# Patient Record
Sex: Male | Born: 1935
Health system: Southern US, Community
[De-identification: ages and names within clinical notes are randomized; demographics above are authoritative.]

## PROBLEM LIST (undated history)

## (undated) DIAGNOSIS — H547 Unspecified visual loss: Secondary | ICD-10-CM

## (undated) DIAGNOSIS — E119 Type 2 diabetes mellitus without complications: Secondary | ICD-10-CM

## (undated) DIAGNOSIS — I10 Essential (primary) hypertension: Secondary | ICD-10-CM

## (undated) DIAGNOSIS — F0390 Unspecified dementia without behavioral disturbance: Secondary | ICD-10-CM

## (undated) DIAGNOSIS — H409 Unspecified glaucoma: Secondary | ICD-10-CM

---

## 1999-04-22 ENCOUNTER — Encounter: Payer: Self-pay | Admitting: Cardiology

## 1999-04-22 ENCOUNTER — Ambulatory Visit (HOSPITAL_COMMUNITY): Admission: RE | Admit: 1999-04-22 | Discharge: 1999-04-22 | Payer: Self-pay | Admitting: Cardiology

## 2001-03-31 ENCOUNTER — Inpatient Hospital Stay (HOSPITAL_COMMUNITY): Admission: EM | Admit: 2001-03-31 | Discharge: 2001-04-01 | Payer: Self-pay | Admitting: Emergency Medicine

## 2001-03-31 ENCOUNTER — Encounter: Payer: Self-pay | Admitting: Emergency Medicine

## 2003-04-20 ENCOUNTER — Encounter (INDEPENDENT_AMBULATORY_CARE_PROVIDER_SITE_OTHER): Payer: Self-pay | Admitting: Specialist

## 2003-04-20 ENCOUNTER — Ambulatory Visit (HOSPITAL_COMMUNITY): Admission: RE | Admit: 2003-04-20 | Discharge: 2003-04-20 | Payer: Self-pay | Admitting: Gastroenterology

## 2004-12-06 ENCOUNTER — Encounter: Admission: RE | Admit: 2004-12-06 | Discharge: 2004-12-06 | Payer: Self-pay | Admitting: Internal Medicine

## 2004-12-07 ENCOUNTER — Encounter: Admission: RE | Admit: 2004-12-07 | Discharge: 2004-12-07 | Payer: Self-pay | Admitting: Internal Medicine

## 2006-05-08 ENCOUNTER — Encounter: Payer: Self-pay | Admitting: Vascular Surgery

## 2006-05-08 ENCOUNTER — Ambulatory Visit (HOSPITAL_COMMUNITY): Admission: RE | Admit: 2006-05-08 | Discharge: 2006-05-08 | Payer: Self-pay | Admitting: Internal Medicine

## 2006-05-08 ENCOUNTER — Ambulatory Visit: Payer: Self-pay | Admitting: Vascular Surgery

## 2006-07-27 ENCOUNTER — Emergency Department (HOSPITAL_COMMUNITY): Admission: EM | Admit: 2006-07-27 | Discharge: 2006-07-27 | Payer: Self-pay | Admitting: Emergency Medicine

## 2006-09-03 ENCOUNTER — Ambulatory Visit (HOSPITAL_COMMUNITY): Admission: RE | Admit: 2006-09-03 | Discharge: 2006-09-03 | Payer: Self-pay | Admitting: Gastroenterology

## 2006-09-03 ENCOUNTER — Encounter (INDEPENDENT_AMBULATORY_CARE_PROVIDER_SITE_OTHER): Payer: Self-pay | Admitting: Gastroenterology

## 2010-07-19 NOTE — Op Note (Signed)
NAME:  Grant Jensen, Grant Jensen                ACCOUNT NO.:  [id number]   MEDICAL RECORD NO.:  [id number]          PATIENT TYPE:  AMB   LOCATION:  ENDO                         FACILITY:  [facility]   PHYSICIAN:  Anselmo Rod, M.D.  DATE OF BIRTH:  October 08, 1928   DATE OF PROCEDURE:  09/03/2006  DATE OF DISCHARGE:                               OPERATIVE REPORT   PROCEDURE PERFORMED:  Colonoscopy with cold snare x2.   ENDOSCOPIST:  Anselmo Rod, M.D.   INSTRUMENT USED:  Pentax video colonoscope.   INDICATIONS FOR PROCEDURE:  A 77 year old [place] American male with a  history of colon cancer in his brother, undergoing screening colonoscopy  to rule out colonic polyps, masses, etc.   PREPROCEDURE PREPARATION:  Informed consent was procured from the  patient. The patient fasted for 4 hours prior to procedure and prepped  with MoviPrep the night of and the morning of the procedure.  Risks and  benefits of the procedure, including a 10% miss rate of cancer and  polyp, were discussed with the patient as well.   PREPROCEDURE PHYSICAL EXAMINATION:  VITAL SIGNS:  The patient had stable  vital signs.  NECK:  Supple.  CHEST:  Clear to auscultation.  CARDIOVASCULAR:  S1, S2 regular.  ABDOMEN:  Soft, with normal bowel sounds.   DESCRIPTION OF THE PROCEDURE:  The patient was placed in the left  lateral decubitus position and sedated with 100 mcg of Fentanyl and 10  mg of Versed given intravenously in slow incremental doses. Once the  patient was adequately sedated and maintained on low-flow oxygen and  continuous cardiac monitoring, the Pentax video colonoscope was advanced  from the rectum to the cecum with difficulty.  The patient had a  significant amount of residual stool in the colon.  Multiple washes were  done.  The appendiceal orifice and cecal valve were visualized after  changing the patient's position from the left lateral to the supine and  the right lateral position.  The terminal ileum was  not visualized. Two  small flat polyps were removed by cold snare from the cecum.  There was  no evidence of diverticulosis. Retroflexion in the rectum revealed  prominent internal hemorrhoids.  The patient tolerated the procedure  well, without complications. Small lesions could be missed secondary to  a relatively poor prep.   IMPRESSION:  1. Two small sessile polyps removed by cold snare from the cecum.  2. Prominent internal hemorrhoids.  3. Large amount of residual stool in the colon.  Small lesions could      be missed.   RECOMMENDATIONS:  1. Await pathology results.  2. Repeat colonoscopy depending on pathology results.  3. Avoid all nonsteroidals including aspirin for the next 2 weeks.  4. Outpatient followup as need arises in the future.      Anselmo Rod, M.D.  Electronically Signed     JNM/MEDQ  D:  09/03/2006  T:  09/03/2006  Job:  [id number]   cc:   Thora Lance, M.D.  Fax: [phone]

## 2010-07-22 NOTE — Discharge Summary (Signed)
[facility]  Patient:    BECKHEM, ISADORE Visit Number: [id number] MRN: [id number]          Service Type: MED Location: [phone] Attending Physician:  Eliezer Champagne Dictated by:   Wayne C. Dorna Bloom, M.D. Admit Date:  03/31/2001 Discharge Date: 04/01/2001   CC:         Wayne C. Dorna Bloom, M.D.   Discharge Summary  DISCHARGE DIAGNOSES: 1. Left sided ureteral stone. 2. Hypertension. 3. Hematuria. 4. Glaucoma. 5. Hypokalemia. 6. Anemia.  HISTORY OF PRESENT ILLNESS:  The patient is a pleasant 77 year old gentleman who was admitted on 03/31/01 after severe left lower quadrant pain radiating to the groin followed by vomiting times one with slight blood, no urinary difficulties.  The patient has a history of kidney stone 30 years ago.  Other problems include hypertension.  The patient was initially seen in the emergency room, was afebrile with vital signs fairly stable.  Abdominal CT scan revealed a left ureteral stone 3 mm, in the distal ureter, fullness in the left renal collecting system and left perinephric stranding.  HOSPITAL COURSE:  The patient was admitted for pain control, intravenous morphine after which the pain resolved.  At this point this morning the patient is not having further pain and feels great.  He has not passed the stone as far as he knows.  The patient will be discharged with Vicodin as needed for pain.  FOLLOW UP:  Patient will have follow up in the office in about two weeks and the patient already has a previously scheduled appointment.  If the patient develops any intractable pain, unrelenting vomiting or fever he is to come in sooner.  For right now we will hold off on a Urology referral but the patient may ultimately need a referral.  We will be getting full reports on the stone as well.  Other issues include hypertension.  The patient previously had been on Altace 2.5 mg q.d.  Blood pressure on admission was 155/82 and this  morning is 145/81.  Considering the patients current problems this is reasonable.  Next issue was anemia.  The patient presented with a hemoglobin of 12.5 with hematocrit 38.6.  On 04/01/01 the hemoglobin is 11.4 and hematocrit is 34.4. This is most likely a result of his hematuria but can simply be followed.  Next problem is hypokalemia.  The patient had potassium of 2.7 on admission and after intravenous fluids and potassium supplement this morning his potassium is normal at 3.7 and renal function, sodium and other electrolytes were normal. Dictated by:   Wayne C. Dorna Bloom, M.D. Attending Physician:  Eliezer Champagne DD:  04/01/01 TD:  04/01/01 Job: [id number]

## 2010-07-22 NOTE — Op Note (Signed)
NAME:  Grant Jensen, Grant Jensen                            ACCOUNT NO.:  [id number]   MEDICAL RECORD NO.:  [id number]                   PATIENT TYPE:  AMB   LOCATION:  ENDO                                 FACILITY:  [facility]   PHYSICIAN:  Anselmo Rod, M.D.               DATE OF BIRTH:  Sep 18, 1928   DATE OF PROCEDURE:  04/20/2003  DATE OF DISCHARGE:                                 OPERATIVE REPORT   PROCEDURE:  Colonoscopy with cold biopsy x1.   ENDOSCOPIST:  Anselmo Rod, M.D.   INSTRUMENT USED:  Olympus video colonoscope.   INDICATIONS FOR PROCEDURE:  This 77 year old African-American male with a  family history of colon cancer in a brother, underwent a screening  colonoscopy to rule out colonic polyps, masses, etc.   PRE-PROCEDURE PREPARATION:  An informed consent was procured from the  patient.  The patient was fasted for eight hours prior to the procedure, and  then prepped with a bottle of magnesium citrate and one gallon of GoLYTELY  on the night prior to the procedure.   PRE-PROCEDURE PHYSICAL EXAMINATION:  VITAL SIGNS:  Stable.  NECK:  Supple.  CHEST:  Clear to auscultation.  HEART:  S1, S2 regular.  ABDOMEN:  Soft with normal bowel sounds.   DESCRIPTION OF PROCEDURE:  The patient was placed in the left lateral  decubitus position and sedated with 80 mg of Demerol and 8 mg of Versed in  slow incremental doses.  Once the patient was adequately sedated and  maintained on low-flow oxygen and continuous cardiac monitoring, the Olympus  video colonoscope was advanced from the rectum to the cecum.  A small  sessile polyp was biopsied from 75 cm x1.  Small internal hemorrhoids were  seen on retroflexion.  The rest of the colonic mucosa and cecum appeared  normal.  There was some residual stool in the colon.  Small lesions could  have been missed.   IMPRESSION:  1. Small non-bleeding internal hemorrhoids.  2. Small sessile polyp, biopsied at 75 cm.  Otherwise an unrevealing  colonoscopy.   RECOMMENDATIONS:  1. Await the pathology results.  2. Avoid all non-steroidals including aspirin for the next two weeks.  3. Outpatient followup in the next two weeks for further recommendations.  4. Repeat CRC screening, depending upon the pathology results.                                               Anselmo Rod, M.D.    JNM/MEDQ  D:  04/20/2003  T:  04/20/2003  Job:  [id number]   cc:   Lilla Shook, M.D.  [address], [address]  [place]  [place] [address]  Fax: [phone]

## 2012-01-18 ENCOUNTER — Other Ambulatory Visit: Payer: Self-pay | Admitting: Internal Medicine

## 2012-01-18 DIAGNOSIS — R531 Weakness: Secondary | ICD-10-CM

## 2012-01-22 ENCOUNTER — Ambulatory Visit
Admission: RE | Admit: 2012-01-22 | Discharge: 2012-01-22 | Disposition: A | Discharge status: Home / Self Care | Payer: Medicare Other | Source: Ambulatory Visit | Attending: Internal Medicine | Admitting: Internal Medicine

## 2012-01-22 DIAGNOSIS — R531 Weakness: Secondary | ICD-10-CM

## 2012-12-19 ENCOUNTER — Ambulatory Visit
Admission: RE | Admit: 2012-12-19 | Discharge: 2012-12-19 | Disposition: A | Discharge status: Home / Self Care | Payer: Medicare PPO | Source: Ambulatory Visit | Attending: Internal Medicine | Admitting: Internal Medicine

## 2012-12-19 ENCOUNTER — Other Ambulatory Visit: Payer: Self-pay | Admitting: Internal Medicine

## 2012-12-19 DIAGNOSIS — E119 Type 2 diabetes mellitus without complications: Secondary | ICD-10-CM

## 2014-05-08 ENCOUNTER — Encounter (HOSPITAL_COMMUNITY): Payer: Self-pay | Admitting: Emergency Medicine

## 2014-05-08 ENCOUNTER — Emergency Department (HOSPITAL_COMMUNITY): Payer: Medicare PPO

## 2014-05-08 ENCOUNTER — Emergency Department (HOSPITAL_COMMUNITY)
Admission: EM | Admit: 2014-05-08 | Discharge: 2014-05-08 | Discharge status: Against Medical Advice | Payer: Medicare PPO | Attending: Emergency Medicine | Admitting: Emergency Medicine

## 2014-05-08 DIAGNOSIS — Z79899 Other long term (current) drug therapy: Secondary | ICD-10-CM | POA: Insufficient documentation

## 2014-05-08 DIAGNOSIS — R0602 Shortness of breath: Secondary | ICD-10-CM | POA: Insufficient documentation

## 2014-05-08 DIAGNOSIS — R079 Chest pain, unspecified: Secondary | ICD-10-CM | POA: Insufficient documentation

## 2014-05-08 DIAGNOSIS — E119 Type 2 diabetes mellitus without complications: Secondary | ICD-10-CM | POA: Insufficient documentation

## 2014-05-08 DIAGNOSIS — Z8669 Personal history of other diseases of the nervous system and sense organs: Secondary | ICD-10-CM | POA: Diagnosis not present

## 2014-05-08 DIAGNOSIS — Z7982 Long term (current) use of aspirin: Secondary | ICD-10-CM | POA: Diagnosis not present

## 2014-05-08 DIAGNOSIS — R6883 Chills (without fever): Secondary | ICD-10-CM | POA: Insufficient documentation

## 2014-05-08 HISTORY — DX: Unspecified glaucoma: H40.9

## 2014-05-08 HISTORY — DX: Type 2 diabetes mellitus without complications: E11.9

## 2014-05-08 LAB — BASIC METABOLIC PANEL
ANION GAP: 10 (ref 5–15)
BUN: 9 mg/dL (ref 6–23)
CO2: 27 mmol/L (ref 19–32)
Calcium: 9.3 mg/dL (ref 8.4–10.5)
Chloride: 101 mmol/L (ref 96–112)
Creatinine, Ser: 1.17 mg/dL (ref 0.50–1.35)
GFR calc Af Amer: 64 mL/min — ABNORMAL LOW (ref >90)
GFR, EST NON AFRICAN AMERICAN: 55 mL/min — AB (ref >90)
Glucose, Bld: 139 mg/dL — ABNORMAL HIGH (ref 70–99)
POTASSIUM: 3.7 mmol/L (ref 3.5–5.1)
SODIUM: 138 mmol/L (ref 135–145)

## 2014-05-08 LAB — I-STAT TROPONIN, ED: Troponin i, poc: 0.01 ng/mL (ref 0.00–0.08)

## 2014-05-08 LAB — CBC
HCT: 36.6 % — ABNORMAL LOW (ref 39.0–52.0)
HEMOGLOBIN: 11.9 g/dL — AB (ref 13.0–17.0)
MCH: 27 pg (ref 26.0–34.0)
MCHC: 32.5 g/dL (ref 30.0–36.0)
MCV: 83 fL (ref 78.0–100.0)
Platelets: 188 10*3/uL (ref 150–400)
RBC: 4.41 MIL/uL (ref 4.22–5.81)
RDW: 14.7 % (ref 11.5–15.5)
WBC: 6.7 10*3/uL (ref 4.0–10.5)

## 2014-05-08 MED ORDER — ASPIRIN 81 MG PO CHEW
324.0000 mg | CHEWABLE_TABLET | Freq: Once | ORAL | Status: AC
  Administered 2014-05-08: 324 mg via ORAL
  Filled 2014-05-08: qty 4

## 2014-05-08 NOTE — ED Notes (Signed)
Pt aware of need for urine specimen. 

## 2014-05-08 NOTE — ED Provider Notes (Signed)
CSN: [id number]     Arrival date & time 05/08/14  1640 History   First MD Initiated Contact with Patient 05/08/14 1910     Chief Complaint  Patient presents with  . Chest Pain     (Consider location/radiation/quality/duration/timing/severity/associated sxs/prior Treatment) Patient is a 81 y.o. male presenting with chest pain. The history is provided by the patient and the spouse. No language interpreter was used.  Chest Pain Associated symptoms: shortness of breath   Associated symptoms: no abdominal pain, no cough, no dizziness, no fever, no headache, no nausea, not vomiting and no weakness   Mr. Aden is a 81 y/o black male who has a history of diabetes and presents today for sharp, acute onset chest pain, shortness of breath, and diaphoresis that began 5 days ago. His wife states the pain was 10/10 when it began but has subsided slightly each day. He was at his PCP, Dr. [name] today with continued chest pain so she sent him to the ED. Nothing makes the pain better or worse. He has not taken his aspirin today. He denies any nausea, vomiting, abdominal pain, leg swelling.  Past Medical History  Diagnosis Date  . Glaucoma   . Diabetes mellitus without complication    History reviewed. No pertinent past surgical history. No family history on file. History  Substance Use Topics  . Smoking status: Never Smoker   . Smokeless tobacco: Not on file  . Alcohol Use: Yes    Review of Systems  Constitutional: Positive for chills. Negative for fever.  Respiratory: Positive for shortness of breath. Negative for cough.   Cardiovascular: Positive for chest pain.  Gastrointestinal: Negative for nausea, vomiting, abdominal pain and blood in stool.  Neurological: Negative for dizziness, syncope, weakness, light-headedness and headaches.  All other systems reviewed and are negative.     Allergies  Lisinopril  Home Medications   Prior to Admission medications   Medication Sig Start Date  End Date Taking? Authorizing Provider  aspirin EC 81 MG tablet Take 81 mg by mouth daily.   Yes Historical Provider, MD  metFORMIN (GLUCOPHAGE) 500 MG tablet Take 500 mg by mouth daily with breakfast.   Yes Historical Provider, MD  omeprazole (PRILOSEC) 20 MG capsule Take 20 mg by mouth daily.   Yes Historical Provider, MD   BP 148/80 mmHg  Pulse 83  Temp(Src) 98.4 F (36.9 C)  Resp 15  Ht  (1.626 m)  Wt 160 lb (72.576 kg)  BMI 27.45 kg/m2  SpO2 100% Physical Exam  Constitutional: He is oriented to person, place, and time. He appears well-developed and well-nourished.  HENT:  Head: Normocephalic and atraumatic.  Right Ear: Tympanic membrane and external ear normal.  Left Ear: Tympanic membrane and external ear normal.  Mouth/Throat: Oropharynx is clear and moist and mucous membranes are normal.  Eyes: Conjunctivae are normal.  Neck: Normal range of motion. Neck supple.  Cardiovascular: Normal rate, regular rhythm and normal heart sounds.   Pulmonary/Chest: Effort normal and breath sounds normal. No respiratory distress. He has no decreased breath sounds. He has no wheezes. He has no rales.  No reproducible chest pain.  Abdominal: Soft. There is no tenderness.  Musculoskeletal: Normal range of motion.  Neurological: He is alert and oriented to person, place, and time.  Skin: Skin is warm and dry.  Nursing note and vitals reviewed.   ED Course  Procedures (including critical care time) Labs Review Labs Reviewed  CBC - Abnormal; Notable for the following:  Hemoglobin 11.9 (*)    HCT 36.6 (*)    All other components within normal limits  BASIC METABOLIC PANEL - Abnormal; Notable for the following:    Glucose, Bld 139 (*)    GFR calc non Af Amer 55 (*)    GFR calc Af Amer 64 (*)    All other components within normal limits  URINALYSIS, ROUTINE W REFLEX MICROSCOPIC  I-STAT TROPOININ, ED    Imaging Review Dg Chest 2 View  05/08/2014   CLINICAL DATA:  Chest pain for  4 days.  Initial encounter.  EXAM: CHEST  2 VIEW  COMPARISON:  12/09/2012.  FINDINGS: Cardiopericardial silhouette within normal limits. Unchanged tortuous thoracic aorta. Bilateral glenohumeral osteoarthritis incidentally noted. Trachea midline. No airspace disease or effusion.  IMPRESSION: No active cardiopulmonary disease.   Electronically Signed   By: Andreas Newport M.D.   On: 05/08/2014 18:11     EKG Interpretation   Date/Time:  Friday May 08 2014 16:43:23 EST Ventricular Rate:  91 PR Interval:  162 QRS Duration: 160 QT Interval:  402 QTC Calculation: 494 R Axis:   -58 Text Interpretation:  Normal sinus rhythm Left ventricular hypertrophy  with QRS widening and repolarization abnormality Abnormal ECG Confirmed by  Lincoln Brigham [phone]) on 05/08/2014 8:13:51 PM      MDM   Final diagnoses:  None  CBC and BMP is within normal limits. Troponin negative.   CXR shows no acute abnormality. EKG shows no acute coronary syndrome. Vitals are normal.  The patient has never had a stress test or ECHO.  He has no cardiac history but he is diabetic with a family history of heart disease.  Dr. Madilyn Hook and I checked the HEART score for major cardiac events and recommended the patient be admitted.    20:24 Spoke to patient and wife regarding admission and patient does not want to stay.  Thoroughly discussed the risks of having a cardiac event including death.  Dr. Madilyn Hook also thoroughly discussed the risks of leaving and the patient would like to leave AMA.  He was told that he could return at any time and to definitely return for chest pain, shortness of breath, or abdominal pain.  He is to call cardiology tomorrow for an appointment and take aspirin 81mg  daily.       Catha Gosselin, PA-C 05/09/14 1414  Tilden Fossa, MD 05/09/14 1452

## 2014-05-08 NOTE — ED Notes (Signed)
Pt returned to lobby from having xray completed.

## 2014-05-08 NOTE — Discharge Instructions (Signed)
Chest Pain (Nonspecific) °It is often hard to give a specific diagnosis for the cause of chest pain. There is always a chance that your pain could be related to something serious, such as a heart attack or a blood clot in the lungs. You need to follow up with your health care provider for further evaluation. °CAUSES  °· Heartburn. °· Pneumonia or bronchitis. °· Anxiety or stress. °· Inflammation around your heart (pericarditis) or lung (pleuritis or pleurisy). °· A blood clot in the lung. °· A collapsed lung (pneumothorax). It can develop suddenly on its own (spontaneous pneumothorax) or from trauma to the chest. °· Shingles infection (herpes zoster virus). °The chest wall is composed of bones, muscles, and cartilage. Any of these can be the source of the pain. °· The bones can be bruised by injury. °· The muscles or cartilage can be strained by coughing or overwork. °· The cartilage can be affected by inflammation and become sore (costochondritis). °DIAGNOSIS  °Lab tests or other studies may be needed to find the cause of your pain. Your health care provider may have you take a test called an ambulatory electrocardiogram (ECG). An ECG records your heartbeat patterns over a 24-hour period. You may also have other tests, such as: °· Transthoracic echocardiogram (TTE). During echocardiography, sound waves are used to evaluate how blood flows through your heart. °· Transesophageal echocardiogram (TEE). °· Cardiac monitoring. This allows your health care provider to monitor your heart rate and rhythm in real time. °· Holter monitor. This is a portable device that records your heartbeat and can help diagnose heart arrhythmias. It allows your health care provider to track your heart activity for several days, if needed. °· Stress tests by exercise or by giving medicine that makes the heart beat faster. °TREATMENT  °· Treatment depends on what may be causing your chest pain. Treatment may include: °¨ Acid blockers for  heartburn. °¨ Anti-inflammatory medicine. °¨ Pain medicine for inflammatory conditions. °¨ Antibiotics if an infection is present. °· You may be advised to change lifestyle habits. This includes stopping smoking and avoiding alcohol, caffeine, and chocolate. °· You may be advised to keep your head raised (elevated) when sleeping. This reduces the chance of acid going backward from your stomach into your esophagus. °Most of the time, nonspecific chest pain will improve within 2-3 days with rest and mild pain medicine.  °HOME CARE INSTRUCTIONS  °· If antibiotics were prescribed, take them as directed. Finish them even if you start to feel better. °· For the next few days, avoid physical activities that bring on chest pain. Continue physical activities as directed. °· Do not use any tobacco products, including cigarettes, chewing tobacco, or electronic cigarettes. °· Avoid drinking alcohol. °· Only take medicine as directed by your health care provider. °· Follow your health care provider's suggestions for further testing if your chest pain does not go away. °· Keep any follow-up appointments you made. If you do not go to an appointment, you could develop lasting (chronic) problems with pain. If there is any problem keeping an appointment, call to reschedule. °SEEK MEDICAL CARE IF:  °· Your chest pain does not go away, even after treatment. °· You have a rash with blisters on your chest. °· You have a fever. °SEEK IMMEDIATE MEDICAL CARE IF:  °· You have increased chest pain or pain that spreads to your arm, neck, jaw, back, or abdomen. °· You have shortness of breath. °· You have an increasing cough, or you cough   up blood.  You have severe back or abdominal pain.  You feel nauseous or vomit.  You have severe weakness.  You faint.  You have chills. This is an emergency. Do not wait to see if the pain will go away. Get medical help at once. Call your local emergency services (911 in [place] Do not drive  yourself to the hospital. MAKE SURE YOU:   Understand these instructions.  Will watch your condition.  Will get help right away if you are not doing well or get worse. Document Released: 11/30/2004 Document Revised: 02/25/2013 Document Reviewed: 09/26/2007 [facility] Patient Information 2015 [place], [place]. This information is not intended to replace advice given to you by your health care provider. Make sure you discuss any questions you have with your health care provider.  Be sure to call cardiology, Dr. Jacinto Halim tomorrow for f/u. Take a baby aspirin 81mg  daily. Return to the ED for chest pain, shortness of breath, abdominal pain, or any other reason.

## 2014-05-08 NOTE — ED Notes (Signed)
Pt presents with intermittent central chest pain onset Monday, admits to shortness of breath at times.  Pt was seen by PCP prior to arrival and instructed to come to ED for evaluation.  Pt denies pain at present.  No acute distress noted.

## 2014-06-20 DIAGNOSIS — R079 Chest pain, unspecified: Secondary | ICD-10-CM

## 2014-06-20 NOTE — H&P (Signed)
OFFICE NOTE COPIED TO EPIC  Grant Jensen 06/09/2014 2:30 PM Location: [facility] Patient #: [id number] DOB: 06/09/1928 Married / Language: English / Race: Refused to Report/Unreported Male    History of Present Illness(Grant Donetta Potts, MD; 06/09/2014 9:03 PM) Patient words: f/u nuc and echo; Pt states that he has been getting a headache after he takes Metoprolol. Pt says headache does not last too long. Pt denies all other symptoms. Pt states he did not get the Isosorbide from the pharmacy so pt. has not been taking it.  The patient is a 81 year old male who presents for a follow-up for Chest pain. Grant Jensen is 81 years old African-American male. He gives history of chest pain off and on for past few months. Pain is felt in the precordial region and sometimes radiates across the anterior chest. He denies radiation to the arms, neck or back of the chest. It is associated with shortness of breath. The pain subsides in few minutes. On 05/04/2014, he had similar but quite severe pain. It was associated with diaphoresis and shortness of breath. He did not go to any doctor that day. The patient saw his PCP four days later. Patient's history was concerning for severe ischemia and thus he was referred to emergency room. I have reviewed the ER records. His troponin I was normal at 0.01. The patient was advised hospital admission for further management but he had refused and signed out AMA. He had occasional mild chest pain since last visit, did not take nitroglycerin any time.  Patient denies any shortness of breath but his activities are limited. He does not go out of the home except to his mailbox which is about 20 yards and he is comfortable walking that distance. No orthopnea or PND. No complaints of palpitation, dizziness, near syncope or syncope. No history of swelling on the legs and no claudication. He has history of GERD.  No history of hypertension.  He has diabetes mellitus type 2. Patient says that his cholesterol was told to be normal. He does not smoke but, has been chewing tobacco for many years.  No history of MI in the past and no known history of CAD. No history of thyroid problems. No history of TIA or CVA.     Problem List/Past Medical([name]; 06/09/2014 1:47 PM) Central obesity (E65) Angina pectoris (I20.9) Glaucoma (H40.9) Controlled type 2 diabetes mellitus (E11.9) GERD (gastroesophageal reflux disease) (K21.9)    Allergies([name]; 06/09/2014 1:47 PM) Lisinopril *ANTIHYPERTENSIVES*. Cough.    Family History([name]; 06/09/2014 1:47 PM) Sister 1. younger Father. Deceased. at age 67, from Natural Causes. No known Heart conditions prior Brother 3. all deceased Mother. Deceased. at age 35, from natural causes. No known Heart conditions prior    Social History([name]; 06/09/2014 1:47 PM) Current tobacco use. Uses chewing tobacco. since age 54 Alcohol Use. Moderate alcohol use. Living Situation. Lives with spouse. Marital status. Married. Number of Children. 5    Past Surgical History([name]; 06/09/2014 1:47 PM) Cataract Extraction-Bilateral. 2001    Medication History([name]; 06/09/2014 1:55 PM) Medications Reconciled. Nitrostat (0.4MG  Tab Sublingual, 1 (one) Tab Sublingual Sublingual every 5 minutes as needed for chest pain., Taken starting 05/12/2014) Active. Metoprolol Succinate ER (  Tablet ER 24HR, 1 (one) Tablet ER 24HR Oral daily, Taken starting 05/12/2014) Active. Isosorbide Mononitrate ER (  Tablet ER 24HR, 1 (one) Tablet ER 24HR Oral daily, Taken 05/12/2014 to 05/12/2014) Discontinued. (Pt never received from the pharmacy.) Aspirin EC Low Dose (   Tablet DR, 1 (one) Tablet DR Oral daily, Taken starting 05/12/2014) Active. MetFORMIN HCl (  Tablet, 1 Oral daily) Active. Omeprazole (  Capsule DR, 1 Oral daily)  Active.    Diagnostic Studies History([name]; 06/09/2014 1:47 PM) Echocardiogram. 06/03/2014 1. Left ventricle cavity is normal in size. Moderate global hypokinesis. Doppler evidence of grade I (impaired) diastolic dysfunction. Calculated EF 30%. 2. Sclerosis of the aortic valve. Trileaflet aortic valve with no regurgitation noted. 3. Trace mitral regurgitation. Mild calcification of the mitral valve annulus. Structurally normal tricuspid valve with trace regurgitation. No evidence of pulmonary hypertension. Nuclear stress test. 05/22/2014 1. The resting electrocardiogram demonstrated normal sinus rhythm and RBBB. LAD, LAFB. Anterior infarct, old. LVH.Stress EKG is non-diagnostic for ischemia as it a pharmacologic stress using Lexiscan. Stress symptoms included dyspnea, dizziness. 2. Myocardial perfusion imaging is abnormal. There is a moderate area of ischemia in the basal inferior and mid inferior myocardial wall(s). Gated SPECT imaging demonstrates hypokinesis of the basal inferior and mid inferior myocardial wall(s). The left ventricular ejection fraction was calculated or visually estimated to be 47%. This represents at least an intermediate risk scan. Colonoscopy. 2012 Removed benign polyps Endoscopy. 2012 Normal    Review of Systems(Grant Donetta Potts, MD; 06/09/2014 9:03 PM)   Note: GENERAL- Feels tired, No fever, chills. No recent weight change. CARDIO VASCULAR- Has chest pain associated with shortness of breath, No orthopnea or PND. No palpitation, dizziness, fainting. No hypertension. No h/o high cholesterol. No swelling on legs. No claudication in legs, No cramps. No h/o DVT PULMONARY- No cough, phlegm, wheezing, not feeling congested in chest. GASTROINTESTINAL- No abdominal pain, nausea, vomiting or diarrhea. No dark tarry stools. Normal appetite. Has heartburn. No jaundice. ENDOCRINE- No Thyroid problem, No feeling of excessive heat or cold, No polydipsia or  polyuria. Has Diabetes. NEUROLOGICAL- No focal motor or sensory symptoms, Good coordination. No seizures. MUSCULOSKELETAL- No generalized myalgias or muscle weakness. No joint swelling SKIN- No skin rash, No pruritus HEMATOLOGY- No anemia, petechiae, excessive bruising, epistaxis, GI bleed or any abnormal bleeding.    Vitals([name]; 06/09/2014 1:58 PM) 06/09/2014 1:47 PM Weight: 166 lb Height: 63 in Body Surface Area: 1.83 m Body Mass Index: 29.41 kg/m Pulse: 80 (Regular) P.OX: 99% (Room air) BP: 132/80 (Sitting, Left Arm, Standard)     Physical Exam(Grant Donetta Potts, MD; 06/09/2014 3:23 PM) The physical exam findings are as follows:  Note: GENERAL APPEARANCE- Alert, Oriented. Has central obesity. HEENT- Unremarkable, fundi were not examined. Pt. is edentulous. NECK- No JVD. Carotid pulses are 2+, No bruits audible. No thyromegaly. No lymphadenopathy. HEART- Palpation- Apex beat is palpable in left 5th intercostal space, inside midclavicular line. No heave or thrills palpable. Auscultation- Normal S1, S2. No gallops. Gr. 2/6 ESM is audible in aortic area.. CHEST- Normal shape. Normal percussion. Auscultation- Normal breath sounds, No crepitations. No wheezing. ABDOMEN- Obese. Palpation- Soft, Nontender. No hepatosplenomegaly. No masses felt. Auscultation- Normal bowel sounds. No bruits audible. EXTREMITIES- No Clubbing or Cyanosis. No edema on legs or feet. PERIPHERAL PULSES- Both femoral pulses- 2+, No bruits audible. Both dorsalis pedis pulses- 2+, Both posterior tibial pulses- 2+    Assessment & Plan(Grant Donetta Potts, MD; 06/11/2014 12:54 PM) Angina pectoris (I20.9) Story: Nuclear stress test. 05/22/2014 1. The resting electrocardiogram demonstrated normal sinus rhythm and RBBB. LAD, LAFB. Anterior infarct, old. LVH.Stress EKG is non-diagnostic for ischemia as it a pharmacologic stress using Lexiscan. Stress symptoms included dyspnea, dizziness. 2.  Myocardial perfusion imaging is abnormal. There is a moderate area of  ischemia in the basal inferior and mid inferior myocardial wall(s). Gated SPECT imaging demonstrates hypokinesis of the basal inferior and mid inferior myocardial wall(s). The left ventricular ejection fraction was calculated or visually estimated to be 47%. This represents at least an intermediate risk scan. Current Plans l Started Atorvastatin Calcium 20MG , 1 (one) Tablet daily, #30, 30 days starting 06/09/2014, Ref. x2. l LIPID PANEL (39030) l METABOLIC PANEL, COMPREHENSIVE (80053) l CBC & PLATELETS (AUTO) (09233) l Pt Education - How to access health information online: discussed with patient and provided information. l Patient screened for tobacco use and received tobacco cessation intervention (counseling, pharmacotherapy, or both), if identified as a tobacco user (PV, CAD) (4004F) l Oral antiplatelet therapy prescribed (CAD) (4011F)  Abnormal myocardial perfusion study (R94.39)  Cardiomyopathy (I42.9) Story: Echocardiogram. 06/03/2014 1. Left ventricle cavity is normal in size. Moderate global hypokinesis. Doppler evidence of grade I (impaired) diastolic dysfunction. Calculated EF 30%. 2. Sclerosis of the aortic valve. Trileaflet aortic valve with no regurgitation noted. 3. Trace mitral regurgitation. Mild calcification of the mitral valve annulus. Structurally normal tricuspid valve with trace regurgitation. No evidence of pulmonary hypertension. Current Plans l Started Losartan Potassium 25MG , 1 (one) Tablet daily, #30, 30 days starting 06/09/2014, Ref. x3.  Encounter for preprocedural cardiovascular examination (Z01.810) Story: Cath 06/22/2014 Current Plans l PT (PROTHROMBIN TIME) (00762)  Chewing tobacco use (Z72.0) Current Plans l Counseling about tobacco use for 1 to 3 minutes (26333)  Note: Results of Lexiscan Myoview scan and echocardiogram were  explained to the patient and his wife. He has inf. wall ischemia by the stress nuclear scans and also has cardiomyopathy. EF by echo. was 30%.  I have recommended cardiac catheterization. We discussed regarding risks, benefits, and alternative of continued medical therapy. Patient wants to proceed. Understands <1-2% risk of death, stroke, MI, urgent CABG, bleeding, infection, renal failure but not limited to these. Pt. verbalized understanding, has been scheduled for cardiac catheterization, and possible angioplasty/stent implant by Dr. Jacinto Halim.  Patient was advised to hold metformin from the day before catheterization.   He will continue metoprolol, low-dose aspirin, and nitroglycerin prn. I have added losartan (patient had cough with lisinopril in the past) and also statin therapy with atorvastatin 20 mg daily.  Primary prevention was again discussed. Patient was advised to follow strict ADA, low-salt, low-cholesterol diet. He was also advised to stop chewing tobacco.  I will see him in follow-up after cardiac cath. and any intervention as needed.  CC: Dr. [name].   Signed electronically by Earl Many, MD (06/11/2014 12:55 PM)

## 2014-06-22 ENCOUNTER — Encounter (HOSPITAL_COMMUNITY): Payer: Self-pay | Admitting: Cardiology

## 2014-06-22 ENCOUNTER — Encounter (HOSPITAL_COMMUNITY)
Admission: RE | Disposition: A | Discharge status: Home / Self Care | Payer: Self-pay | Source: Ambulatory Visit | Attending: Cardiology

## 2014-06-22 ENCOUNTER — Ambulatory Visit (HOSPITAL_COMMUNITY)
Admission: RE | Admit: 2014-06-22 | Discharge: 2014-06-22 | Disposition: A | Discharge status: Home / Self Care | Payer: Medicare PPO | Source: Ambulatory Visit | Attending: Cardiology | Admitting: Cardiology

## 2014-06-22 DIAGNOSIS — Z888 Allergy status to other drugs, medicaments and biological substances status: Secondary | ICD-10-CM | POA: Insufficient documentation

## 2014-06-22 DIAGNOSIS — I10 Essential (primary) hypertension: Secondary | ICD-10-CM | POA: Insufficient documentation

## 2014-06-22 DIAGNOSIS — I38 Endocarditis, valve unspecified: Secondary | ICD-10-CM | POA: Diagnosis not present

## 2014-06-22 DIAGNOSIS — E119 Type 2 diabetes mellitus without complications: Secondary | ICD-10-CM | POA: Diagnosis not present

## 2014-06-22 DIAGNOSIS — E785 Hyperlipidemia, unspecified: Secondary | ICD-10-CM | POA: Insufficient documentation

## 2014-06-22 DIAGNOSIS — R079 Chest pain, unspecified: Secondary | ICD-10-CM | POA: Diagnosis present

## 2014-06-22 DIAGNOSIS — I209 Angina pectoris, unspecified: Secondary | ICD-10-CM | POA: Diagnosis not present

## 2014-06-22 DIAGNOSIS — I429 Cardiomyopathy, unspecified: Secondary | ICD-10-CM | POA: Insufficient documentation

## 2014-06-22 DIAGNOSIS — K219 Gastro-esophageal reflux disease without esophagitis: Secondary | ICD-10-CM | POA: Diagnosis not present

## 2014-06-22 DIAGNOSIS — E65 Localized adiposity: Secondary | ICD-10-CM | POA: Insufficient documentation

## 2014-06-22 DIAGNOSIS — F17210 Nicotine dependence, cigarettes, uncomplicated: Secondary | ICD-10-CM | POA: Insufficient documentation

## 2014-06-22 DIAGNOSIS — I340 Nonrheumatic mitral (valve) insufficiency: Secondary | ICD-10-CM | POA: Diagnosis not present

## 2014-06-22 DIAGNOSIS — Z7982 Long term (current) use of aspirin: Secondary | ICD-10-CM | POA: Insufficient documentation

## 2014-06-22 HISTORY — PX: LEFT HEART CATHETERIZATION WITH CORONARY ANGIOGRAM: SHX5451

## 2014-06-22 LAB — GLUCOSE, CAPILLARY: Glucose-Capillary: 133 mg/dL — ABNORMAL HIGH (ref 70–99)

## 2014-06-22 SURGERY — LEFT HEART CATHETERIZATION WITH CORONARY ANGIOGRAM
Anesthesia: LOCAL

## 2014-06-22 MED ORDER — SODIUM CHLORIDE 0.9 % IJ SOLN: 3.0000 mL | Freq: Two times a day (BID) | INTRAMUSCULAR | Status: DC

## 2014-06-22 MED ORDER — METFORMIN HCL 500 MG PO TABS: 500.0000 mg | ORAL_TABLET | Freq: Every day | ORAL | Status: DC

## 2014-06-22 MED ORDER — FENTANYL CITRATE (PF) 100 MCG/2ML IJ SOLN
INTRAMUSCULAR | Status: AC
  Filled 2014-06-22: qty 2

## 2014-06-22 MED ORDER — SODIUM CHLORIDE 0.9 % IV SOLN: 250.0000 mL | INTRAVENOUS | Status: DC | PRN

## 2014-06-22 MED ORDER — MIDAZOLAM HCL 2 MG/2ML IJ SOLN
INTRAMUSCULAR | Status: AC
  Filled 2014-06-22: qty 2

## 2014-06-22 MED ORDER — LIDOCAINE HCL (PF) 1 % IJ SOLN
INTRAMUSCULAR | Status: AC
Start: 2014-06-22 — End: 2014-06-22
  Filled 2014-06-22: qty 30

## 2014-06-22 MED ORDER — SODIUM CHLORIDE 0.9 % IV SOLN
INTRAVENOUS | Status: DC
  Administered 2014-06-22: 06:00:00 via INTRAVENOUS

## 2014-06-22 MED ORDER — SODIUM CHLORIDE 0.9 % IJ SOLN: 3.0000 mL | INTRAMUSCULAR | Status: DC | PRN

## 2014-06-22 MED ORDER — HEPARIN SODIUM (PORCINE) 1000 UNIT/ML IJ SOLN
INTRAMUSCULAR | Status: AC
  Filled 2014-06-22: qty 1

## 2014-06-22 MED ORDER — ASPIRIN 81 MG PO CHEW: 81.0000 mg | CHEWABLE_TABLET | ORAL | Status: DC

## 2014-06-22 MED ORDER — NITROGLYCERIN 1 MG/10 ML FOR IR/CATH LAB
INTRA_ARTERIAL | Status: AC
  Filled 2014-06-22: qty 10

## 2014-06-22 MED ORDER — VERAPAMIL HCL 2.5 MG/ML IV SOLN
INTRAVENOUS | Status: AC
  Filled 2014-06-22: qty 2

## 2014-06-22 MED ORDER — ASPIRIN 81 MG PO CHEW
CHEWABLE_TABLET | ORAL | Status: AC
  Administered 2014-06-22: 81 mg
  Filled 2014-06-22: qty 1

## 2014-06-22 MED ORDER — HEPARIN (PORCINE) IN NACL 2-0.9 UNIT/ML-% IJ SOLN
INTRAMUSCULAR | Status: AC
  Filled 2014-06-22: qty 1000

## 2014-06-22 MED ORDER — SODIUM CHLORIDE 0.9 % IV SOLN: 1.0000 mL/kg/h | INTRAVENOUS | Status: DC

## 2014-06-22 NOTE — Interval H&P Note (Signed)
History and Physical Interval Note:  06/22/2014 7:46 AM  Grant Jensen.  has presented today for surgery, with the diagnosis of angina, abnormal stress test  The various methods of treatment have been discussed with the patient and family. After consideration of risks, benefits and other options for treatment, the patient has consented to  Procedure(s): LEFT HEART CATHETERIZATION WITH CORONARY ANGIOGRAM (N/A) and possible PCI as a surgical intervention . The stress although intermediate risk, given low EF and DM, multivessel involvement not excluded and is high risk stress. The patient's history has been reviewed, patient examined, no change in status, stable for surgery.  I have reviewed the patient's chart and labs.  Questions were answered to the patient's satisfaction.   Ischemic Symptoms? CCS II (Slight limitation of ordinary activity) Anti-ischemic Medical Therapy? Maximal Medical Therapy (2 or more classes of medications) Non-invasive Test Results? High-risk stress test findings: cardiac mortality >3%/yr Prior CABG? No Previous CABG   Patient Information:   1-2V CAD, no prox LAD  A (8)  Indication: 19; Score: 8   Patient Information:   CTO of 1 vessel, no other CAD  A (7)  Indication: 29; Score: 7   Patient Information:   1V CAD with prox LAD  A (9)  Indication: 35; Score: 9   Patient Information:   2V-CAD with prox LAD  A (9)  Indication: 41; Score: 9   Patient Information:   3V-CAD without LMCA  A (9)  Indication: 47; Score: 9   Patient Information:   3V-CAD without LMCA With Abnormal LV systolic function  A (9)  Indication: 48; Score: 9   Patient Information:   LMCA-CAD  A (9)  Indication: 49; Score: 9   Patient Information:   2V-CAD with prox LAD PCI  A (7)  Indication: 62; Score: 7   Patient Information:   2V-CAD with prox LAD CABG  A (8)  Indication: 62; Score: 8   Patient Information:   3V-CAD without LMCA With Low CAD  burden(i.e., 3 focal stenoses, low SYNTAX score) PCI  A (7)  Indication: 63; Score: 7   Patient Information:   3V-CAD without LMCA With Low CAD burden(i.e., 3 focal stenoses, low SYNTAX score) CABG  A (9)  Indication: 63; Score: 9   Patient Information:   3V-CAD without LMCA E06c - Intermediate-high CAD burden (i.e., multiple diffuse lesions, presence of CTO, or high SYNTAX score) PCI  U (4)  Indication: 64; Score: 4   Patient Information:   3V-CAD without LMCA E06c - Intermediate-high CAD burden (i.e., multiple diffuse lesions, presence of CTO, or high SYNTAX score) CABG  A (9)  Indication: 64; Score: 9   Patient Information:   LMCA-CAD With Isolated LMCA stenosis  PCI  U (6)  Indication: 65; Score: 6   Patient Information:   LMCA-CAD With Isolated LMCA stenosis  CABG  A (9)  Indication: 65; Score: 9   Patient Information:   LMCA-CAD Additional CAD, low CAD burden (i.e., 1- to 2-vessel additional involvement, low SYNTAX score) PCI  U (5)  Indication: 66; Score: 5   Patient Information:   LMCA-CAD Additional CAD, low CAD burden (i.e., 1- to 2-vessel additional involvement, low SYNTAX score) CABG  A (9)  Indication: 66; Score: 9   Patient Information:   LMCA-CAD Additional CAD, intermediate-high CAD burden (i.e., 3-vessel involvement, presence of CTO, or high SYNTAX score) PCI  I (3)  Indication: 67; Score: 3   Patient Information:   LMCA-CAD Additional CAD, intermediate-high  CAD burden (i.e., 3-vessel involvement, presence of CTO, or high SYNTAX score) CABG  A (9)  Indication: 67; Score: 9    Grant Jensen,Grant Jensen

## 2014-06-22 NOTE — CV Procedure (Signed)
Procedure performed:  Left heart catheterization including hemodynamic monitoring of the left ventricle, selective right and left coronary arteriography.  Indication patient is a 81 year-old AA male with history of hypertension,  hyperlipidemia,  Diabetes Mellitus   who presents with chest pain. Patient has  had non invasive testing which was abnormal.  Hence is brought to the cardiac catheterization lab to evaluate the  coronary anatomy for definitive diagnosis of CAD.  Hemodynamic data:  Left ventricular pressure was 118/0 with LVEDP of 5 mm mercury. Aortic pressure was 122/70 with a mean of 93 mm mercury. There was no pressure gradient across the aortic valve  Left ventricle: Not Performed to preserve contrast.  Right coronary artery: The vessel is smooth, normal,  Dominant.  Left main coronary artery is large and normal.  Circumflex coronary artery: A large vessel giving origin to a large obtuse marginal 1. It is smooth and normal.   LAD:  LAD gives origin to a large diagonal-1. Normal.  Impression: Findings consistent with non-ischemic cardiomyopathy.  Technique: Under sterile precautions using a 6 French right radial  arterial access, a 6 French sheath was introduced into the right radial artery. A 5 [place] Tig 4 catheter was advanced into the ascending aorta selective  right coronary artery and left coronary artery was cannulated and angiography was performed in multiple views. Due to tortuosity of the brachial artery, 0.035" J-tipped glide wire used to access ascending aorta. The catheter was pulled back Out of the body over exchange length J-wire.  Same Catheter was used to perform LV gram hemodynamics. Catheter exchanged out of the body over J-Wire. NO immediate complications noted. Patient tolerated the procedure well. 35 cc contrast used.  Rec: Medical therapy with aggressive risk factor reduction.   Disposition: Will be discharged home today with outpatient follow up.

## 2014-06-22 NOTE — Discharge Instructions (Signed)
Radial Site Care °Refer to this sheet in the next few weeks. These instructions provide you with information on caring for yourself after your procedure. Your caregiver may also give you more specific instructions. Your treatment has been planned according to current medical practices, but problems sometimes occur. Call your caregiver if you have any problems or questions after your procedure. °HOME CARE INSTRUCTIONS °· You may shower the day after the procedure. Remove the bandage (dressing) and gently wash the site with plain soap and water. Gently pat the site dry. °· Do not apply powder or lotion to the site. °· Do not submerge the affected site in water for 3 to 5 days. °· Inspect the site at least twice daily. °· Do not flex or bend the affected arm for 24 hours. °· No lifting over 5 pounds (2.3 kg) for 5 days after your procedure. °· Do not drive home if you are discharged the same day of the procedure. Have someone else drive you. °· You may drive 24 hours after the procedure unless otherwise instructed by your caregiver. °· Do not operate machinery or power tools for 24 hours. °· A responsible adult should be with you for the first 24 hours after you arrive home. °What to expect: °· Any bruising will usually fade within 1 to 2 weeks. °· Blood that collects in the tissue (hematoma) may be painful to the touch. It should usually decrease in size and tenderness within 1 to 2 weeks. °SEEK IMMEDIATE MEDICAL CARE IF: °· You have unusual pain at the radial site. °· You have redness, warmth, swelling, or pain at the radial site. °· You have drainage (other than a small amount of blood on the dressing). °· You have chills. °· You have a fever or persistent symptoms for more than 72 hours. °· You have a fever and your symptoms suddenly get worse. °· Your arm becomes pale, cool, tingly, or numb. °· You have heavy bleeding from the site. Hold pressure on the site and call 911. °Document Released: 03/25/2010 Document  Revised: 05/15/2011 Document Reviewed: 03/25/2010 °[employer]® Patient Information ©2015 [employer]. This information is not intended to replace advice given to you by your health care provider. Make sure you discuss any questions you have with your health care provider. ° °

## 2014-11-20 DIAGNOSIS — I420 Dilated cardiomyopathy: Secondary | ICD-10-CM | POA: Diagnosis not present

## 2014-11-20 DIAGNOSIS — I10 Essential (primary) hypertension: Secondary | ICD-10-CM | POA: Diagnosis not present

## 2014-11-20 DIAGNOSIS — Z23 Encounter for immunization: Secondary | ICD-10-CM | POA: Diagnosis not present

## 2014-11-20 DIAGNOSIS — E119 Type 2 diabetes mellitus without complications: Secondary | ICD-10-CM | POA: Diagnosis not present

## 2014-11-24 DIAGNOSIS — E119 Type 2 diabetes mellitus without complications: Secondary | ICD-10-CM | POA: Diagnosis not present

## 2020-04-30 ENCOUNTER — Encounter (HOSPITAL_COMMUNITY): Payer: Self-pay

## 2020-04-30 ENCOUNTER — Inpatient Hospital Stay (HOSPITAL_COMMUNITY)
Admission: EM | Admit: 2020-04-30 | Discharge: 2020-05-04 | DRG: 564 | Disposition: A | Discharge status: Skilled Nursing Facility | Payer: Medicare PPO | Attending: Internal Medicine | Admitting: Internal Medicine

## 2020-04-30 ENCOUNTER — Emergency Department (HOSPITAL_COMMUNITY): Payer: Medicare PPO

## 2020-04-30 DIAGNOSIS — H409 Unspecified glaucoma: Secondary | ICD-10-CM | POA: Diagnosis present

## 2020-04-30 DIAGNOSIS — R531 Weakness: Secondary | ICD-10-CM

## 2020-04-30 DIAGNOSIS — F0390 Unspecified dementia without behavioral disturbance: Secondary | ICD-10-CM | POA: Diagnosis present

## 2020-04-30 DIAGNOSIS — I10 Essential (primary) hypertension: Secondary | ICD-10-CM

## 2020-04-30 DIAGNOSIS — Z20822 Contact with and (suspected) exposure to covid-19: Secondary | ICD-10-CM | POA: Diagnosis present

## 2020-04-30 DIAGNOSIS — Z7984 Long term (current) use of oral hypoglycemic drugs: Secondary | ICD-10-CM

## 2020-04-30 DIAGNOSIS — G9341 Metabolic encephalopathy: Secondary | ICD-10-CM | POA: Diagnosis present

## 2020-04-30 DIAGNOSIS — M6282 Rhabdomyolysis: Secondary | ICD-10-CM | POA: Diagnosis present

## 2020-04-30 DIAGNOSIS — Z79899 Other long term (current) drug therapy: Secondary | ICD-10-CM

## 2020-04-30 DIAGNOSIS — R269 Unspecified abnormalities of gait and mobility: Secondary | ICD-10-CM | POA: Diagnosis present

## 2020-04-30 DIAGNOSIS — Y92009 Unspecified place in unspecified non-institutional (private) residence as the place of occurrence of the external cause: Secondary | ICD-10-CM

## 2020-04-30 DIAGNOSIS — E785 Hyperlipidemia, unspecified: Secondary | ICD-10-CM | POA: Diagnosis present

## 2020-04-30 DIAGNOSIS — N1831 Chronic kidney disease, stage 3a: Secondary | ICD-10-CM | POA: Diagnosis present

## 2020-04-30 DIAGNOSIS — E1122 Type 2 diabetes mellitus with diabetic chronic kidney disease: Secondary | ICD-10-CM | POA: Diagnosis present

## 2020-04-30 DIAGNOSIS — T796XXA Traumatic ischemia of muscle, initial encounter: Secondary | ICD-10-CM | POA: Diagnosis not present

## 2020-04-30 DIAGNOSIS — Z888 Allergy status to other drugs, medicaments and biological substances status: Secondary | ICD-10-CM

## 2020-04-30 DIAGNOSIS — T6591XA Toxic effect of unspecified substance, accidental (unintentional), initial encounter: Secondary | ICD-10-CM

## 2020-04-30 DIAGNOSIS — W1830XA Fall on same level, unspecified, initial encounter: Secondary | ICD-10-CM | POA: Diagnosis present

## 2020-04-30 DIAGNOSIS — Z23 Encounter for immunization: Secondary | ICD-10-CM

## 2020-04-30 DIAGNOSIS — I129 Hypertensive chronic kidney disease with stage 1 through stage 4 chronic kidney disease, or unspecified chronic kidney disease: Secondary | ICD-10-CM | POA: Diagnosis present

## 2020-04-30 DIAGNOSIS — E119 Type 2 diabetes mellitus without complications: Secondary | ICD-10-CM

## 2020-04-30 DIAGNOSIS — I2510 Atherosclerotic heart disease of native coronary artery without angina pectoris: Secondary | ICD-10-CM | POA: Diagnosis present

## 2020-04-30 DIAGNOSIS — E1169 Type 2 diabetes mellitus with other specified complication: Secondary | ICD-10-CM

## 2020-04-30 DIAGNOSIS — W19XXXA Unspecified fall, initial encounter: Secondary | ICD-10-CM

## 2020-04-30 DIAGNOSIS — I428 Other cardiomyopathies: Secondary | ICD-10-CM | POA: Diagnosis present

## 2020-04-30 DIAGNOSIS — Z66 Do not resuscitate: Secondary | ICD-10-CM | POA: Diagnosis present

## 2020-04-30 DIAGNOSIS — H547 Unspecified visual loss: Secondary | ICD-10-CM | POA: Diagnosis present

## 2020-04-30 DIAGNOSIS — R296 Repeated falls: Secondary | ICD-10-CM

## 2020-04-30 LAB — LIPASE, BLOOD: Lipase: 29 U/L (ref 11–51)

## 2020-04-30 LAB — CBG MONITORING, ED: Glucose-Capillary: 129 mg/dL — ABNORMAL HIGH (ref 70–99)

## 2020-04-30 LAB — COMPREHENSIVE METABOLIC PANEL
ALT: 19 U/L (ref 0–44)
AST: 46 U/L — ABNORMAL HIGH (ref 15–41)
Albumin: 4.4 g/dL (ref 3.5–5.0)
Alkaline Phosphatase: 73 U/L (ref 38–126)
Anion gap: 13 (ref 5–15)
BUN: 17 mg/dL (ref 8–23)
CO2: 23 mmol/L (ref 22–32)
Calcium: 9.6 mg/dL (ref 8.9–10.3)
Chloride: 102 mmol/L (ref 98–111)
Creatinine, Ser: 1.21 mg/dL (ref 0.61–1.24)
GFR, Estimated: 57 mL/min — ABNORMAL LOW (ref >60)
Glucose, Bld: 121 mg/dL — ABNORMAL HIGH (ref 70–99)
Potassium: 4.1 mmol/L (ref 3.5–5.1)
Sodium: 138 mmol/L (ref 135–145)
Total Bilirubin: 1.1 mg/dL (ref 0.3–1.2)
Total Protein: 7.8 g/dL (ref 6.5–8.1)

## 2020-04-30 LAB — CBC WITH DIFFERENTIAL/PLATELET
Abs Immature Granulocytes: 0.02 10*3/uL (ref 0.00–0.07)
Basophils Absolute: 0.1 10*3/uL (ref 0.0–0.1)
Basophils Relative: 1 %
Eosinophils Absolute: 0.2 10*3/uL (ref 0.0–0.5)
Eosinophils Relative: 2 %
HCT: 40 % (ref 39.0–52.0)
Hemoglobin: 12.3 g/dL — ABNORMAL LOW (ref 13.0–17.0)
Immature Granulocytes: 0 %
Lymphocytes Relative: 31 %
Lymphs Abs: 2.5 10*3/uL (ref 0.7–4.0)
MCH: 24.3 pg — ABNORMAL LOW (ref 26.0–34.0)
MCHC: 30.8 g/dL (ref 30.0–36.0)
MCV: 79.1 fL — ABNORMAL LOW (ref 80.0–100.0)
Monocytes Absolute: 0.8 10*3/uL (ref 0.1–1.0)
Monocytes Relative: 9 %
Neutro Abs: 4.7 10*3/uL (ref 1.7–7.7)
Neutrophils Relative %: 57 %
Platelets: 217 10*3/uL (ref 150–400)
RBC: 5.06 MIL/uL (ref 4.22–5.81)
RDW: 14.4 % (ref 11.5–15.5)
WBC: 8.1 10*3/uL (ref 4.0–10.5)
nRBC: 0 % (ref 0.0–0.2)

## 2020-04-30 LAB — CK: Total CK: 1664 U/L — ABNORMAL HIGH (ref 49–397)

## 2020-04-30 MED ORDER — SODIUM CHLORIDE 0.9 % IV BOLUS
1000.0000 mL | Freq: Once | INTRAVENOUS | Status: AC
  Administered 2020-04-30: 1000 mL via INTRAVENOUS

## 2020-04-30 MED ORDER — ONDANSETRON HCL 4 MG/2ML IJ SOLN
4.0000 mg | Freq: Once | INTRAMUSCULAR | Status: AC
  Administered 2020-04-30: 4 mg via INTRAVENOUS
  Filled 2020-04-30: qty 2

## 2020-04-30 NOTE — ED Triage Notes (Signed)
Pt came via EMS from home. Pt allegedly drank glass cleaner last night. Wife found the bottle half empty. According to EMS pt's wife states he fell last night and has been on the floor for the duration of the day. The pt has had episodes of N/V.

## 2020-04-30 NOTE — ED Notes (Signed)
Patient transported to CT 

## 2020-04-30 NOTE — ED Provider Notes (Signed)
[facility]-EMERGENCY DEPT Provider Note   CSN: [id number] Arrival date & time: 04/30/20  1919     History Chief Complaint  Patient presents with  . Ingestion    Grant Jensen. is a 87 y.o. male.  87 yo M with a chief complaint of fall.  Wife states that he fell and was unable to get off the ground for a few hours so she called EMS.  She thinks that maybe he had ingested glass cleaner yesterday.  They had called poison control and they were told to watch for signs of aspiration and that he could have some nausea and vomiting.  The patient is somewhat demented.  He denies any acute issue.  Denies pain anywhere.  Level 5 caveat dementia.  The history is provided by the patient.  Ingestion This is a new problem. The current episode started yesterday. Pertinent negatives include no chest pain, no abdominal pain, no headaches and no shortness of breath.       Past Medical History:  Diagnosis Date  . Diabetes mellitus without complication (HCC)   . Glaucoma     Patient Active Problem List   Diagnosis Date Noted  . Chest pain with high risk for cardiac etiology 06/20/2014    Past Surgical History:  Procedure Laterality Date  . LEFT HEART CATHETERIZATION WITH CORONARY ANGIOGRAM N/A 06/22/2014   Procedure: LEFT HEART CATHETERIZATION WITH CORONARY ANGIOGRAM;  Surgeon: Yates Decamp, MD;  Location: [facility] CATH LAB;  Service: Cardiovascular;  Laterality: N/A;       No family history on file.  Social History   Tobacco Use  . Smoking status: Never Smoker  Substance Use Topics  . Alcohol use: Yes    Home Medications Prior to Admission medications   Medication Sig Start Date End Date Taking? Authorizing Provider  aspirin EC 81 MG tablet Take 81 mg by mouth daily.    [provider]  isosorbide mononitrate (IMDUR) 30 MG 24 hr tablet Take 30 mg by mouth daily.    [provider]  metFORMIN (GLUCOPHAGE) 500 MG tablet Take 1 tablet (500 mg  total) by mouth daily with breakfast. 06/23/14   Yates Decamp, MD  metoprolol succinate (TOPROL-XL) 25 MG 24 hr tablet Take 1 tablet by mouth daily. 06/16/14   [provider]  NITROSTAT 0.4 MG SL tablet Place 1 tablet under the tongue every 5 (five) minutes as needed for chest pain.  05/12/14   [provider]  omeprazole (PRILOSEC) 20 MG capsule Take 20 mg by mouth daily.    [provider]    Allergies    Lisinopril  Review of Systems   Review of Systems  Constitutional: Negative for chills and fever.  HENT: Negative for congestion and facial swelling.   Eyes: Negative for discharge and visual disturbance.  Respiratory: Negative for shortness of breath.   Cardiovascular: Negative for chest pain and palpitations.  Gastrointestinal: Negative for abdominal pain, diarrhea and vomiting.  Musculoskeletal: Negative for arthralgias and myalgias.  Skin: Negative for color change and rash.  Neurological: Negative for tremors, syncope and headaches.  Psychiatric/Behavioral: Negative for confusion and dysphoric mood.    Physical Exam Updated Vital Signs BP (!) 140/101   Pulse 84   Temp 98 F (36.7 C)   Resp 18   Ht 5\' 7"  (1.702 m)   Wt 65.8 kg   SpO2 99%   BMI 22.71 kg/m   Physical Exam Vitals and nursing note reviewed.  Constitutional:  Appearance: He is well-developed and well-nourished.  HENT:     Head: Normocephalic and atraumatic.  Eyes:     Extraocular Movements: EOM normal.     Pupils: Pupils are equal, round, and reactive to light.  Neck:     Vascular: No JVD.  Cardiovascular:     Rate and Rhythm: Normal rate and regular rhythm.     Heart sounds: No murmur heard. No friction rub. No gallop.   Pulmonary:     Effort: No respiratory distress.     Breath sounds: No wheezing.  Abdominal:     General: There is no distension.     Tenderness: There is no abdominal tenderness. There is no guarding or rebound.  Musculoskeletal:        General:  Normal range of motion.     Cervical back: Normal range of motion and neck supple.  Skin:    Coloration: Skin is not pale.     Findings: No rash.  Neurological:     Mental Status: He is alert and oriented to person, place, and time.  Psychiatric:        Mood and Affect: Mood and affect normal.        Behavior: Behavior normal.     ED Results / Procedures / Treatments   Labs (all labs ordered are listed, but only abnormal results are displayed) Labs Reviewed  CBC WITH DIFFERENTIAL/PLATELET - Abnormal; Notable for the following components:      Result Value   Hemoglobin 12.3 (*)    MCV 79.1 (*)    MCH 24.3 (*)    All other components within normal limits  COMPREHENSIVE METABOLIC PANEL - Abnormal; Notable for the following components:   Glucose, Bld 121 (*)    AST 46 (*)    GFR, Estimated 57 (*)    All other components within normal limits  CK - Abnormal; Notable for the following components:   Total CK 1,664 (*)    All other components within normal limits  CBG MONITORING, ED - Abnormal; Notable for the following components:   Glucose-Capillary 129 (*)    All other components within normal limits  LIPASE, BLOOD    EKG None  Radiology CT Head Wo Contrast  Result Date: 04/30/2020 CLINICAL DATA:  Change in mental status EXAM: CT HEAD WITHOUT CONTRAST TECHNIQUE: Contiguous axial images were obtained from the base of the skull through the vertex without intravenous contrast. COMPARISON:  None. FINDINGS: Brain: No evidence of acute territorial infarction, hemorrhage, hydrocephalus,extra-axial collection or mass lesion/mass effect. There is dilatation the ventricles and sulci consistent with age-related atrophy. Low-attenuation changes in the deep white matter consistent with small vessel ischemia. Vascular: No hyperdense vessel or unexpected calcification. Skull: The skull is intact. No fracture or focal lesion identified. Sinuses/Orbits: The visualized paranasal sinuses and  mastoid air cells are clear. The orbits and globes intact. Other: None IMPRESSION: No acute intracranial abnormality. Findings consistent with age related atrophy and chronic small vessel ischemia Electronically Signed   By: Jonna Clark M.D.   On: 04/30/2020 21:14    Procedures Procedures   Medications Ordered in ED Medications  sodium chloride 0.9 % bolus 1,000 mL (1,000 mLs Intravenous New Bag/Given 04/30/20 2133)  ondansetron (ZOFRAN) injection 4 mg (4 mg Intravenous Given 04/30/20 2132)    ED Course  I have reviewed the triage vital signs and the nursing notes.  Pertinent labs & imaging results that were available during my care of the patient were reviewed by me  and considered in my medical decision making (see chart for details).    MDM Rules/Calculators/A&P                          87 yo M with a chief complaints of a fall.  This occurred earlier this evening.  Unable to get off the ground for a few hours.  Patient denies any complaints.  Does not remember a fall.  Will obtain a CT scan of the head.  Blood work.  Bolus of fluids.  Mild CK elevation.  Attempt to ambulate.  Signed out to Dr. Eudelia Bunch, please see his note for further details of care.   The patients results and plan were reviewed and discussed.   Any x-rays performed were independently reviewed by myself.   Differential diagnosis were considered with the presenting HPI.  Medications  sodium chloride 0.9 % bolus 1,000 mL (1,000 mLs Intravenous New Bag/Given 04/30/20 2133)  ondansetron (ZOFRAN) injection 4 mg (4 mg Intravenous Given 04/30/20 2132)    Vitals:   04/30/20 1932 04/30/20 1943 04/30/20 2259  BP: (!) 149/79  (!) 140/101  Pulse: 100  84  Resp:   18  Temp: 98 F (36.7 C)  98 F (36.7 C)  TempSrc: Oral    SpO2: 98%  99%  Weight:  65.8 kg   Height:  5\' 7"  (1.702 m)     Final diagnoses:  Accidental ingestion of substance, initial encounter  Generalized weakness      Final Clinical  Impression(s) / ED Diagnoses Final diagnoses:  Accidental ingestion of substance, initial encounter  Generalized weakness    Rx / DC Orders ED Discharge Orders    None       , DO 04/30/20 2326

## 2020-05-01 ENCOUNTER — Emergency Department (HOSPITAL_COMMUNITY): Payer: Medicare PPO

## 2020-05-01 ENCOUNTER — Other Ambulatory Visit: Payer: Self-pay

## 2020-05-01 ENCOUNTER — Encounter (HOSPITAL_COMMUNITY): Payer: Self-pay | Admitting: Internal Medicine

## 2020-05-01 DIAGNOSIS — I10 Essential (primary) hypertension: Secondary | ICD-10-CM

## 2020-05-01 DIAGNOSIS — E1169 Type 2 diabetes mellitus with other specified complication: Secondary | ICD-10-CM

## 2020-05-01 DIAGNOSIS — I428 Other cardiomyopathies: Secondary | ICD-10-CM

## 2020-05-01 DIAGNOSIS — M6282 Rhabdomyolysis: Secondary | ICD-10-CM | POA: Diagnosis present

## 2020-05-01 DIAGNOSIS — T796XXA Traumatic ischemia of muscle, initial encounter: Principal | ICD-10-CM

## 2020-05-01 DIAGNOSIS — W19XXXA Unspecified fall, initial encounter: Secondary | ICD-10-CM

## 2020-05-01 DIAGNOSIS — Y92009 Unspecified place in unspecified non-institutional (private) residence as the place of occurrence of the external cause: Secondary | ICD-10-CM

## 2020-05-01 DIAGNOSIS — F0390 Unspecified dementia without behavioral disturbance: Secondary | ICD-10-CM

## 2020-05-01 DIAGNOSIS — E119 Type 2 diabetes mellitus without complications: Secondary | ICD-10-CM

## 2020-05-01 LAB — CK: Total CK: 2220 U/L — ABNORMAL HIGH (ref 49–397)

## 2020-05-01 LAB — HEMOGLOBIN A1C
Hgb A1c MFr Bld: 7 % — ABNORMAL HIGH (ref 4.8–5.6)
Mean Plasma Glucose: 154.2 mg/dL

## 2020-05-01 LAB — GLUCOSE, CAPILLARY
Glucose-Capillary: 76 mg/dL (ref 70–99)
Glucose-Capillary: 112 mg/dL — ABNORMAL HIGH (ref 70–99)
Glucose-Capillary: 105 mg/dL — ABNORMAL HIGH (ref 70–99)

## 2020-05-01 LAB — SARS CORONAVIRUS 2 (TAT 6-24 HRS): SARS Coronavirus 2: NEGATIVE

## 2020-05-01 LAB — TSH: TSH: 2.866 u[IU]/mL (ref 0.350–4.500)

## 2020-05-01 MED ORDER — LABETALOL HCL 5 MG/ML IV SOLN: 10.0000 mg | INTRAVENOUS | Status: DC | PRN

## 2020-05-01 MED ORDER — METOPROLOL TARTRATE 5 MG/5ML IV SOLN
5.0000 mg | Freq: Four times a day (QID) | INTRAVENOUS | Status: DC | PRN
Start: 2020-05-01 — End: 2020-05-01

## 2020-05-01 MED ORDER — QUETIAPINE FUMARATE 25 MG PO TABS
25.0000 mg | ORAL_TABLET | Freq: Once | ORAL | Status: AC
  Administered 2020-05-01: 25 mg via ORAL
  Filled 2020-05-01: qty 1

## 2020-05-01 MED ORDER — SODIUM CHLORIDE 0.9 % IV BOLUS
1000.0000 mL | Freq: Once | INTRAVENOUS | Status: AC
  Administered 2020-05-01: 1000 mL via INTRAVENOUS

## 2020-05-01 MED ORDER — ACETAMINOPHEN 325 MG PO TABS: 650.0000 mg | ORAL_TABLET | Freq: Four times a day (QID) | ORAL | Status: DC | PRN

## 2020-05-01 MED ORDER — ACETAMINOPHEN 650 MG RE SUPP: 650.0000 mg | Freq: Four times a day (QID) | RECTAL | Status: DC | PRN

## 2020-05-01 MED ORDER — LACTATED RINGERS IV SOLN: INTRAVENOUS | Status: AC

## 2020-05-01 MED ORDER — INSULIN ASPART 100 UNIT/ML ~~LOC~~ SOLN
0.0000 [IU] | Freq: Three times a day (TID) | SUBCUTANEOUS | Status: DC
  Administered 2020-05-02 – 2020-05-04 (×3): 1 [IU] via SUBCUTANEOUS
  Filled 2020-05-01: qty 0.09

## 2020-05-01 MED ORDER — HEPARIN SODIUM (PORCINE) 5000 UNIT/ML IJ SOLN
5000.0000 [IU] | Freq: Three times a day (TID) | INTRAMUSCULAR | Status: DC
  Administered 2020-05-01: 5000 [IU] via SUBCUTANEOUS
  Filled 2020-05-01 (×2): qty 1

## 2020-05-01 MED ORDER — ISOSORBIDE MONONITRATE ER 30 MG PO TB24
30.0000 mg | ORAL_TABLET | Freq: Every day | ORAL | Status: DC
  Administered 2020-05-01 – 2020-05-04 (×4): 30 mg via ORAL
  Filled 2020-05-01 (×5): qty 1

## 2020-05-01 MED ORDER — SODIUM CHLORIDE 0.9% FLUSH
3.0000 mL | Freq: Two times a day (BID) | INTRAVENOUS | Status: DC
  Administered 2020-05-02: 3 mL via INTRAVENOUS

## 2020-05-01 MED ORDER — ASPIRIN EC 81 MG PO TBEC
81.0000 mg | DELAYED_RELEASE_TABLET | Freq: Every day | ORAL | Status: DC
  Administered 2020-05-01 – 2020-05-04 (×4): 81 mg via ORAL
  Filled 2020-05-01 (×4): qty 1

## 2020-05-01 NOTE — ED Notes (Signed)
Attempted to call report to [place], Charity fundraiser. RN is at lunch right now and will call this RN upon finishing

## 2020-05-01 NOTE — Progress Notes (Addendum)
CSW completed FL2 and faxed patient's clinicals out for review.  Patient needs a PT consult, Dr. Dalene Seltzer placed request.  Edwin Dada, MSW, LCSW Transitions of Care  Clinical Social Worker II [phone]

## 2020-05-01 NOTE — Progress Notes (Signed)
Patient continues to pull out IV's, remove telemetry, and take off hand mittens.  MD notified and seroquel ordered.  Patient pleasantly demented and confused.  Alert to person only.

## 2020-05-01 NOTE — [place] FL2 (Signed)
  [place] MEDICAID FL2 LEVEL OF CARE SCREENING TOOL     IDENTIFICATION  Patient Name: Grant Jensen. Birthdate: December 20, 1928 Sex: male Admission Date (Current Location): 04/30/2020  [facility] and [place] Number:  Producer, television/film/video and Address:  [facility],  [address], [place] [address]      Provider Number: [id number]  Attending Physician Name and Address:  Alvira Monday, MD  Relative Name and Phone Number:       Current Level of Care: Hospital Recommended Level of Care: Skilled Nursing Facility Prior Approval Number:    Date Approved/Denied:   PASRR Number: [id number]  Discharge Plan: SNF    Current Diagnoses: Patient Active Problem List   Diagnosis Date Noted  . Chest pain with high risk for cardiac etiology 06/20/2014    Orientation RESPIRATION BLADDER Height & Weight     Self,Time,Situation  Normal Incontinent Weight: 145 lb (65.8 kg) Height:  5\' 7"  (170.2 cm)  BEHAVIORAL SYMPTOMS/MOOD NEUROLOGICAL BOWEL NUTRITION STATUS      Incontinent Diet (Normal)  AMBULATORY STATUS COMMUNICATION OF NEEDS Skin   Extensive Assist Verbally Normal                       Personal Care Assistance Level of Assistance  Bathing,Dressing,Feeding Bathing Assistance: Maximum assistance Feeding assistance: Limited assistance Dressing Assistance: Maximum assistance     Functional Limitations Info  Sight,Speech,Hearing Sight Info: Impaired Hearing Info: Adequate Speech Info: Adequate    SPECIAL CARE FACTORS FREQUENCY  OT (By licensed OT),PT (By licensed PT)     PT Frequency: 5x weekly OT Frequency: 5x weekly            Contractures Contractures Info: Not present    Additional Factors Info  Code Status,Allergies Code Status Info: Full Allergies Info: Lisinopril           Current Medications (05/01/2020):  This is the current hospital active medication list No current facility-administered medications for this encounter.    Current Outpatient Medications  Medication Sig Dispense Refill  . ibuprofen (ADVIL) 200 MG tablet Take 200 mg by mouth every 6 (six) hours as needed.    05/03/2020 omeprazole (PRILOSEC) 20 MG capsule Take 20 mg by mouth daily.    Marland Kitchen aspirin EC 81 MG tablet Take 81 mg by mouth daily.    . isosorbide mononitrate (IMDUR) 30 MG 24 hr tablet Take 30 mg by mouth daily.    . metFORMIN (GLUCOPHAGE) 500 MG tablet Take 1 tablet (500 mg total) by mouth daily with breakfast.       Discharge Medications: Please see discharge summary for a list of discharge medications.  Relevant Imaging Results:  Relevant Lab Results:   Additional Information SSN: Marland Kitchen  [id number], LCSW

## 2020-05-01 NOTE — ED Notes (Signed)
Wife was bedside, has an appointment to go to at 1100. Wife states that she will be back. Wife is able to be contacted via phone should communication needs arise

## 2020-05-01 NOTE — Evaluation (Signed)
Physical Therapy Evaluation Patient Details Name: Grant Jensen. MRN: [id number] DOB: 1928/06/12 Today's Date: 05/01/2020   History of Present Illness  "87 year old male with a past medical history of diabetes, hypertension, hyperlipidemia, angina, NICM, dementia, blindness reported per wife who presented to the ED with a chief complaint of fall.  Wife states that she believes the patient ingested Windex window cleaner on Thursday evening around 5 PM has she found a half empty bottle of solution near him.  States that he does frequently try to ingest things due to his dementia and is not suicidal and does not have depression.  She states that soon after he vomited around the house.  They called poison control on were told to watch for signs of aspiration.  She states that Thursday evening he was on the floor but she had to leave and when she returned home on Friday she found him still on the floor and with generalized weakness and she was unable to get him off the floor prompting her to call EMS.  She does not believe that he syncopized but is unsure.  In the ED, the patient denied any complaints to the ED provider but had an antalgic gait with ambulation, prompting imaging which was unremarkable."  Clinical Impression  Pt admitted with above diagnosis.  Pt currently with functional limitations due to the deficits listed below (see PT Problem List). Pt will benefit from skilled PT to increase their independence and safety with mobility to allow discharge to the venue listed below.  Pt assisted with ambulating in room and requiring at least min assist for stability and manuevering RW.  Pt poor historian and unable to recall using assistive device at baseline.  Pt would benefit from d/c to SNF.     Follow Up Recommendations SNF    Equipment Recommendations  None recommended by PT    Recommendations for Other Services       Precautions / Restrictions Precautions Precautions: Fall       Mobility  Bed Mobility Overal bed mobility: Needs Assistance Bed Mobility: Supine to Sit;Sit to Supine     Supine to sit: Min assist Sit to supine: Mod assist   General bed mobility comments: assist for trunk upright, assist for LEs onto bed due to fatigue    Transfers Overall transfer level: Needs assistance Equipment used: Rolling walker (2 wheeled) Transfers: Sit to/from Stand Sit to Stand: Min assist         General transfer comment: assist to stabilize with rise  Ambulation/Gait Ambulation/Gait assistance: Min assist Gait Distance (Feet): 30 Feet Assistive device: Rolling walker (2 wheeled) Gait Pattern/deviations: Step-through pattern;Decreased stride length Gait velocity: decr   General Gait Details: verbal cues for use of RW, assist for stability and manuevering RW due to impaired vision, pt reports fatigue and weakness, SPO2 97% on room air after ambulating  Stairs            Wheelchair Mobility    Modified Rankin (Stroke Patients Only)       Balance Overall balance assessment: Needs assistance;History of Falls         Standing balance support: Bilateral upper extremity supported Standing balance-Leahy Scale: Poor Standing balance comment: reliant on UE support                             Pertinent Vitals/Pain Pain Assessment: No/denies pain    Home Living Family/patient expects to be discharged to:: Private  residence Living Arrangements: Spouse/significant other               Additional Comments: pt poor historian, from chart pt is from home with spouse    Prior Function           Comments: ambulatory, ?assistive device     Hand Dominance        Extremity/Trunk Assessment        Lower Extremity Assessment Lower Extremity Assessment: Generalized weakness       Communication   Communication: HOH (extremely HOH, vision impairments)  Cognition Arousal/Alertness: Awake/alert Behavior During Therapy:  WFL for tasks assessed/performed Overall Cognitive Status: No family/caregiver present to determine baseline cognitive functioning                                 General Comments: pt pleasant and following commands, HOH and impaired vision possibly limiting      General Comments      Exercises     Assessment/Plan    PT Assessment Patient needs continued PT services  PT Problem List Decreased balance;Decreased activity tolerance;Decreased strength;Decreased mobility;Decreased knowledge of use of DME;Decreased safety awareness       PT Treatment Interventions DME instruction;Gait training;Therapeutic exercise;Balance training;Functional mobility training;Therapeutic activities;Patient/family education    PT Goals (Current goals can be found in the Care Plan section)  Acute Rehab PT Goals Patient Stated Goal: agreeable for mobility PT Goal Formulation: With patient Time For Goal Achievement: 05/15/20 Potential to Achieve Goals: Good    Frequency Min 2X/week   Barriers to discharge        Co-evaluation               AM-PAC PT "6 Clicks" Mobility  Outcome Measure Help needed turning from your back to your side while in a flat bed without using bedrails?: A Little Help needed moving from lying on your back to sitting on the side of a flat bed without using bedrails?: A Little Help needed moving to and from a bed to a chair (including a wheelchair)?: A Little Help needed standing up from a chair using your arms (e.g., wheelchair or bedside chair)?: A Little Help needed to walk in hospital room?: A Little Help needed climbing 3-5 steps with a railing? : A Lot 6 Click Score: 17    End of Session Equipment Utilized During Treatment: Gait belt Activity Tolerance: Patient tolerated treatment well Patient left: with call bell/phone within reach;in bed;with bed alarm set   PT Visit Diagnosis: Difficulty in walking, not elsewhere classified (R26.2);History of  falling (Z91.81)    Time: 5364-6803 PT Time Calculation (min) (ACUTE ONLY): 17 min   Charges:   PT Evaluation $PT Eval Low Complexity: 1 Low        Kati PT, DPT Acute Rehabilitation Services Pager: [phone] Office: [phone]  Sarajane Jews 05/01/2020, 3:29 PM

## 2020-05-01 NOTE — Progress Notes (Signed)
PT Cancellation Note  Patient Details Name: Grant Jensen. MRN: [id number] DOB: 1928/09/17   Cancelled Treatment:     Pt awaiting transfer to the floor.  Will check back as schedule permits.   LEMYRE,KATHrine E 05/01/2020, 1:43 PM Thomasene Mohair PT, DPT Acute Rehabilitation Services Pager: [phone] Office: [phone]

## 2020-05-01 NOTE — ED Provider Notes (Signed)
  Physical Exam  BP 127/82   Pulse 77   Temp 98.9 F (37.2 C) (Oral)   Resp 16   Ht 5\' 7"  (1.702 m)   Wt 65.8 kg   SpO2 99%   BMI 22.71 kg/m   Physical Exam  ED Course/Procedures     Procedures  MDM    Please see previous notes for history, physical and care.  Briefly this is an 87 year old male who presented after he had been found down from a fall.  He appeared to have some pain while he was ambulating.  Obtained x-rays of lumbar spine and pelvis which do not show signs of fracture, and he does not show any focal tenderness to clinically suggest this.  Family reports they do not feel comfortable with him going home, and that his wife is having a difficult time taking care of him.  Patient had been laying on the floor yesterday, had initial CK that was mildly elevated.  He was given 1 L of normal saline.  On recheck of CK this morning, it has increased to about 2000.  Given it is continuing to increase despite the fluid he received last night, will admit for continued hydration and observation.     92, MD 05/02/20 [phone]

## 2020-05-01 NOTE — H&P (Addendum)
History and Physical        Hospital Admission Note Date: 05/01/2020  Patient name: Grant Jensen. Medical record number: [id number] Date of birth: 1929-01-01 Age: 87 y.o. Gender: male  PCP: [name], MD  Patient coming from: home Lives with: wife At baseline, ambulates: independently  Chief Complaint    Chief Complaint  Patient presents with  . Ingestion      HPI:   Patient is a poor historian given his dementia and history is obtained from wife over the phone  This is an 87 year old male with a past medical history of diabetes, hypertension, hyperlipidemia, angina, NICM, dementia, blindness reported per wife who presented to the ED with a chief complaint of fall.  Wife states that she believes the patient ingested Windex window cleaner on Thursday evening around 5 PM has she found a half empty bottle of solution near him.  States that he does frequently try to ingest things due to his dementia and is not suicidal and does not have depression.  She states that soon after he vomited around the house.  They called poison control on were told to watch for signs of aspiration.  She states that Thursday evening he was on the floor but she had to leave and when she returned home on Friday she found him still on the floor and with generalized weakness and she was unable to get him off the floor prompting her to call EMS.  She does not believe that he syncopized but is unsure.  In the ED, the patient denied any complaints to the ED provider but had an antalgic gait with ambulation, prompting imaging which was unremarkable.    ED Course: Afebrile, hypertensive, on room air. Notable Labs: Sodium 138, K4.1, glucose 121, BUN 17, creatinine 1.21, CK 1664 -> 2220 despite fluids, WBC 8.1, Hb 12.3, COVID-19 pending. Notable Imaging: CT head-unremarkable for acute findings.  Pelvis and lumbar  spine XR-unremarkable. Patient received Zofran, 2 L NS bolus. He was evaluated by PT and social work and initial plan was for observation in the ED until discharge to SNF however his CK was noted to be increasing despite IV fluids, prompting request for hospitalist admission.   Vitals:   05/01/20 1100 05/01/20 1206  BP: (!) 149/77 (!) 152/111  Pulse: 77 81  Resp: 16 16  Temp:    SpO2: 100% 100%     Review of Systems:  Review of Systems  All other systems reviewed and are negative.   Medical/Social/Family History   Past Medical History: Past Medical History:  Diagnosis Date  . Diabetes mellitus without complication (HCC)   . Glaucoma     Past Surgical History:  Procedure Laterality Date  . LEFT HEART CATHETERIZATION WITH CORONARY ANGIOGRAM N/A 06/22/2014   Procedure: LEFT HEART CATHETERIZATION WITH CORONARY ANGIOGRAM;  Surgeon: Yates Decamp, MD;  Location: [facility] CATH LAB;  Service: Cardiovascular;  Laterality: N/A;    Medications: Prior to Admission medications   Medication Sig Start Date End Date Taking? Authorizing Provider  ibuprofen (ADVIL) 200 MG tablet Take 200 mg by mouth every 6 (six) hours as needed.   Yes [provider]  omeprazole (PRILOSEC) 20 MG capsule Take 20 mg  by mouth daily.   Yes [provider]  aspirin EC 81 MG tablet Take 81 mg by mouth daily.    [provider]  isosorbide mononitrate (IMDUR) 30 MG 24 hr tablet Take 30 mg by mouth daily.    [provider]  metFORMIN (GLUCOPHAGE) 500 MG tablet Take 1 tablet (500 mg total) by mouth daily with breakfast. 06/23/14   Yates Decamp, MD    Allergies:   Allergies  Allergen Reactions  . Lisinopril Cough    Social History:  reports that he has never smoked. He does not have any smokeless tobacco history on file. He reports current alcohol use. No history on file for drug use.  Family History: No family history on file.   Objective   Physical Exam: Blood pressure (!)  152/111, pulse 81, temperature 98.9 F (37.2 C), temperature source Oral, resp. rate 16, height 5\' 7"  (1.702 m), weight 65.8 kg, SpO2 100 %.  Physical Exam Vitals and nursing note reviewed.  Constitutional:      Appearance: Normal appearance.  HENT:     Head: Normocephalic and atraumatic.  Eyes:     Conjunctiva/sclera: Conjunctivae normal.  Cardiovascular:     Rate and Rhythm: Normal rate and regular rhythm.  Pulmonary:     Effort: Pulmonary effort is normal.     Breath sounds: Normal breath sounds.  Abdominal:     General: Abdomen is flat.     Palpations: Abdomen is soft.  Musculoskeletal:        General: No swelling or tenderness.  Skin:    Coloration: Skin is not jaundiced or pale.  Neurological:     Mental Status: He is alert.     Comments: At baseline dementia  Psychiatric:        Mood and Affect: Mood normal.        Behavior: Behavior normal.     LABS on Admission: I have personally reviewed all the labs and imaging below    Basic Metabolic Panel: Recent Labs  Lab 04/30/20 2130  NA 138  K 4.1  CL 102  CO2 23  GLUCOSE 121*  BUN 17  CREATININE 1.21  CALCIUM 9.6   Liver Function Tests: Recent Labs  Lab 04/30/20 2130  AST 46*  ALT 19  ALKPHOS 73  BILITOT 1.1  PROT 7.8  ALBUMIN 4.4   Recent Labs  Lab 04/30/20 2130  LIPASE 29   No results for input(s): AMMONIA in the last 168 hours. CBC: Recent Labs  Lab 04/30/20 2130  WBC 8.1  NEUTROABS 4.7  HGB 12.3*  HCT 40.0  MCV 79.1*  PLT 217   Cardiac Enzymes: Recent Labs  Lab 04/30/20 2130 05/01/20 1047  CKTOTAL 1,664* 2,220*   BNP: Invalid input(s): POCBNP CBG: Recent Labs  Lab 04/30/20 2038  GLUCAP 129*    Radiological Exams on Admission:  DG Lumbar Spine 2-3 Views  Result Date: 05/01/2020 CLINICAL DATA:  Fall today with back pain EXAM: LUMBAR SPINE - 2-3 VIEW COMPARISON:  MRI lumbar spine December 07, 2004 FINDINGS: There is no evidence of lumbar spine fracture. Alignment is  normal. Multilevel degenerative change of the lumbar spine with disc space narrowing, vacuum disc artifact, facet hypertrophy and osteophytosis. IMPRESSION: No acute osseous abnormality. Multilevel degenerative change of the lumbar spine. Electronically Signed   By: December 09, 2004 MD   On: 05/01/2020 09:09   DG Pelvis 1-2 Views  Result Date: 05/01/2020 CLINICAL DATA:  Un witnessed fall. EXAM: PELVIS - 1-2 VIEW  COMPARISON:  None. FINDINGS: There is no evidence of pelvic fracture or diastasis. No pelvic bone lesions are seen. Pelvic phleboliths. Vascular calcifications. IMPRESSION: No acute osseous abnormality. Electronically Signed   By: Maudry Mayhew MD   On: 05/01/2020 09:08   CT Head Wo Contrast  Result Date: 04/30/2020 CLINICAL DATA:  Change in mental status EXAM: CT HEAD WITHOUT CONTRAST TECHNIQUE: Contiguous axial images were obtained from the base of the skull through the vertex without intravenous contrast. COMPARISON:  None. FINDINGS: Brain: No evidence of acute territorial infarction, hemorrhage, hydrocephalus,extra-axial collection or mass lesion/mass effect. There is dilatation the ventricles and sulci consistent with age-related atrophy. Low-attenuation changes in the deep white matter consistent with small vessel ischemia. Vascular: No hyperdense vessel or unexpected calcification. Skull: The skull is intact. No fracture or focal lesion identified. Sinuses/Orbits: The visualized paranasal sinuses and mastoid air cells are clear. The orbits and globes intact. Other: None IMPRESSION: No acute intracranial abnormality. Findings consistent with age related atrophy and chronic small vessel ischemia Electronically Signed   By: Jonna Clark M.D.   On: 04/30/2020 21:14      EKG: Not done   A & P   Active Problems:   Rhabdomyolysis   Dementia (HCC)   Fall at home, initial encounter   Diabetes [facility])   Essential hypertension   NICM (nonischemic cardiomyopathy) (HCC)   1. Rhabdomyolysis  after fall a. CKD 1664 -> 2220 despite IV fluids b. Renal function at baseline c. Continue Lactated Ringer's and repeat CK in a.m.  2. S/p fall a. Unclear if syncope versus mechanical b. PT eval and SNF at discharge c. Telemetry  3. Ingestion a. Reportedly ingested window cleaner 48 hours ago and has a history of ingesting substances per wife due to his advanced dementia, without evidence of depression - discussed with Psych over the phone who agrees this is not depression b. Other than CK, labs and mentation unremarkable c. Continue to monitor  4. Dementia a. Per wife, Alzheimer's type. No formal diagnosis documented. b. Recommend close outpatient eval  c. may need to be set up for a memory care unit since he is at risk of ingesting substances, as above  5. Diabetes a. Holding home Metformin b. Sensitive sliding scale  6. History of Hypertension  Hyperlipidemia  NICM  Angina a. Continue home Imdur, aspirin   DVT prophylaxis: heparin   Code Status: DNR  Diet: heart healthy/carb modified Family Communication: Admission, patients condition and plan of care including tests being ordered have been discussed with the patient who indicates understanding and agrees with the plan and Code Status. Patient's wife was updated  Disposition Plan: The appropriate patient status for this patient is OBSERVATION. Observation status is judged to be reasonable and necessary in order to provide the required intensity of service to ensure the patient's safety. The patient's presenting symptoms, physical exam findings, and initial radiographic and laboratory data in the context of their medical condition is felt to place them at decreased risk for further clinical deterioration. Furthermore, it is anticipated that the patient will be medically stable for discharge from the hospital within 2 midnights of admission. The following factors support the patient status of observation.   " The patient's  presenting symptoms include fall, ingestion. " The physical exam findings include unremarkable. " The initial radiographic and laboratory data are concerning for mild rhabdo.     Consultants  . none  Procedures  . none  Time Spent on Admission: 60 minutes  Jae Dire, DO [facility]  05/01/2020, 1:05 PM

## 2020-05-01 NOTE — ED Provider Notes (Signed)
I assumed care of this patient.  Please see previous provider note for further details of Hx, PE.  Briefly patient is a 87 y.o. male who presented for fall and possible ingestion. CT head negative. CK mildly elevated. IVF given. Plan to ambulate.  Patient with good strength in all extremities but refusing to walk. Finally able to walk him and noted to be antalgic. Plan for hip plain film. TOC for home health. Nira Conn, MD 05/01/20 [phone]

## 2020-05-01 NOTE — ED Notes (Signed)
Called report to Trent, RN 

## 2020-05-02 DIAGNOSIS — Z66 Do not resuscitate: Secondary | ICD-10-CM | POA: Diagnosis present

## 2020-05-02 DIAGNOSIS — H547 Unspecified visual loss: Secondary | ICD-10-CM | POA: Diagnosis present

## 2020-05-02 DIAGNOSIS — H409 Unspecified glaucoma: Secondary | ICD-10-CM | POA: Diagnosis present

## 2020-05-02 DIAGNOSIS — F0390 Unspecified dementia without behavioral disturbance: Secondary | ICD-10-CM | POA: Diagnosis present

## 2020-05-02 DIAGNOSIS — W19XXXA Unspecified fall, initial encounter: Secondary | ICD-10-CM | POA: Diagnosis not present

## 2020-05-02 DIAGNOSIS — I10 Essential (primary) hypertension: Secondary | ICD-10-CM | POA: Diagnosis not present

## 2020-05-02 DIAGNOSIS — Z20822 Contact with and (suspected) exposure to covid-19: Secondary | ICD-10-CM | POA: Diagnosis present

## 2020-05-02 DIAGNOSIS — I428 Other cardiomyopathies: Secondary | ICD-10-CM | POA: Diagnosis present

## 2020-05-02 DIAGNOSIS — Z23 Encounter for immunization: Secondary | ICD-10-CM | POA: Diagnosis not present

## 2020-05-02 DIAGNOSIS — R269 Unspecified abnormalities of gait and mobility: Secondary | ICD-10-CM | POA: Diagnosis present

## 2020-05-02 DIAGNOSIS — M6282 Rhabdomyolysis: Secondary | ICD-10-CM | POA: Diagnosis present

## 2020-05-02 DIAGNOSIS — Z7984 Long term (current) use of oral hypoglycemic drugs: Secondary | ICD-10-CM | POA: Diagnosis not present

## 2020-05-02 DIAGNOSIS — E119 Type 2 diabetes mellitus without complications: Secondary | ICD-10-CM | POA: Diagnosis not present

## 2020-05-02 DIAGNOSIS — Y92009 Unspecified place in unspecified non-institutional (private) residence as the place of occurrence of the external cause: Secondary | ICD-10-CM | POA: Diagnosis not present

## 2020-05-02 DIAGNOSIS — Z79899 Other long term (current) drug therapy: Secondary | ICD-10-CM | POA: Diagnosis not present

## 2020-05-02 DIAGNOSIS — Z888 Allergy status to other drugs, medicaments and biological substances status: Secondary | ICD-10-CM | POA: Diagnosis not present

## 2020-05-02 DIAGNOSIS — E1122 Type 2 diabetes mellitus with diabetic chronic kidney disease: Secondary | ICD-10-CM | POA: Diagnosis present

## 2020-05-02 DIAGNOSIS — I129 Hypertensive chronic kidney disease with stage 1 through stage 4 chronic kidney disease, or unspecified chronic kidney disease: Secondary | ICD-10-CM | POA: Diagnosis present

## 2020-05-02 DIAGNOSIS — W1830XA Fall on same level, unspecified, initial encounter: Secondary | ICD-10-CM | POA: Diagnosis present

## 2020-05-02 DIAGNOSIS — R531 Weakness: Secondary | ICD-10-CM | POA: Diagnosis present

## 2020-05-02 DIAGNOSIS — G9341 Metabolic encephalopathy: Secondary | ICD-10-CM | POA: Diagnosis present

## 2020-05-02 DIAGNOSIS — I2510 Atherosclerotic heart disease of native coronary artery without angina pectoris: Secondary | ICD-10-CM | POA: Diagnosis present

## 2020-05-02 DIAGNOSIS — T796XXA Traumatic ischemia of muscle, initial encounter: Secondary | ICD-10-CM | POA: Diagnosis present

## 2020-05-02 DIAGNOSIS — N1831 Chronic kidney disease, stage 3a: Secondary | ICD-10-CM | POA: Diagnosis present

## 2020-05-02 DIAGNOSIS — E785 Hyperlipidemia, unspecified: Secondary | ICD-10-CM | POA: Diagnosis present

## 2020-05-02 LAB — BASIC METABOLIC PANEL
Anion gap: 9 (ref 5–15)
BUN: 15 mg/dL (ref 8–23)
CO2: 24 mmol/L (ref 22–32)
Calcium: 8.8 mg/dL — ABNORMAL LOW (ref 8.9–10.3)
Chloride: 107 mmol/L (ref 98–111)
Creatinine, Ser: 1.28 mg/dL — ABNORMAL HIGH (ref 0.61–1.24)
GFR, Estimated: 53 mL/min — ABNORMAL LOW (ref >60)
Glucose, Bld: 95 mg/dL (ref 70–99)
Potassium: 4.1 mmol/L (ref 3.5–5.1)
Sodium: 140 mmol/L (ref 135–145)

## 2020-05-02 LAB — CBC
HCT: 35.7 % — ABNORMAL LOW (ref 39.0–52.0)
Hemoglobin: 10.9 g/dL — ABNORMAL LOW (ref 13.0–17.0)
MCH: 24.7 pg — ABNORMAL LOW (ref 26.0–34.0)
MCHC: 30.5 g/dL (ref 30.0–36.0)
MCV: 80.8 fL (ref 80.0–100.0)
Platelets: 188 10*3/uL (ref 150–400)
RBC: 4.42 MIL/uL (ref 4.22–5.81)
RDW: 14.5 % (ref 11.5–15.5)
WBC: 6.4 10*3/uL (ref 4.0–10.5)
nRBC: 0 % (ref 0.0–0.2)

## 2020-05-02 LAB — GLUCOSE, CAPILLARY
Glucose-Capillary: 85 mg/dL (ref 70–99)
Glucose-Capillary: 122 mg/dL — ABNORMAL HIGH (ref 70–99)
Glucose-Capillary: 91 mg/dL (ref 70–99)
Glucose-Capillary: 96 mg/dL (ref 70–99)

## 2020-05-02 LAB — CK: Total CK: 1767 U/L — ABNORMAL HIGH (ref 49–397)

## 2020-05-02 MED ORDER — SODIUM CHLORIDE 0.45 % IV SOLN: INTRAVENOUS | Status: DC

## 2020-05-02 MED ORDER — ENOXAPARIN SODIUM 40 MG/0.4ML ~~LOC~~ SOLN
40.0000 mg | SUBCUTANEOUS | Status: DC
  Administered 2020-05-02 – 2020-05-03 (×2): 40 mg via SUBCUTANEOUS
  Filled 2020-05-02 (×2): qty 0.4

## 2020-05-02 NOTE — Progress Notes (Signed)
PROGRESS NOTE    Grant Jensen.  AOZ:[id number] DOB: Jun 14, 1928 DOA: 04/30/2020 PCP: [name], MD    Brief Narrative:  Grant Jensen was admitted to the hospital with a working diagnosis of traumatic rhabdomyolysis.  87 year old male past medical history for type 2 diabetes mellitus, hypertension, dyslipidemia, nonischemic cardiomyopathy, blindness and dementia who presented after mechanical fall.  Per his wife his dementia has been progressive and worsening, 48 hours before admission he ingested Windex window cleaner with immediate vomiting.  Poison control was contacted with instructions to watch for signs of aspiration.  The same day during the evening hours apparently patient fell and remained on the floor until the following day when he was found with generalized weakness and unable to stand.  EMS was called and patient was brought to the hospital.  On his initial physical examination blood pressure 152/111, heart rate 81, temperature 98.9, respiratory rate 16, oxygen saturation 100%, his lungs are clear to auscultation, heart S1-S2, present rhythmic, soft abdomen, no lower extremity edema patient was confused and disoriented (at his baseline).   Sodium 138, potassium 4.1, chloride 102, bicarb 23, glucose 121, BUN 17, creatinine six 1.21, anion gap 13, CK 1664-2220, white count 8.1, hemoglobin 12.3, hematocrit 40.0, platelets 217. SARS COVID-19 negative. Head CT no acute changes. Radiograph lumbar pelvic spine negative for acute fracture.  Assessment & Plan:   Principal Problem:   Rhabdomyolysis Active Problems:   Dementia (HCC)   Fall at home, initial encounter   Diabetes [facility])   Essential hypertension   NICM (nonischemic cardiomyopathy) (HCC)   1. Traumatic rhabdomyolysis in the setting of CKD stage 3a. Patient pulled his IV yesterday, now has new access. CK is 1,767, K is 4,1 and bicarbonate is 24, serum cr is 1,28.   Resume IV fluids with 0.45% saline to prevent  hyperchloremia and hypernatremia.  Follow on renal function in am, avoid hypotension and nephrotoxic medications.   2, T2DM. Continue glucose cover and monitoring with insulin sliding scale, patient is tolerating po well.   3. HTN and CAD. Patient is chest pain free, continue with aspirin and isosorbide.   4. Dementia with ambulatory dysfunction. Acute metabolic encephalopathy with episodic delirium. Patient had one dose of quetiapine yesterday with good toleration.   Continue neuro checks per unit protocol, PT and OT evaluation. Consult nutrition.   Patient continue to be at high risk for worsening rhabdomyolysis.   Status is: Observation  The patient will require care spanning > 2 midnights and should be moved to inpatient because: IV treatments appropriate due to intensity of illness or inability to take PO  Dispo: The patient is from: Home              Anticipated d/c is to: Home              Patient currently is not medically stable to d/c.   Difficult to place patient No    DVT prophylaxis: Enoxaparin   Code Status:   DNR   Family Communication:  I spoke with patient's wife at the bedside, we talked in detail about patient's condition, plan of care and prognosis and all questions were addressed.       Subjective: Patient is calm, but confused and disorientated, not agitated. Denies any dyspnea or chest pain, no nausea or vomiting.   Objective: Vitals:   05/01/20 1355 05/01/20 1500 05/01/20 1945 05/02/20 0423  BP: (!) 184/101 (!) 174/92 113/80 103/84  Pulse: 100  88 86  Resp:  18  16 20   Temp: 98.2 F (36.8 C)  98.5 F (36.9 C) 99 F (37.2 C)  TempSrc:   Oral Oral  SpO2: 100%  100% 100%  Weight:      Height:        Intake/Output Summary (Last 24 hours) at 05/02/2020 1122 Last data filed at 05/02/2020 0423 Gross per 24 hour  Intake 300 ml  Output -  Net 300 ml   Filed Weights   04/30/20 1943 05/01/20 1332  Weight: 65.8 kg 69.2 kg    Examination:    General: Not in pain or dyspnea, deconditioned  Neurology: Awake and alert, non focal. Disorientated to place and time but not person. Follows commands.  E ENT: no pallor, no icterus, oral mucosa moist Cardiovascular: No JVD. S1-S2 present, rhythmic, no gallops, rubs, or murmurs. No lower extremity edema. Pulmonary: positive breath sounds bilaterally, adequate air movement, no wheezing, rhonchi or rales. Anterior auscultation.  Gastrointestinal. Abdomen soft and non tender Skin. No rashes Musculoskeletal: no joint deformities     Data Reviewed: I have personally reviewed following labs and imaging studies  CBC: Recent Labs  Lab 04/30/20 2130 05/02/20 0442  WBC 8.1 6.4  NEUTROABS 4.7  --   HGB 12.3* 10.9*  HCT 40.0 35.7*  MCV 79.1* 80.8  PLT 217 188   Basic Metabolic Panel: Recent Labs  Lab 04/30/20 2130 05/02/20 0442  NA 138 140  K 4.1 4.1  CL 102 107  CO2 23 24  GLUCOSE 121* 95  BUN 17 15  CREATININE 1.21 1.28*  CALCIUM 9.6 8.8*   GFR: Estimated Creatinine Clearance: 36.6 mL/min (A) (by C-G formula based on SCr of 1.28 mg/dL (H)). Liver Function Tests: Recent Labs  Lab 04/30/20 2130  AST 46*  ALT 19  ALKPHOS 73  BILITOT 1.1  PROT 7.8  ALBUMIN 4.4   Recent Labs  Lab 04/30/20 2130  LIPASE 29   No results for input(s): AMMONIA in the last 168 hours. Coagulation Profile: No results for input(s): INR, PROTIME in the last 168 hours. Cardiac Enzymes: Recent Labs  Lab 04/30/20 2130 05/01/20 1047 05/02/20 0442  CKTOTAL 1,664* 2,220* 1,767*   BNP (last 3 results) No results for input(s): PROBNP in the last 8760 hours. HbA1C: Recent Labs    05/01/20 1232  HGBA1C 7.0*   CBG: Recent Labs  Lab 04/30/20 2038 05/01/20 1400 05/01/20 1817 05/01/20 1940 05/02/20 0740  GLUCAP 129* 76 112* 105* 85   Lipid Profile: No results for input(s): CHOL, HDL, LDLCALC, TRIG, CHOLHDL, LDLDIRECT in the last 72 hours. Thyroid Function Tests: Recent Labs     05/01/20 1232  TSH 2.866   Anemia Panel: No results for input(s): VITAMINB12, FOLATE, FERRITIN, TIBC, IRON, RETICCTPCT in the last 72 hours.    Radiology Studies: I have reviewed all of the imaging during this hospital visit personally     Scheduled Meds: . aspirin EC  81 mg Oral Daily  . heparin  5,000 Units Subcutaneous Q8H  . insulin aspart  0-9 Units Subcutaneous TID WC  . isosorbide mononitrate  30 mg Oral Daily  . sodium chloride flush  3 mL Intravenous Q12H   Continuous Infusions:   LOS: 0 days        Mauricio 05/03/20, MD

## 2020-05-03 DIAGNOSIS — W19XXXA Unspecified fall, initial encounter: Secondary | ICD-10-CM | POA: Diagnosis not present

## 2020-05-03 DIAGNOSIS — I10 Essential (primary) hypertension: Secondary | ICD-10-CM | POA: Diagnosis not present

## 2020-05-03 DIAGNOSIS — F0390 Unspecified dementia without behavioral disturbance: Secondary | ICD-10-CM | POA: Diagnosis not present

## 2020-05-03 DIAGNOSIS — T796XXA Traumatic ischemia of muscle, initial encounter: Secondary | ICD-10-CM | POA: Diagnosis not present

## 2020-05-03 LAB — GLUCOSE, CAPILLARY
Glucose-Capillary: 71 mg/dL (ref 70–99)
Glucose-Capillary: 83 mg/dL (ref 70–99)
Glucose-Capillary: 134 mg/dL — ABNORMAL HIGH (ref 70–99)
Glucose-Capillary: 111 mg/dL — ABNORMAL HIGH (ref 70–99)
Glucose-Capillary: 110 mg/dL — ABNORMAL HIGH (ref 70–99)

## 2020-05-03 LAB — BASIC METABOLIC PANEL
Anion gap: 9 (ref 5–15)
BUN: 15 mg/dL (ref 8–23)
CO2: 24 mmol/L (ref 22–32)
Calcium: 8.8 mg/dL — ABNORMAL LOW (ref 8.9–10.3)
Chloride: 105 mmol/L (ref 98–111)
Creatinine, Ser: 1.3 mg/dL — ABNORMAL HIGH (ref 0.61–1.24)
GFR, Estimated: 53 mL/min — ABNORMAL LOW (ref >60)
Glucose, Bld: 89 mg/dL (ref 70–99)
Potassium: 4.1 mmol/L (ref 3.5–5.1)
Sodium: 138 mmol/L (ref 135–145)

## 2020-05-03 LAB — CK: Total CK: 887 U/L — ABNORMAL HIGH (ref 49–397)

## 2020-05-03 NOTE — Plan of Care (Signed)
  Problem: Clinical Measurements: Goal: Will remain free from infection Outcome: Progressing Goal: Diagnostic test results will improve Outcome: Progressing Goal: Respiratory complications will improve Outcome: Progressing Goal: Cardiovascular complication will be avoided Outcome: Progressing   Problem: Nutrition: Goal: Adequate nutrition will be maintained Outcome: Progressing   Problem: Elimination: Goal: Will not experience complications related to bowel motility Outcome: Progressing Goal: Will not experience complications related to urinary retention Outcome: Progressing   Problem: Pain Managment: Goal: General experience of comfort will improve Outcome: Progressing   Problem: Safety: Goal: Ability to remain free from injury will improve Outcome: Progressing   Problem: Skin Integrity: Goal: Risk for impaired skin integrity will decrease Outcome: Progressing   

## 2020-05-03 NOTE — TOC Progression Note (Signed)
Transition of Care (TOC) - Progression Note    Patient Details  Name: Grant Jensen. MRN: [id number] Date of Birth: 1928-05-20  Transition of Care [facility]) CM/SW Contact  Geni Bers, RN Phone Number: 05/03/2020, 3:04 PM  Clinical Narrative:     Spoke with pt's wife concerning discharge plans. Wife selected [employer]. Insurance authorization started with [employer] ref # [id number].          Expected Discharge Plan and Services                                                 Social Determinants of Health (SDOH) Interventions    Readmission Risk Interventions No flowsheet data found.

## 2020-05-03 NOTE — Evaluation (Signed)
Occupational Therapy Evaluation Patient Details Name: Grant Jensen. MRN: [id number] DOB: October 11, 1928 Today's Date: 05/03/2020    History of Present Illness "87 year old male with a past medical history of diabetes, hypertension, hyperlipidemia, angina, NICM, dementia, blindness reported per wife who presented to the ED with a chief complaint of fall.  Wife states that she believes the patient ingested Windex window cleaner on Thursday evening around 5 PM has she found a half empty bottle of solution near him.  States that he does frequently try to ingest things due to his dementia and is not suicidal and does not have depression.  She states that soon after he vomited around the house.  They called poison control on were told to watch for signs of aspiration.  She states that Thursday evening he was on the floor but she had to leave and when she returned home on Friday she found him still on the floor and with generalized weakness and she was unable to get him off the floor prompting her to call EMS.  She does not believe that he syncopized but is unsure.  In the ED, the patient denied any complaints to the ED provider but had an antalgic gait with ambulation, prompting imaging which was unremarkable."   Clinical Impression   Patient is currently requiring assistance with ADLs including minimal to moderate assist with toileting, moderate assist with LE dressing, moderate assist with bathing, and minimal assist with UE dressing, grooming and eating, all of which is below patient's typical baseline of being Modified independent with all ADLs except bathing.  During this evaluation, patient was limited by generalized weakness, limited activity tolerance and baseline dementia and blindness, which has the potential to impact patient's safety and independence during functional mobility, as well as performance for ADLs. [employer] AM-PAC "6-clicks" Daily Activity Inpatient Short Form score of 16/24  indicates 53.32% ADL impairment this session. Patient lives with his spouse, who is able to provide near 24/7 supervision and assistance but does need to leave pt alone at home at times for errands and uses cameras throughout the home to keep watch on pt.  Patient demonstrates good rehab potential, and should benefit from continued skilled occupational therapy services while in acute care to maximize safety, independence and quality of life at home.  Continued occupational therapy services in a SNF setting prior to return home is recommended. ?     Follow Up Recommendations  SNF;Other (comment) (Pt's spouse stated that she remains undecidede on SNF vs home. If pt does go home, recommend [facility] OT and 24/7 care.)    Equipment Recommendations       Recommendations for Other Services       Precautions / Restrictions Precautions Precautions: Fall Precaution Comments: Pt is legally blind with dementia Restrictions Weight Bearing Restrictions: No      Mobility Bed Mobility Overal bed mobility:  (Pt up in chair)               Patient Response: Impulsive  Transfers Overall transfer level: Needs assistance   Transfers: Sit to/from Stand Sit to Stand: Min assist         General transfer comment: Pt stood from recliner with Min HHA. Pt ambulated in room with gait belt Min Guard assist ~15' x 2. Pt refused further ambulation stating, "I'm tired."    Balance Overall balance assessment: Needs assistance;History of Falls Sitting-balance support: Feet supported Sitting balance-Leahy Scale: Good     Standing balance support: Single extremity supported  Standing balance-Leahy Scale: Poor Standing balance comment: reliant on external support                           ADL either performed or assessed with clinical judgement   ADL Overall ADL's : Needs assistance/impaired Eating/Feeding: Supervision/ safety;Set up;Sitting   Grooming: Sitting;Supervision/safety;Set up    Upper Body Bathing: Supervision/ safety;Set up;Sitting   Lower Body Bathing: Minimal assistance;Sit to/from stand;Sitting/lateral leans   Upper Body Dressing : Minimal assistance   Lower Body Dressing: Moderate assistance Lower Body Dressing Details (indicate cue type and reason): Pt able to doff and don RT sock with increased time and effort using figure 4. Pt too fatigued to attempt other sock.   Toilet Transfer Details (indicate cue type and reason): Pt denied bathroom needs. See mobility for trasfer from recliner. Toileting- Clothing Manipulation and Hygiene: Minimal assistance Toileting - Clothing Manipulation Details (indicate cue type and reason): Based on general assessment.     Functional mobility during ADLs: Minimal assistance;Cueing for safety       Vision Baseline Vision/History: Legally blind Patient Visual Report: No change from baseline Additional Comments: Spouse educated on free audio-book resources for home through [employer].     Perception     Praxis      Pertinent Vitals/Pain Pain Assessment: No/denies pain     Hand Dominance Right   Extremity/Trunk Assessment Upper Extremity Assessment Upper Extremity Assessment: Generalized weakness   Lower Extremity Assessment Lower Extremity Assessment: Generalized weakness   Cervical / Trunk Assessment Cervical / Trunk Assessment: Normal   Communication Communication Communication: HOH   Cognition Arousal/Alertness: Awake/alert Behavior During Therapy: WFL for tasks assessed/performed Overall Cognitive Status: History of cognitive impairments - at baseline                                 General Comments: pt pleasant and following 1-step commands, HOH and impaired vision possibly limiting   General Comments       Exercises     Shoulder Instructions      Home Living Family/patient expects to be discharged to:: Skilled nursing facility Living Arrangements: Spouse/significant  other Available Help at Discharge: Other (Comment);Available 24 hours/day (NEarly 24/7) Type of Home: House Home Access: Stairs to enter [employer] of Steps: 3 Entrance Stairs-Rails: Can reach both;Right;Left Home Layout: One level     Bathroom Shower/Tub: Chief Strategy Officer: Standard     Home Equipment: Shower seat;Hand held Careers information officer - 4 wheels   Additional Comments: Spouse reports there are cameras in every room so that she can keep an eye on the pt when she leaves for errands. Spouse reports that she tells pt to stay in his chair when she leaves, and he usually complies.      Prior Functioning/Environment Level of Independence: Needs assistance  Gait / Transfers Assistance Needed: Spouse reports that pt usually refuses to use his rollator, and ambulates in the house independently-does not reach for furniture and usually looks steady. ADL's / Homemaking Assistance Needed: Spouse assists the pt with bathing. Pt performs all other BADLs without assistance.  Spouse takes care of transportation, medication and all IADLs.            OT Problem List: Decreased strength;Decreased activity tolerance;Decreased safety awareness;Decreased knowledge of use of DME or AE;Impaired balance (sitting and/or standing);Decreased knowledge of precautions;Impaired vision/perception  OT Treatment/Interventions: Self-care/ADL training;Therapeutic exercise;Therapeutic activities;Visual/perceptual remediation/compensation;DME and/or AE instruction;Patient/family education;Balance training    OT Goals(Current goals can be found in the care plan section) Acute Rehab OT Goals Patient Stated Goal: Per spouse, for pt to return to baseline OT Goal Formulation: With patient/family Time For Goal Achievement: 05/17/20 Potential to Achieve Goals: Good ADL Goals Pt Will Perform Grooming: with supervision;standing Pt Will Perform Lower Body Dressing: with set-up;with  supervision;sit to/from stand;sitting/lateral leans Pt Will Transfer to Toilet: with supervision;ambulating Pt Will Perform Toileting - Clothing Manipulation and hygiene: with supervision;sitting/lateral leans;sit to/from stand Pt/caregiver will Perform Home Exercise Program: Increased strength;Both right and left upper extremity;With Supervision  OT Frequency: Min 2X/week   Barriers to D/C:    Spouse has little outside support available. States that a grandson of pt's may be able to assist.       Co-evaluation              AM-PAC OT "6 Clicks" Daily Activity     Outcome Measure Help from another person eating meals?: A Little Help from another person taking care of personal grooming?: A Little Help from another person toileting, which includes using toliet, bedpan, or urinal?: A Little Help from another person bathing (including washing, rinsing, drying)?: A Lot Help from another person to put on and taking off regular upper body clothing?: A Little Help from another person to put on and taking off regular lower body clothing?: A Lot 6 Click Score: 16   End of Session Equipment Utilized During Treatment: Gait belt  Activity Tolerance: Patient tolerated treatment well Patient left: in chair;with call bell/phone within reach;with chair alarm set;with family/visitor present  OT Visit Diagnosis: Unsteadiness on feet (R26.81);Repeated falls (R29.6);History of falling (Z91.81);Low vision, both eyes (H54.2)                Time: 3154-0086 OT Time Calculation (min): 27 min Charges:  OT General Charges $OT Visit: 1 Visit OT Evaluation $OT Eval Low Complexity: 1 Low OT Treatments $Therapeutic Activity: 8-22 mins  Victorino Dike, OT Acute Rehab Services Office: [phone] 05/03/2020  Theodoro Clock 05/03/2020, 11:39 AM

## 2020-05-03 NOTE — TOC Progression Note (Signed)
Transition of Care (TOC) - Progression Note    Patient Details  Name: Grant Jensen. MRN: [id number] Date of Birth: [birth year]  Transition of Care [facility]) CM/SW Contact  Geni Bers, RN Phone Number: 05/03/2020, 3:56 PM  Clinical Narrative:     [facility] declined pt related to not having second vaccination. Wife was made aware. Wife will check on another facility.       Expected Discharge Plan and Services                                                 Social Determinants of Health (SDOH) Interventions    Readmission Risk Interventions No flowsheet data found.

## 2020-05-03 NOTE — Evaluation (Signed)
Clinical/Bedside Swallow Evaluation Patient Details  Name: Caius Silbernagel. MRN: [id number] Date of Birth: 08/21/1928  Today's Date: 05/03/2020 Time: SLP Start Time (ACUTE ONLY): 1420 SLP Stop Time (ACUTE ONLY): 1440 SLP Time Calculation (min) (ACUTE ONLY): 20 min  Past Medical History:  Past Medical History:  Diagnosis Date  . Diabetes mellitus without complication (HCC)   . Glaucoma    Past Surgical History:  Past Surgical History:  Procedure Laterality Date  . LEFT HEART CATHETERIZATION WITH CORONARY ANGIOGRAM N/A 06/22/2014   Procedure: LEFT HEART CATHETERIZATION WITH CORONARY ANGIOGRAM;  Surgeon: Yates Decamp, MD;  Location: [facility] CATH LAB;  Service: Cardiovascular;  Laterality: N/A;   HPI:  87 year old male with a past medical history of diabetes, hypertension, hyperlipidemia, angina, NICM, dementia, blindness reported per wife who presented to the ED with a chief complaint of fall.  Wife states that she believes the patient ingested Windex window cleaner on Thursday evening around 5 PM has she found a half empty bottle of solution near him.  States that he does frequently try to ingest things due to his dementia and is not suicidal and does not have depression.  She states that soon after he vomited around the house.  They called poison control on were told to watch for signs of aspiration.  She states that Thursday evening he was on the floor but she had to leave and when she returned home on Friday she found him still on the floor and with generalized weakness and she was unable to get him off the floor prompting her to call EMS.  She does not believe that he syncopized but is unsure.  In the ED, the patient denied any complaints to the ED provider but had an antalgic gait with ambulation, prompting imaging which was unremarkable."   Assessment / Plan / Recommendation Clinical Impression  Patient presents with an oropharyngeal swallow that appears [facility] as per somewhat limited assessment.  Patient did not participate in oral motor exam or oral care and would only eat/drink when he chose to which was minimally overall. Patient's wife present for education and provided background on patient's swallow/eating habits. As patient appears with moderate-severe dementia, recommend not altering diet but to offer foods patient normally would choose. He did pick up food with fingers and so finger foods might be a good option if patient not eating enough. SLP Visit Diagnosis: Dysphagia, unspecified (R13.10)    Aspiration Risk  No limitations;Mild aspiration risk    Diet Recommendation Regular;Thin liquid   Liquid Administration via: Cup;Straw Medication Administration: Whole meds with puree Supervision: Full supervision/cueing for compensatory strategies Compensations: Minimize environmental distractions;Small sips/bites;Slow rate Postural Changes: Seated upright at 90 degrees    Other  Recommendations Oral Care Recommendations: Oral care BID   Follow up Recommendations None;24 hour supervision/assistance      Frequency and Duration   N/A         Prognosis    N/A    Swallow Study   General Date of Onset: 05/01/20 HPI: 87 year old male with a past medical history of diabetes, hypertension, hyperlipidemia, angina, NICM, dementia, blindness reported per wife who presented to the ED with a chief complaint of fall.  Wife states that she believes the patient ingested Windex window cleaner on Thursday evening around 5 PM has she found a half empty bottle of solution near him.  States that he does frequently try to ingest things due to his dementia and is not suicidal and does not have depression.  She states that soon after he vomited around the house.  They called poison control on were told to watch for signs of aspiration.  She states that Thursday evening he was on the floor but she had to leave and when she returned home on Friday she found him still on the floor and with generalized  weakness and she was unable to get him off the floor prompting her to call EMS.  She does not believe that he syncopized but is unsure.  In the ED, the patient denied any complaints to the ED provider but had an antalgic gait with ambulation, prompting imaging which was unremarkable." Type of Study: Bedside Swallow Evaluation Previous Swallow Assessment: None found Diet Prior to this Study: Regular;Thin liquids Temperature Spikes Noted: No Respiratory Status: Room air History of Recent Intubation: No Behavior/Cognition: Alert;Cooperative;Pleasant mood;Confused;Requires cueing Oral Cavity Assessment: Other (comment) (did not cooperate) Oral Care Completed by SLP: No Oral Cavity - Dentition: Edentulous Vision: Functional for self-feeding Self-Feeding Abilities: Able to feed self Patient Positioning: Upright in chair Baseline Vocal Quality: Normal Volitional Cough: Cognitively unable to elicit Volitional Swallow: Unable to elicit    Oral/Motor/Sensory Function Overall Oral Motor/Sensory Function: Other (comment) (limited assessment secondary to patient not participatory but did not appear with any significant oral motor impairment)   Ice Chips     Thin Liquid Thin Liquid: Within functional limits Presentation: Straw;Self Fed    Nectar Thick     Honey Thick     Puree     Solid     Solid: Within functional limits Presentation: Self Fed Other Comments: Patient constantly chewing as he has bubble gum in mouth. He picked up a piece of meatloaf and started eating it. Likely has prolonged mastication at baseline      Angela Nevin, MA, CCC-SLP Speech Therapy

## 2020-05-03 NOTE — Plan of Care (Signed)
  Problem: Health Behavior/Discharge Planning: Goal: Ability to manage health-related needs will improve Outcome: Progressing   Problem: Clinical Measurements: Goal: Ability to maintain clinical measurements within normal limits will improve Outcome: Progressing Goal: Will remain free from infection Outcome: Progressing Goal: Diagnostic test results will improve Outcome: Progressing Goal: Respiratory complications will improve Outcome: Progressing Goal: Cardiovascular complication will be avoided Outcome: Progressing   Problem: Pain Managment: Goal: General experience of comfort will improve Outcome: Progressing   Problem: Safety: Goal: Ability to remain free from injury will improve Outcome: Progressing   

## 2020-05-03 NOTE — Progress Notes (Addendum)
PROGRESS NOTE    Grant Jensen.  DTO:[id number] DOB: [birth year] DOA: 04/30/2020 PCP: [name], MD    Brief Narrative:  Mr. Crescenzo was admitted to the hospital with a working diagnosis of traumatic rhabdomyolysis.  87 year old male past medical history for type 2 diabetes mellitus, hypertension, dyslipidemia, nonischemic cardiomyopathy, blindness and dementia who presented after mechanical fall.  Per his wife his dementia has been progressive and worsening, 48 hours before admission he ingested Windex window cleaner with immediate vomiting.  Poison control was contacted with instructions to watch for signs of aspiration.  The same day during the evening hours apparently patient fell and remained on the floor until the following day when he was found with generalized weakness and unable to stand.  EMS was called and patient was brought to the hospital.  On his initial physical examination blood pressure 152/111, heart rate 81, temperature 98.9, respiratory rate 16, oxygen saturation 100%, his lungs are clear to auscultation, heart S1-S2, present rhythmic, soft abdomen, no lower extremity edema patient was confused and disoriented (at his baseline).   Sodium 138, potassium 4.1, chloride 102, bicarb 23, glucose 121, BUN 17, creatinine six 1.21, anion gap 13, CK 1664-2220, white count 8.1, hemoglobin 12.3, hematocrit 40.0, platelets 217. SARS COVID-19 negative. Head CT no acute changes. Radiograph lumbar pelvic spine negative for acute fracture.   Assessment & Plan:   Principal Problem:   Rhabdomyolysis Active Problems:   Dementia (HCC)   Fall at home, initial encounter   Diabetes [facility])   Essential hypertension   NICM (nonischemic cardiomyopathy) (HCC)    1. Traumatic rhabdomyolysis in the setting of CKD stage 3a. Tolerating well IV fluids. CK today is 887, serum cr stable at 1.30 with K at 4,1 and bicarbonate 24. No clinical signs of volume overload.   Continue with IV  fluids with 0.45% saline, decrease rate to 50 ml per hr and follow up on renal panel in am, including CK.  Patient very weak and deconditioned, PT /OT has recommended SNF but her wife wants to take him home with home health services.  Possible plan for dc home in am.   2, T2DM. Fasting glucose this am is 89, capillary 71,83 and 134, continue sliding scale for glucose cover and monitoring.   3. HTN and CAD. On aspirin and isosorbide.   4. Dementia with ambulatory dysfunction. Acute metabolic encephalopathy with episodic delirium. Patient had one dose of quetiapine on admission. No further agitation.   His wife is at the bedside.  She reports patient having difficulty swallowing and frequent spitting.  Consult speech for swallow evaluation. Patient with progressive dementia possible underlying swallow dysfunction.    Status is: Inpatient  Remains inpatient appropriate because:IV treatments appropriate due to intensity of illness or inability to take PO   Dispo: The patient is from: Home              Anticipated d/c is to: Home              Patient currently is not medically stable to d/c.   Difficult to place patient No   DVT prophylaxis: Enoxaparin   Code Status:   DNR   Family Communication:  I spoke with patient's wife at the bedside, we talked in detail about patient's condition, plan of care and prognosis and all questions were addressed.      Subjective: Patient is feeling better but not yet back to baseline, continue to be very weak and deconditioned. No agitation.  Objective: Vitals:   05/02/20 1258 05/02/20 2056 05/02/20 2125 05/03/20 0438  BP: 114/60 111/68  112/61  Pulse: 87 84  72  Resp: 19 18  18   Temp: 98.2 F (36.8 C) 100.3 F (37.9 C) 99.1 F (37.3 C) 98.8 F (37.1 C)  TempSrc: Oral Oral Oral Oral  SpO2: 100% 100%  100%  Weight:      Height:        Intake/Output Summary (Last 24 hours) at 05/03/2020 1036 Last data filed at 05/03/2020 1022 Gross  per 24 hour  Intake 1705.95 ml  Output -  Net 1705.95 ml   Filed Weights   04/30/20 1943 05/01/20 1332  Weight: 65.8 kg 69.2 kg    Examination:   General: Not in pain or dyspnea, deconditioned  Neurology: Awake and alert, non focal  E ENT: mild pallor, no icterus, oral mucosa moist Cardiovascular: No JVD. S1-S2 present, rhythmic, no gallops, rubs, or murmurs. No lower extremity edema. Pulmonary: positive breath sounds bilaterally, with no wheezing, rhonchi or rales. Gastrointestinal. Abdomen soft and non tender Skin. No rashes Musculoskeletal: no joint deformities     Data Reviewed: I have personally reviewed following labs and imaging studies  CBC: Recent Labs  Lab 04/30/20 2130 05/02/20 0442  WBC 8.1 6.4  NEUTROABS 4.7  --   HGB 12.3* 10.9*  HCT 40.0 35.7*  MCV 79.1* 80.8  PLT 217 188   Basic Metabolic Panel: Recent Labs  Lab 04/30/20 2130 05/02/20 0442 05/03/20 0410  NA 138 140 138  K 4.1 4.1 4.1  CL 102 107 105  CO2 23 24 24   GLUCOSE 121* 95 89  BUN 17 15 15   CREATININE 1.21 1.28* 1.30*  CALCIUM 9.6 8.8* 8.8*   GFR: Estimated Creatinine Clearance: 36 mL/min (A) (by C-G formula based on SCr of 1.3 mg/dL (H)). Liver Function Tests: Recent Labs  Lab 04/30/20 2130  AST 46*  ALT 19  ALKPHOS 73  BILITOT 1.1  PROT 7.8  ALBUMIN 4.4   Recent Labs  Lab 04/30/20 2130  LIPASE 29   No results for input(s): AMMONIA in the last 168 hours. Coagulation Profile: No results for input(s): INR, PROTIME in the last 168 hours. Cardiac Enzymes: Recent Labs  Lab 04/30/20 2130 05/01/20 1047 05/02/20 0442 05/03/20 0410  CKTOTAL 1,664* 2,220* 1,767* 887*   BNP (last 3 results) No results for input(s): PROBNP in the last 8760 hours. HbA1C: Recent Labs    05/01/20 1232  HGBA1C 7.0*   CBG: Recent Labs  Lab 05/02/20 1143 05/02/20 1612 05/02/20 2053 05/03/20 0747 05/03/20 0837  GLUCAP 122* 91 96 71 83   Lipid Profile: No results for input(s):  CHOL, HDL, LDLCALC, TRIG, CHOLHDL, LDLDIRECT in the last 72 hours. Thyroid Function Tests: Recent Labs    05/01/20 1232  TSH 2.866   Anemia Panel: No results for input(s): VITAMINB12, FOLATE, FERRITIN, TIBC, IRON, RETICCTPCT in the last 72 hours.    Radiology Studies: I have reviewed all of the imaging during this hospital visit personally     Scheduled Meds: . aspirin EC  81 mg Oral Daily  . enoxaparin (LOVENOX) injection  40 mg Subcutaneous Q24H  . insulin aspart  0-9 Units Subcutaneous TID WC  . isosorbide mononitrate  30 mg Oral Daily  . sodium chloride flush  3 mL Intravenous Q12H   Continuous Infusions: . sodium chloride 100 mL/hr at 05/03/20 1022     LOS: 1 day        Mauricio 05/05/20,  MD   

## 2020-05-03 NOTE — Progress Notes (Signed)
Nutrition Brief Note  RD consulted for nutritional assessment.  Pt currently consuming 75-100%. Per MD note, pt may d/c 3/1.  Wt Readings from Last 15 Encounters:  05/01/20 69.2 kg  06/22/14 63.5 kg  05/08/14 72.6 kg    Body mass index is 23.89 kg/m. Patient meets criteria for normal based on current BMI.   Current diet order is heart healthy/CHO modified , patient is consuming approximately 75-100% of meals at this time. Labs and medications reviewed.   No nutrition interventions warranted at this time. If nutrition issues arise, please consult RD.   Tilda Franco, MS, RD, LDN Inpatient Clinical Dietitian Contact information available via Amion

## 2020-05-04 DIAGNOSIS — I10 Essential (primary) hypertension: Secondary | ICD-10-CM | POA: Diagnosis not present

## 2020-05-04 DIAGNOSIS — E119 Type 2 diabetes mellitus without complications: Secondary | ICD-10-CM | POA: Diagnosis not present

## 2020-05-04 DIAGNOSIS — F0390 Unspecified dementia without behavioral disturbance: Secondary | ICD-10-CM | POA: Diagnosis not present

## 2020-05-04 DIAGNOSIS — T796XXA Traumatic ischemia of muscle, initial encounter: Secondary | ICD-10-CM | POA: Diagnosis not present

## 2020-05-04 LAB — BASIC METABOLIC PANEL
Anion gap: 9 (ref 5–15)
BUN: 12 mg/dL (ref 8–23)
CO2: 22 mmol/L (ref 22–32)
Calcium: 8.6 mg/dL — ABNORMAL LOW (ref 8.9–10.3)
Chloride: 106 mmol/L (ref 98–111)
Creatinine, Ser: 1.28 mg/dL — ABNORMAL HIGH (ref 0.61–1.24)
GFR, Estimated: 53 mL/min — ABNORMAL LOW (ref >60)
Glucose, Bld: 111 mg/dL — ABNORMAL HIGH (ref 70–99)
Potassium: 4.3 mmol/L (ref 3.5–5.1)
Sodium: 137 mmol/L (ref 135–145)

## 2020-05-04 LAB — GLUCOSE, CAPILLARY
Glucose-Capillary: 100 mg/dL — ABNORMAL HIGH (ref 70–99)
Glucose-Capillary: 127 mg/dL — ABNORMAL HIGH (ref 70–99)
Glucose-Capillary: 100 mg/dL — ABNORMAL HIGH (ref 70–99)

## 2020-05-04 LAB — SARS CORONAVIRUS 2 BY RT PCR (HOSPITAL ORDER, PERFORMED IN ~~LOC~~ HOSPITAL LAB): SARS Coronavirus 2: NEGATIVE

## 2020-05-04 LAB — SARS CORONAVIRUS 2 (TAT 6-24 HRS): SARS Coronavirus 2: NEGATIVE

## 2020-05-04 LAB — CK: Total CK: 572 U/L — ABNORMAL HIGH (ref 49–397)

## 2020-05-04 MED ORDER — ACETAMINOPHEN 325 MG PO TABS: 650.0000 mg | ORAL_TABLET | Freq: Four times a day (QID) | ORAL | Status: DC | PRN

## 2020-05-04 MED ORDER — COVID-19 MRNA VAC-TRIS(PFIZER) 30 MCG/0.3ML IM SUSP
0.3000 mL | Freq: Once | INTRAMUSCULAR | Status: AC
  Administered 2020-05-04: 0.3 mL via INTRAMUSCULAR
  Filled 2020-05-04: qty 0.3

## 2020-05-04 NOTE — Progress Notes (Signed)
Pt's COVID test negative. PTAR called to update and report called to [facility]. Pt bathed and IV removed. Awaiting transport.

## 2020-05-04 NOTE — Discharge Summary (Addendum)
Physician Discharge Summary  Grant Jensen. YSA:[id number] DOB: [birth year] DOA: 04/30/2020  PCP: [name], MD  Admit date: 04/30/2020 Discharge date: 05/04/2020  Admitted From: Home  Disposition:  SNF  Recommendations for Outpatient Follow-up and new medication changes:  1. Follow up with Dr. [name] in 7 days.  2. Discontinue ibuprofen.   Home Health: na  Equipment/Devices: na    Discharge Condition: stable  CODE STATUS: DNR   Diet recommendation:  Heart healthy   Brief/Interim Summary: [name] was admitted to the hospital with a working diagnosis of traumatic rhabdomyolysis.  87 year old male past medical history for type 2 diabetes mellitus, hypertension, dyslipidemia, nonischemic cardiomyopathy, blindness and dementia who presented after mechanical fall. Per his wife his dementia has been progressive and worsening, 48 hours before admission he ingested Windex window cleaner with immediate vomiting. Poison control was contacted with instructions to watch for signs of aspiration. The same day during the evening hours apparently patient fell and remained on the floor until the following day when he was found with generalized weakness and unable to stand. EMS was called and patient was brought to the hospital. On his initial physical examination blood pressure 152/111, heart rate 81, temperature 98.9, respiratory rate 16, oxygen saturation 100%, his lungs were clear to auscultation, heart S1-S2, present rhythmic, soft abdomen, no lower extremity edema patient was confused and disoriented (at his baseline).  Sodium 138, potassium 4.1, chloride 102, bicarb 23, glucose 121, BUN 17, creatinine 1.21, anion gap 13, CK [id number], white count 8.1, hemoglobin 12.3, hematocrit 40.0, platelets 217. SARS COVID-19 negative. Head CT no acute changes. Radiograph lumbar pelvic spine negative for acute fracture.  Patient was placed on IV fluids and neuro checks. His renal function  remained stable, with improvement of CK.  Patient very weak and deconditioned, transfer to SNF to continue his recovery.  1.  Traumatic rhabdomyolysis in the setting of chronic kidney disease stage IIIa. Patient was admitted to the medical ward, patient received aggressive intravenous fluids with further improvement of CK.  His renal function remained stable. Discharge sodium is 137, potassium 4.3, chloride 102, bicarb 22, glucose 111, BUN 12, creatinine 1.28, CK 572.  He was evaluated by physical therapy, he will be transferred to skilled nursing facility.  2.  Controlled type 2 diabetes mellitus.  His glucose remained stable during his hospitalization, he received insulin sliding scale for glucose coverage and monitoring. At discharge will resume metformin.  3.  Hypertension.  Coronary artery disease. Non ischemic cardiomyopathy  Patient remained free of chest pain, continue aspirin and isosorbide. No clinical signs of heart failure.   4.  Progressive dementia, ambulatory dysfunction, acute metabolic encephalopathy with episodic delirium. On admission patient received quetiapine with good toleration.  The rest of his hospitalization he remained calm.  He was evaluated by speech therapy with recommendations to continue regular diet, he will need 24-hour supervision/assistance.      Discharge Diagnoses:  Principal Problem:   Rhabdomyolysis Active Problems:   Dementia (HCC)   Fall at home, initial encounter   Diabetes [facility])   Essential hypertension   NICM (nonischemic cardiomyopathy) (HCC)    Discharge Instructions   Allergies as of 05/04/2020      Reactions   Lisinopril Cough      Medication List    STOP taking these medications   ibuprofen 200 MG tablet Commonly known as: ADVIL     TAKE these medications   acetaminophen 325 MG tablet Commonly known as: TYLENOL Take 2 tablets (  650 mg total) by mouth every 6 (six) hours as needed for moderate pain.   aspirin EC 81  MG tablet Take 81 mg by mouth daily.   isosorbide mononitrate 30 MG 24 hr tablet Commonly known as: IMDUR Take 30 mg by mouth daily.   metFORMIN 500 MG tablet Commonly known as: GLUCOPHAGE Take 1 tablet (500 mg total) by mouth daily with breakfast.   omeprazole 20 MG capsule Commonly known as: PRILOSEC Take 20 mg by mouth daily.       Allergies  Allergen Reactions   Lisinopril Cough      Procedures/Studies: DG Lumbar Spine 2-3 Views  Result Date: 05/01/2020 CLINICAL DATA:  Fall today with back pain EXAM: LUMBAR SPINE - 2-3 VIEW COMPARISON:  MRI lumbar spine December 07, 2004 FINDINGS: There is no evidence of lumbar spine fracture. Alignment is normal. Multilevel degenerative change of the lumbar spine with disc space narrowing, vacuum disc artifact, facet hypertrophy and osteophytosis. IMPRESSION: No acute osseous abnormality. Multilevel degenerative change of the lumbar spine. Electronically Signed   By: Maudry Mayhew MD   On: 05/01/2020 09:09   DG Pelvis 1-2 Views  Result Date: 05/01/2020 CLINICAL DATA:  Un witnessed fall. EXAM: PELVIS - 1-2 VIEW COMPARISON:  None. FINDINGS: There is no evidence of pelvic fracture or diastasis. No pelvic bone lesions are seen. Pelvic phleboliths. Vascular calcifications. IMPRESSION: No acute osseous abnormality. Electronically Signed   By: Maudry Mayhew MD   On: 05/01/2020 09:08   CT Head Wo Contrast  Result Date: 04/30/2020 CLINICAL DATA:  Change in mental status EXAM: CT HEAD WITHOUT CONTRAST TECHNIQUE: Contiguous axial images were obtained from the base of the skull through the vertex without intravenous contrast. COMPARISON:  None. FINDINGS: Brain: No evidence of acute territorial infarction, hemorrhage, hydrocephalus,extra-axial collection or mass lesion/mass effect. There is dilatation the ventricles and sulci consistent with age-related atrophy. Low-attenuation changes in the deep white matter consistent with small vessel ischemia.  Vascular: No hyperdense vessel or unexpected calcification. Skull: The skull is intact. No fracture or focal lesion identified. Sinuses/Orbits: The visualized paranasal sinuses and mastoid air cells are clear. The orbits and globes intact. Other: None IMPRESSION: No acute intracranial abnormality. Findings consistent with age related atrophy and chronic small vessel ischemia Electronically Signed   By: Jonna Clark M.D.   On: 04/30/2020 21:14        Subjective: Patient is feeling better, tolerating po well, no nausea or vomiting, no chest pain or dyspnea. Mentation at his baseline, continue to be very weak and deconditioned.   Discharge Exam: Vitals:   05/03/20 2105 05/04/20 0652  BP: 122/62 114/61  Pulse: 78 69  Resp: 17 19  Temp: 98.3 F (36.8 C) (!) 97.5 F (36.4 C)  SpO2: 100% 100%   Vitals:   05/03/20 0438 05/03/20 1253 05/03/20 2105 05/04/20 0652  BP: 112/61 (!) 148/83 122/62 114/61  Pulse: 72 84 78 69  Resp: 18 18 17 19   Temp: 98.8 F (37.1 C) 99.1 F (37.3 C) 98.3 F (36.8 C) (!) 97.5 F (36.4 C)  TempSrc: Oral Oral Oral Oral  SpO2: 100% 100% 100% 100%  Weight:      Height:        General: Not in pain or dyspnea.  Neurology: Awake and alert, non focal  E ENT: mild pallor, no icterus, oral mucosa moist Cardiovascular: No JVD. S1-S2 present, rhythmic, no gallops, rubs, or murmurs. No lower extremity edema. Pulmonary: positive breath sounds bilaterally, adequate air movement, no  wheezing, rhonchi or rales. Gastrointestinal. Abdomen soft and non tender Skin. No rashes Musculoskeletal: no joint deformities   The results of significant diagnostics from this hospitalization (including imaging, microbiology, ancillary and laboratory) are listed below for reference.     Microbiology: Recent Results (from the past 240 hour(s))  SARS CORONAVIRUS 2 (TAT 6-24 HRS) Nasopharyngeal Nasopharyngeal Swab     Status: None   Collection Time: 05/01/20 12:32 PM   Specimen:  Nasopharyngeal Swab  Result Value Ref Range Status   SARS Coronavirus 2 NEGATIVE NEGATIVE Final    Comment: (NOTE) SARS-CoV-2 target nucleic acids are NOT DETECTED.  The SARS-CoV-2 RNA is generally detectable in upper and lower respiratory specimens during the acute phase of infection. Negative results do not preclude SARS-CoV-2 infection, do not rule out co-infections with other pathogens, and should not be used as the sole basis for treatment or other patient management decisions. Negative results must be combined with clinical observations, patient history, and epidemiological information. The expected result is Negative.  Fact Sheet for Patients: HairSlick.no  Fact Sheet for Healthcare Providers: quierodirigir.com  This test is not yet approved or cleared by the [place] FDA and  has been authorized for detection and/or diagnosis of SARS-CoV-2 by FDA under an Emergency Use Authorization (EUA). This EUA will remain  in effect (meaning this test can be used) for the duration of the COVID-19 declaration under Se ction 564(b)(1) of the Act, 21 U.S.C. section 360bbb-3(b)(1), unless the authorization is terminated or revoked sooner.  Performed at [facility], [address], [place], [place] [address]      Labs: BNP (last 3 results) No results for input(s): BNP in the last 8760 hours. Basic Metabolic Panel: Recent Labs  Lab 04/30/20 2130 05/02/20 0442 05/03/20 0410 05/04/20 0404  NA 138 140 138 137  K 4.1 4.1 4.1 4.3  CL 102 107 105 106  CO2 23 24 24 22   GLUCOSE 121* 95 89 111*  BUN 17 15 15 12   CREATININE 1.21 1.28* 1.30* 1.28*  CALCIUM 9.6 8.8* 8.8* 8.6*   Liver Function Tests: Recent Labs  Lab 04/30/20 2130  AST 46*  ALT 19  ALKPHOS 73  BILITOT 1.1  PROT 7.8  ALBUMIN 4.4   Recent Labs  Lab 04/30/20 2130  LIPASE 29   No results for input(s): AMMONIA in the last 168  hours. CBC: Recent Labs  Lab 04/30/20 2130 05/02/20 0442  WBC 8.1 6.4  NEUTROABS 4.7  --   HGB 12.3* 10.9*  HCT 40.0 35.7*  MCV 79.1* 80.8  PLT 217 188   Cardiac Enzymes: Recent Labs  Lab 04/30/20 2130 05/01/20 1047 05/02/20 0442 05/03/20 0410 05/04/20 0404  CKTOTAL 1,664* 2,220* 1,767* 887* 572*   BNP: Invalid input(s): POCBNP CBG: Recent Labs  Lab 05/03/20 0837 05/03/20 1203 05/03/20 1716 05/03/20 2107 05/04/20 0728  GLUCAP 83 134* 111* 110* 100*   D-Dimer No results for input(s): DDIMER in the last 72 hours. Hgb A1c Recent Labs    05/01/20 1232  HGBA1C 7.0*   Lipid Profile No results for input(s): CHOL, HDL, LDLCALC, TRIG, CHOLHDL, LDLDIRECT in the last 72 hours. Thyroid function studies Recent Labs    05/01/20 1232  TSH 2.866   Anemia work up No results for input(s): VITAMINB12, FOLATE, FERRITIN, TIBC, IRON, RETICCTPCT in the last 72 hours. Urinalysis No results found for: COLORURINE, APPEARANCEUR, LABSPEC, PHURINE, GLUCOSEU, HGBUR, BILIRUBINUR, KETONESUR, PROTEINUR, UROBILINOGEN, NITRITE, LEUKOCYTESUR Sepsis Labs Invalid input(s): PROCALCITONIN,  WBC,  LACTICIDVEN Microbiology  Recent Results (from the past 240 hour(s))  SARS CORONAVIRUS 2 (TAT 6-24 HRS) Nasopharyngeal Nasopharyngeal Swab     Status: None   Collection Time: 05/01/20 12:32 PM   Specimen: Nasopharyngeal Swab  Result Value Ref Range Status   SARS Coronavirus 2 NEGATIVE NEGATIVE Final    Comment: (NOTE) SARS-CoV-2 target nucleic acids are NOT DETECTED.  The SARS-CoV-2 RNA is generally detectable in upper and lower respiratory specimens during the acute phase of infection. Negative results do not preclude SARS-CoV-2 infection, do not rule out co-infections with other pathogens, and should not be used as the sole basis for treatment or other patient management decisions. Negative results must be combined with clinical observations, patient history, and epidemiological  information. The expected result is Negative.  Fact Sheet for Patients: HairSlick.no  Fact Sheet for Healthcare Providers: quierodirigir.com  This test is not yet approved or cleared by the [place] FDA and  has been authorized for detection and/or diagnosis of SARS-CoV-2 by FDA under an Emergency Use Authorization (EUA). This EUA will remain  in effect (meaning this test can be used) for the duration of the COVID-19 declaration under Se ction 564(b)(1) of the Act, 21 U.S.C. section 360bbb-3(b)(1), unless the authorization is terminated or revoked sooner.  Performed at [facility], [address], [place], [place] [address]      Time coordinating discharge: 45 minutes  SIGNED:   Coralie Keens, MD  [facility] 05/04/2020, 10:18 AM

## 2020-05-04 NOTE — Progress Notes (Signed)
Pt transferred from 4W by [facility] to [employer].

## 2020-05-04 NOTE — Plan of Care (Signed)
  Problem: Clinical Measurements: Goal: Will remain free from infection Outcome: Progressing Goal: Diagnostic test results will improve Outcome: Progressing Goal: Respiratory complications will improve Outcome: Progressing Goal: Cardiovascular complication will be avoided Outcome: Progressing   Problem: Nutrition: Goal: Adequate nutrition will be maintained Outcome: Progressing   Problem: Coping: Goal: Level of anxiety will decrease Outcome: Progressing   Problem: Elimination: Goal: Will not experience complications related to urinary retention Outcome: Progressing   Problem: Pain Managment: Goal: General experience of comfort will improve Outcome: Progressing   Problem: Safety: Goal: Ability to remain free from injury will improve Outcome: Progressing   Problem: Skin Integrity: Goal: Risk for impaired skin integrity will decrease Outcome: Progressing   

## 2020-05-04 NOTE — TOC Progression Note (Signed)
Transition of Care (TOC) - Progression Note    Patient Details  Name: Grant Jensen. MRN: [id number] Date of Birth: [birth year]  [facility]) CM/SW Contact  Geni Bers, RN Phone Number: 05/04/2020, 10:56 AM  Clinical Narrative:    Spoke with pt's wife [name] concerning discharge to SNF. [name] asked that his daughter [name] be called. Spoke with [name] concerning bed offers. [facility] offered a bed. MD and this CM spoke with [facility] concerning discharge to SNF. Wife [name] agreed.        Expected Discharge Plan and Services                                                 Social Determinants of Health (SDOH) Interventions    Readmission Risk Interventions No flowsheet data found.

## 2020-05-04 NOTE — TOC Progression Note (Signed)
Transition of Care (TOC) - Progression Note    Patient Details  Name: Grant Jensen. MRN: [id number] Date of Birth: 10/08/1928  Transition of Care [facility]) CM/SW Contact  Geni Bers, RN Phone Number: 05/04/2020, 1:12 PM  Clinical Narrative:    [name] was called pt is on Will call list. Waiting for COVID test.         Expected Discharge Plan and Services           Expected Discharge Date: 05/04/20                                     Social Determinants of Health (SDOH) Interventions    Readmission Risk Interventions No flowsheet data found.

## 2020-05-04 NOTE — Plan of Care (Signed)
  Problem: Coping: Goal: Level of anxiety will decrease Outcome: Progressing   Problem: Nutrition: Goal: Adequate nutrition will be maintained Outcome: Progressing   Problem: Activity: Goal: Risk for activity intolerance will decrease Outcome: Progressing   

## 2020-07-04 DIAGNOSIS — M25551 Pain in right hip: Secondary | ICD-10-CM | POA: Diagnosis not present

## 2020-07-04 DIAGNOSIS — I10 Essential (primary) hypertension: Secondary | ICD-10-CM | POA: Diagnosis not present

## 2020-07-04 DIAGNOSIS — F0390 Unspecified dementia without behavioral disturbance: Secondary | ICD-10-CM | POA: Diagnosis not present

## 2020-07-04 DIAGNOSIS — M6281 Muscle weakness (generalized): Secondary | ICD-10-CM | POA: Diagnosis not present

## 2020-07-04 DIAGNOSIS — R531 Weakness: Secondary | ICD-10-CM | POA: Diagnosis not present

## 2020-07-04 DIAGNOSIS — W19XXXA Unspecified fall, initial encounter: Secondary | ICD-10-CM | POA: Diagnosis not present

## 2020-07-04 DIAGNOSIS — M25552 Pain in left hip: Secondary | ICD-10-CM | POA: Diagnosis not present

## 2020-07-04 DIAGNOSIS — M47816 Spondylosis without myelopathy or radiculopathy, lumbar region: Secondary | ICD-10-CM | POA: Diagnosis not present

## 2020-07-04 DIAGNOSIS — R059 Cough, unspecified: Secondary | ICD-10-CM | POA: Diagnosis not present

## 2020-07-04 DIAGNOSIS — K219 Gastro-esophageal reflux disease without esophagitis: Secondary | ICD-10-CM | POA: Diagnosis not present

## 2020-07-04 DIAGNOSIS — R2689 Other abnormalities of gait and mobility: Secondary | ICD-10-CM | POA: Diagnosis not present

## 2020-07-04 DIAGNOSIS — Z20822 Contact with and (suspected) exposure to covid-19: Secondary | ICD-10-CM | POA: Diagnosis not present

## 2020-07-04 DIAGNOSIS — G309 Alzheimer's disease, unspecified: Secondary | ICD-10-CM | POA: Diagnosis not present

## 2020-07-04 DIAGNOSIS — F0280 Dementia in other diseases classified elsewhere without behavioral disturbance: Secondary | ICD-10-CM | POA: Diagnosis not present

## 2020-07-04 DIAGNOSIS — E119 Type 2 diabetes mellitus without complications: Secondary | ICD-10-CM | POA: Diagnosis not present

## 2020-07-04 DIAGNOSIS — D638 Anemia in other chronic diseases classified elsewhere: Secondary | ICD-10-CM | POA: Diagnosis not present

## 2020-07-04 DIAGNOSIS — N179 Acute kidney failure, unspecified: Secondary | ICD-10-CM | POA: Diagnosis not present

## 2020-07-04 DIAGNOSIS — G9389 Other specified disorders of brain: Secondary | ICD-10-CM | POA: Diagnosis not present

## 2020-07-04 DIAGNOSIS — Z9181 History of falling: Secondary | ICD-10-CM | POA: Diagnosis not present

## 2020-07-04 DIAGNOSIS — I119 Hypertensive heart disease without heart failure: Secondary | ICD-10-CM | POA: Diagnosis not present

## 2020-07-04 DIAGNOSIS — Z743 Need for continuous supervision: Secondary | ICD-10-CM | POA: Diagnosis not present

## 2020-07-05 DIAGNOSIS — I440 Atrioventricular block, first degree: Secondary | ICD-10-CM | POA: Diagnosis not present

## 2020-07-05 DIAGNOSIS — I4891 Unspecified atrial fibrillation: Secondary | ICD-10-CM | POA: Diagnosis not present

## 2020-07-07 DIAGNOSIS — F419 Anxiety disorder, unspecified: Secondary | ICD-10-CM | POA: Diagnosis not present

## 2020-07-07 DIAGNOSIS — I10 Essential (primary) hypertension: Secondary | ICD-10-CM | POA: Diagnosis not present

## 2020-07-07 DIAGNOSIS — R296 Repeated falls: Secondary | ICD-10-CM | POA: Diagnosis not present

## 2020-07-07 DIAGNOSIS — E669 Obesity, unspecified: Secondary | ICD-10-CM | POA: Diagnosis not present

## 2020-07-07 DIAGNOSIS — R4182 Altered mental status, unspecified: Secondary | ICD-10-CM | POA: Diagnosis not present

## 2020-07-07 DIAGNOSIS — E559 Vitamin D deficiency, unspecified: Secondary | ICD-10-CM | POA: Diagnosis not present

## 2020-07-07 DIAGNOSIS — D638 Anemia in other chronic diseases classified elsewhere: Secondary | ICD-10-CM | POA: Diagnosis not present

## 2020-07-07 DIAGNOSIS — Z743 Need for continuous supervision: Secondary | ICD-10-CM | POA: Diagnosis not present

## 2020-07-07 DIAGNOSIS — K219 Gastro-esophageal reflux disease without esophagitis: Secondary | ICD-10-CM | POA: Diagnosis not present

## 2020-07-07 DIAGNOSIS — I119 Hypertensive heart disease without heart failure: Secondary | ICD-10-CM | POA: Diagnosis not present

## 2020-07-07 DIAGNOSIS — F0390 Unspecified dementia without behavioral disturbance: Secondary | ICD-10-CM | POA: Diagnosis not present

## 2020-07-07 DIAGNOSIS — R531 Weakness: Secondary | ICD-10-CM | POA: Diagnosis not present

## 2020-07-07 DIAGNOSIS — E119 Type 2 diabetes mellitus without complications: Secondary | ICD-10-CM | POA: Diagnosis not present

## 2020-07-07 DIAGNOSIS — Z7401 Bed confinement status: Secondary | ICD-10-CM | POA: Diagnosis not present

## 2020-07-07 DIAGNOSIS — F0280 Dementia in other diseases classified elsewhere without behavioral disturbance: Secondary | ICD-10-CM | POA: Diagnosis not present

## 2020-07-07 DIAGNOSIS — G309 Alzheimer's disease, unspecified: Secondary | ICD-10-CM | POA: Diagnosis not present

## 2020-07-07 DIAGNOSIS — Z20822 Contact with and (suspected) exposure to covid-19: Secondary | ICD-10-CM | POA: Diagnosis not present

## 2020-07-07 DIAGNOSIS — L853 Xerosis cutis: Secondary | ICD-10-CM | POA: Diagnosis not present

## 2020-07-07 DIAGNOSIS — M47816 Spondylosis without myelopathy or radiculopathy, lumbar region: Secondary | ICD-10-CM | POA: Diagnosis not present

## 2020-07-07 DIAGNOSIS — N179 Acute kidney failure, unspecified: Secondary | ICD-10-CM | POA: Diagnosis not present

## 2020-07-07 DIAGNOSIS — D649 Anemia, unspecified: Secondary | ICD-10-CM | POA: Diagnosis not present

## 2020-07-08 DIAGNOSIS — L853 Xerosis cutis: Secondary | ICD-10-CM | POA: Diagnosis not present

## 2020-07-08 DIAGNOSIS — F0390 Unspecified dementia without behavioral disturbance: Secondary | ICD-10-CM | POA: Diagnosis not present

## 2020-07-08 DIAGNOSIS — D649 Anemia, unspecified: Secondary | ICD-10-CM | POA: Diagnosis not present

## 2020-07-08 DIAGNOSIS — I10 Essential (primary) hypertension: Secondary | ICD-10-CM | POA: Diagnosis not present

## 2020-07-09 DIAGNOSIS — E119 Type 2 diabetes mellitus without complications: Secondary | ICD-10-CM | POA: Diagnosis not present

## 2020-07-09 DIAGNOSIS — I10 Essential (primary) hypertension: Secondary | ICD-10-CM | POA: Diagnosis not present

## 2020-07-12 DIAGNOSIS — E119 Type 2 diabetes mellitus without complications: Secondary | ICD-10-CM | POA: Diagnosis not present

## 2020-07-12 DIAGNOSIS — F0390 Unspecified dementia without behavioral disturbance: Secondary | ICD-10-CM | POA: Diagnosis not present

## 2020-07-12 DIAGNOSIS — D649 Anemia, unspecified: Secondary | ICD-10-CM | POA: Diagnosis not present

## 2020-07-13 DIAGNOSIS — F419 Anxiety disorder, unspecified: Secondary | ICD-10-CM | POA: Diagnosis not present

## 2020-07-13 DIAGNOSIS — G309 Alzheimer's disease, unspecified: Secondary | ICD-10-CM | POA: Diagnosis not present

## 2020-07-15 DIAGNOSIS — F419 Anxiety disorder, unspecified: Secondary | ICD-10-CM | POA: Diagnosis not present

## 2020-07-15 DIAGNOSIS — F0390 Unspecified dementia without behavioral disturbance: Secondary | ICD-10-CM | POA: Diagnosis not present

## 2020-07-15 DIAGNOSIS — E559 Vitamin D deficiency, unspecified: Secondary | ICD-10-CM | POA: Diagnosis not present

## 2020-07-29 DIAGNOSIS — E1122 Type 2 diabetes mellitus with diabetic chronic kidney disease: Secondary | ICD-10-CM | POA: Diagnosis not present

## 2020-07-29 DIAGNOSIS — G301 Alzheimer's disease with late onset: Secondary | ICD-10-CM | POA: Diagnosis not present

## 2020-07-29 DIAGNOSIS — I10 Essential (primary) hypertension: Secondary | ICD-10-CM | POA: Diagnosis not present

## 2020-07-29 DIAGNOSIS — H6122 Impacted cerumen, left ear: Secondary | ICD-10-CM | POA: Diagnosis not present

## 2020-07-29 DIAGNOSIS — H9012 Conductive hearing loss, unilateral, left ear, with unrestricted hearing on the contralateral side: Secondary | ICD-10-CM | POA: Diagnosis not present

## 2020-10-08 DIAGNOSIS — I10 Essential (primary) hypertension: Secondary | ICD-10-CM | POA: Diagnosis not present

## 2020-10-08 DIAGNOSIS — R059 Cough, unspecified: Secondary | ICD-10-CM | POA: Diagnosis not present

## 2020-10-08 DIAGNOSIS — N183 Chronic kidney disease, stage 3 unspecified: Secondary | ICD-10-CM | POA: Diagnosis not present

## 2020-10-08 DIAGNOSIS — R600 Localized edema: Secondary | ICD-10-CM | POA: Diagnosis not present

## 2020-10-08 DIAGNOSIS — N1831 Chronic kidney disease, stage 3a: Secondary | ICD-10-CM | POA: Diagnosis not present

## 2020-10-08 DIAGNOSIS — I420 Dilated cardiomyopathy: Secondary | ICD-10-CM | POA: Diagnosis not present

## 2020-10-29 DIAGNOSIS — I10 Essential (primary) hypertension: Secondary | ICD-10-CM | POA: Diagnosis not present

## 2020-10-29 DIAGNOSIS — I420 Dilated cardiomyopathy: Secondary | ICD-10-CM | POA: Diagnosis not present

## 2020-10-29 DIAGNOSIS — F0390 Unspecified dementia without behavioral disturbance: Secondary | ICD-10-CM | POA: Diagnosis not present

## 2020-10-29 DIAGNOSIS — E1122 Type 2 diabetes mellitus with diabetic chronic kidney disease: Secondary | ICD-10-CM | POA: Diagnosis not present

## 2020-10-29 DIAGNOSIS — N1831 Chronic kidney disease, stage 3a: Secondary | ICD-10-CM | POA: Diagnosis not present

## 2020-10-29 DIAGNOSIS — H409 Unspecified glaucoma: Secondary | ICD-10-CM | POA: Diagnosis not present

## 2020-10-29 DIAGNOSIS — K219 Gastro-esophageal reflux disease without esophagitis: Secondary | ICD-10-CM | POA: Diagnosis not present

## 2020-11-01 DIAGNOSIS — H900 Conductive hearing loss, bilateral: Secondary | ICD-10-CM | POA: Diagnosis not present

## 2020-11-01 DIAGNOSIS — H6123 Impacted cerumen, bilateral: Secondary | ICD-10-CM | POA: Diagnosis not present

## 2020-11-15 DIAGNOSIS — H6122 Impacted cerumen, left ear: Secondary | ICD-10-CM | POA: Diagnosis not present

## 2020-12-06 DIAGNOSIS — Z03818 Encounter for observation for suspected exposure to other biological agents ruled out: Secondary | ICD-10-CM | POA: Diagnosis not present

## 2020-12-06 DIAGNOSIS — R062 Wheezing: Secondary | ICD-10-CM | POA: Diagnosis not present

## 2020-12-23 DIAGNOSIS — R051 Acute cough: Secondary | ICD-10-CM | POA: Diagnosis not present

## 2020-12-23 DIAGNOSIS — B338 Other specified viral diseases: Secondary | ICD-10-CM | POA: Diagnosis not present

## 2020-12-23 DIAGNOSIS — Z03818 Encounter for observation for suspected exposure to other biological agents ruled out: Secondary | ICD-10-CM | POA: Diagnosis not present

## 2020-12-24 DIAGNOSIS — J210 Acute bronchiolitis due to respiratory syncytial virus: Secondary | ICD-10-CM | POA: Diagnosis not present

## 2020-12-25 ENCOUNTER — Encounter (HOSPITAL_COMMUNITY): Payer: Self-pay

## 2020-12-25 ENCOUNTER — Emergency Department (HOSPITAL_COMMUNITY): Payer: Medicare PPO

## 2020-12-25 ENCOUNTER — Other Ambulatory Visit: Payer: Self-pay

## 2020-12-25 ENCOUNTER — Emergency Department (HOSPITAL_COMMUNITY)
Admission: EM | Admit: 2020-12-25 | Discharge: 2020-12-26 | Disposition: A | Discharge status: Home / Self Care | Payer: Medicare PPO | Attending: Emergency Medicine | Admitting: Emergency Medicine

## 2020-12-25 DIAGNOSIS — R112 Nausea with vomiting, unspecified: Secondary | ICD-10-CM | POA: Diagnosis not present

## 2020-12-25 DIAGNOSIS — Z20822 Contact with and (suspected) exposure to covid-19: Secondary | ICD-10-CM | POA: Insufficient documentation

## 2020-12-25 DIAGNOSIS — E871 Hypo-osmolality and hyponatremia: Secondary | ICD-10-CM | POA: Diagnosis not present

## 2020-12-25 DIAGNOSIS — R079 Chest pain, unspecified: Secondary | ICD-10-CM

## 2020-12-25 DIAGNOSIS — E119 Type 2 diabetes mellitus without complications: Secondary | ICD-10-CM | POA: Insufficient documentation

## 2020-12-25 DIAGNOSIS — I10 Essential (primary) hypertension: Secondary | ICD-10-CM | POA: Insufficient documentation

## 2020-12-25 DIAGNOSIS — B338 Other specified viral diseases: Secondary | ICD-10-CM

## 2020-12-25 DIAGNOSIS — Z955 Presence of coronary angioplasty implant and graft: Secondary | ICD-10-CM | POA: Insufficient documentation

## 2020-12-25 DIAGNOSIS — R0602 Shortness of breath: Secondary | ICD-10-CM | POA: Diagnosis not present

## 2020-12-25 DIAGNOSIS — B974 Respiratory syncytial virus as the cause of diseases classified elsewhere: Secondary | ICD-10-CM | POA: Diagnosis not present

## 2020-12-25 DIAGNOSIS — R110 Nausea: Secondary | ICD-10-CM | POA: Diagnosis not present

## 2020-12-25 DIAGNOSIS — Z7982 Long term (current) use of aspirin: Secondary | ICD-10-CM | POA: Diagnosis not present

## 2020-12-25 DIAGNOSIS — J9811 Atelectasis: Secondary | ICD-10-CM | POA: Diagnosis not present

## 2020-12-25 DIAGNOSIS — F0390 Unspecified dementia without behavioral disturbance: Secondary | ICD-10-CM | POA: Insufficient documentation

## 2020-12-25 DIAGNOSIS — D649 Anemia, unspecified: Secondary | ICD-10-CM

## 2020-12-25 DIAGNOSIS — R0789 Other chest pain: Secondary | ICD-10-CM | POA: Diagnosis not present

## 2020-12-25 DIAGNOSIS — Z7984 Long term (current) use of oral hypoglycemic drugs: Secondary | ICD-10-CM | POA: Insufficient documentation

## 2020-12-25 LAB — COMPREHENSIVE METABOLIC PANEL
ALT: 15 U/L (ref 0–44)
AST: 25 U/L (ref 15–41)
Albumin: 4.1 g/dL (ref 3.5–5.0)
Alkaline Phosphatase: 63 U/L (ref 38–126)
Anion gap: 9 (ref 5–15)
BUN: 19 mg/dL (ref 8–23)
CO2: 24 mmol/L (ref 22–32)
Calcium: 9 mg/dL (ref 8.9–10.3)
Chloride: 100 mmol/L (ref 98–111)
Creatinine, Ser: 1.52 mg/dL — ABNORMAL HIGH (ref 0.61–1.24)
GFR, Estimated: 43 mL/min — ABNORMAL LOW (ref >60)
Glucose, Bld: 194 mg/dL — ABNORMAL HIGH (ref 70–99)
Potassium: 4.1 mmol/L (ref 3.5–5.1)
Sodium: 133 mmol/L — ABNORMAL LOW (ref 135–145)
Total Bilirubin: 0.5 mg/dL (ref 0.3–1.2)
Total Protein: 7.7 g/dL (ref 6.5–8.1)

## 2020-12-25 LAB — CBC WITH DIFFERENTIAL/PLATELET
Abs Immature Granulocytes: 0.02 10*3/uL (ref 0.00–0.07)
Basophils Absolute: 0 10*3/uL (ref 0.0–0.1)
Basophils Relative: 1 %
Eosinophils Absolute: 0.3 10*3/uL (ref 0.0–0.5)
Eosinophils Relative: 4 %
HCT: 39.5 % (ref 39.0–52.0)
Hemoglobin: 12.2 g/dL — ABNORMAL LOW (ref 13.0–17.0)
Immature Granulocytes: 0 %
Lymphocytes Relative: 38 %
Lymphs Abs: 3.3 10*3/uL (ref 0.7–4.0)
MCH: 24.7 pg — ABNORMAL LOW (ref 26.0–34.0)
MCHC: 30.9 g/dL (ref 30.0–36.0)
MCV: 80 fL (ref 80.0–100.0)
Monocytes Absolute: 1.1 10*3/uL — ABNORMAL HIGH (ref 0.1–1.0)
Monocytes Relative: 12 %
Neutro Abs: 4 10*3/uL (ref 1.7–7.7)
Neutrophils Relative %: 45 %
Platelets: 167 10*3/uL (ref 150–400)
RBC: 4.94 MIL/uL (ref 4.22–5.81)
RDW: 14.3 % (ref 11.5–15.5)
WBC: 8.7 10*3/uL (ref 4.0–10.5)
nRBC: 0 % (ref 0.0–0.2)

## 2020-12-25 LAB — TROPONIN I (HIGH SENSITIVITY): Troponin I (High Sensitivity): 17 ng/L (ref <18)

## 2020-12-25 LAB — RESP PANEL BY RT-PCR (FLU A&B, COVID) ARPGX2
Influenza A by PCR: NEGATIVE
Influenza B by PCR: NEGATIVE
SARS Coronavirus 2 by RT PCR: NEGATIVE

## 2020-12-25 LAB — BRAIN NATRIURETIC PEPTIDE: B Natriuretic Peptide: 45.8 pg/mL (ref 0.0–100.0)

## 2020-12-25 MED ORDER — ONDANSETRON 8 MG PO TBDP
8.0000 mg | ORAL_TABLET | Freq: Once | ORAL | Status: AC
  Administered 2020-12-26: 8 mg via ORAL
  Filled 2020-12-25: qty 1

## 2020-12-25 MED ORDER — IPRATROPIUM-ALBUTEROL 0.5-2.5 (3) MG/3ML IN SOLN
3.0000 mL | Freq: Once | RESPIRATORY_TRACT | Status: AC
  Administered 2020-12-25: 3 mL via RESPIRATORY_TRACT
  Filled 2020-12-25: qty 3

## 2020-12-25 MED ORDER — ALBUTEROL SULFATE (2.5 MG/3ML) 0.083% IN NEBU
2.5000 mg | INHALATION_SOLUTION | Freq: Once | RESPIRATORY_TRACT | Status: AC
  Administered 2020-12-25: 2.5 mg via RESPIRATORY_TRACT
  Filled 2020-12-25: qty 3

## 2020-12-25 NOTE — ED Triage Notes (Signed)
Pt to ED from home with c/o SOB and nausea onset of 1 week ago. States he was recently diagnosed with RSV.

## 2020-12-25 NOTE — ED Provider Notes (Signed)
[facility]-EMERGENCY DEPT Provider Note   CSN: [id number] Arrival date & time: 12/25/20  2036     History Chief Complaint  Patient presents with   Nausea   Shortness of Breath    Grant Jensen. is a 87 y.o. male.  The history is provided by the spouse. The history is limited by the condition of the patient (Dementia).  Shortness of Breath He has history of hypertension, diabetes, nonischemic cardiomyopathy, dementia and is brought in because of chest pain and shortness of breath.  He started getting sick about 8 days ago with cough which is productive of some clear sputum and nausea and vomiting.  His wife thinks he may have run a low-grade fever at home but is not certain.  He has been spitting up or vomiting after he eats anything.  There has been no diarrhea.  He has been complaining of shortness of breath throughout this.  He was seen at an urgent care center 3 days ago and swab was positive for RSV.  Tonight, he was complaining of some chest pain.  He has also been complaining of pain in the left preauricular area.  Patient's spouse has been putting some eardrops in his ears.   Past Medical History:  Diagnosis Date   Diabetes mellitus without complication (HCC)    Glaucoma     Patient Active Problem List   Diagnosis Date Noted   Rhabdomyolysis 05/01/2020   Dementia (HCC) 05/01/2020   Fall at home, initial encounter 05/01/2020   Diabetes (HCC) 05/01/2020   Essential hypertension 05/01/2020   NICM (nonischemic cardiomyopathy) (HCC) 05/01/2020   Chest pain with high risk for cardiac etiology 06/20/2014    Past Surgical History:  Procedure Laterality Date   LEFT HEART CATHETERIZATION WITH CORONARY ANGIOGRAM N/A 06/22/2014   Procedure: LEFT HEART CATHETERIZATION WITH CORONARY ANGIOGRAM;  Surgeon: Yates Decamp, MD;  Location: [facility] CATH LAB;  Service: Cardiovascular;  Laterality: N/A;       No family history on file.  Social History   Tobacco Use    Smoking status: Never  Substance Use Topics   Alcohol use: Yes    Home Medications Prior to Admission medications   Medication Sig Start Date End Date Taking? Authorizing Provider  acetaminophen (TYLENOL) 325 MG tablet Take 2 tablets (650 mg total) by mouth every 6 (six) hours as needed for moderate pain. 05/04/20   Arrien, York Ram, MD  aspirin EC 81 MG tablet Take 81 mg by mouth daily.    [provider]  isosorbide mononitrate (IMDUR) 30 MG 24 hr tablet Take 30 mg by mouth daily.    [provider]  metFORMIN (GLUCOPHAGE) 500 MG tablet Take 1 tablet (500 mg total) by mouth daily with breakfast. 06/23/14   Yates Decamp, MD  omeprazole (PRILOSEC) 20 MG capsule Take 20 mg by mouth daily.    [provider]    Allergies    Lisinopril  Review of Systems   Review of Systems  Unable to perform ROS: Dementia  Respiratory:  Positive for shortness of breath.    Physical Exam Updated Vital Signs BP (!) 145/73   Pulse 70   Temp 99.8 F (37.7 C) (Oral)   Resp (!) 24   SpO2 100%   Physical Exam Vitals and nursing note reviewed.  87 year old male, resting comfortably and in no acute distress. Vital signs are significant for mildly elevated blood pressure, and elevated respiratory rate. Oxygen saturation is 100%, which is  normal. Head is normocephalic and atraumatic. PERRLA, EOMI. Oropharynx is clear.  Tympanic membranes are clear, moderate amount of cerumen present in the left external auditory canal. Neck is nontender and supple without adenopathy or JVD. Back is nontender and there is no CVA tenderness. Lungs have scattered expiratory wheezes and few bibasilar rales.  There are no rhonchi. Chest is nontender. Heart has regular rate and rhythm without murmur. Abdomen is soft, flat, nontender. Extremities have 1+ edema, full range of motion is present. Skin is warm and dry without rash. Neurologic: Awake and alert, not oriented, cranial nerves are intact,  moves all extremities equally.  ED Results / Procedures / Treatments   Labs (all labs ordered are listed, but only abnormal results are displayed) Labs Reviewed  CBC WITH DIFFERENTIAL/PLATELET - Abnormal; Notable for the following components:      Result Value   Hemoglobin 12.2 (*)    MCH 24.7 (*)    Monocytes Absolute 1.1 (*)    All other components within normal limits  RESP PANEL BY RT-PCR (FLU A&B, COVID) ARPGX2  BRAIN NATRIURETIC PEPTIDE  COMPREHENSIVE METABOLIC PANEL  TROPONIN I (HIGH SENSITIVITY)    EKG EKG Interpretation  Date/Time:  Saturday December 25 2020 21:48:52 EDT Ventricular Rate:  76 PR Interval:  188 QRS Duration: 171 QT Interval:  435 QTC Calculation: 490 R Axis:   -70 Text Interpretation: Sinus rhythm RBBB and LAFB Left ventricular hypertrophy Lateral infarct, age indeterminate When compared with ECG of 06/22/2014, No significant change was found Confirmed by Dione Booze ([id number]) on 12/25/2020 11:00:22 PM  Radiology DG Chest 2 View  Result Date: 12/25/2020 CLINICAL DATA:  Shortness of breath and nausea. EXAM: CHEST - 2 VIEW COMPARISON:  May 08, 2014 FINDINGS: Low lung volumes are noted. There is no evidence of acute infiltrate, pleural effusion or pneumothorax. Trace amount of atelectasis is seen within the bilateral lung bases. The heart size and mediastinal contours are within normal limits. Degenerative changes are seen throughout the thoracic spine and bilateral shoulders. IMPRESSION: Trace bibasilar atelectasis. Electronically Signed   By: Aram Candela M.D.   On: 12/25/2020 21:46    Procedures Procedures   Medications Ordered in ED Medications  predniSONE (DELTASONE) tablet 60 mg (has no administration in time range)  albuterol (PROVENTIL) (2.5 MG/3ML) 0.083% nebulizer solution 2.5 mg (2.5 mg Nebulization Given 12/25/20 2327)  ipratropium-albuterol (DUONEB) 0.5-2.5 (3) MG/3ML nebulizer solution 3 mL (3 mLs Nebulization Given 12/25/20 2327)   ondansetron (ZOFRAN-ODT) disintegrating tablet 8 mg (8 mg Oral Given 12/26/20 0010)    ED Course  I have reviewed the triage vital signs and the nursing notes.  Pertinent labs & imaging results that were available during my care of the patient were reviewed by me and considered in my medical decision making (see chart for details).   MDM Rules/Calculators/A&P                         Respiratory tract infection apparently secondary to RSV, worsening dyspnea today but with normal oxygen saturation and no obvious respiratory distress.  Chest x-ray shows no evidence of pneumonia.  ECG is unchanged from prior.  Labs show mild hyponatremia which is not felt to be clinically significant, and renal insufficiency which is slightly worse than baseline.  Mild anemia is present actually improved compared with baseline.  He will be given a nebulizer treatment with albuterol and ipratropium, troponin and BNP are pending.  Old records are reviewed, and he has  no relevant past visits.  Breathing is significantly improved following albuterol with ipratropium.  On reexam, lungs are clear.  Pain seems to be clearly related to his respiratory infection and I do not feel a second troponin is necessary.  He is discharged with a prescription for ondansetron for nausea, also will put on a short course of prednisone.  Return precautions discussed.  Final Clinical Impression(s) / ED Diagnoses Final diagnoses:  RSV infection  Nausea and vomiting, unspecified vomiting type  Nonspecific chest pain  Hyponatremia  Normochromic normocytic anemia    Rx / DC Orders ED Discharge Orders          Ordered    ondansetron (ZOFRAN-ODT) 8 MG disintegrating tablet  Every 8 hours PRN        12/26/20 0046    predniSONE (DELTASONE) 50 MG tablet  Daily        12/26/20 0046             Dione Booze, MD 12/26/20 0050

## 2020-12-25 NOTE — ED Provider Notes (Signed)
Emergency Medicine Provider Triage Evaluation Note  Grant Jensen. , a 87 y.o. male  was evaluated in triage.  Pt complains of shortness of breath x1 week.  Patient was diagnosed with RSV at his primary care on Thursday.  Has been having numerous episodes of vomiting today.  History of dementia, wife at bedside states that he has had a slight decline in mental status recently.  Also notes productive cough, complained to wife that his chest was hurting.  Review of Systems  Positive: Weakness, cough, nausea, vomiting, shortness of breath, chest pain Negative: Abdominal pain  Physical Exam  BP 133/73 (BP Location: Right Arm)   Pulse 79   Temp 99.8 F (37.7 C) (Oral)   Resp 17   SpO2 100%  Gen:   Awake, no distress   Resp:  Normal effort  MSK:   Moves extremities without difficulty  Other:  Expiratory wheezing in all fields  Medical Decision Making  Medically screening exam initiated at 9:29 PM.  Appropriate orders placed.  Grant Jensen. was informed that the remainder of the evaluation will be completed by another provider, this initial triage assessment does not replace that evaluation, and the importance of remaining in the ED until their evaluation is complete.     Grant Jensen 12/25/20 2130    Dione Booze, MD 12/25/20 [phone]

## 2020-12-25 NOTE — ED Notes (Signed)
Pt is c/o pain on the outer part of the left ear.

## 2020-12-26 MED ORDER — PREDNISONE 50 MG PO TABS: 50.0000 mg | ORAL_TABLET | Freq: Every day | ORAL | 0 refills | Status: DC

## 2020-12-26 MED ORDER — PREDNISONE 20 MG PO TABS
60.0000 mg | ORAL_TABLET | Freq: Once | ORAL | Status: AC
  Administered 2020-12-26: 60 mg via ORAL
  Filled 2020-12-26: qty 3

## 2020-12-26 MED ORDER — ONDANSETRON 8 MG PO TBDP: 8.0000 mg | ORAL_TABLET | Freq: Three times a day (TID) | ORAL | 0 refills | Status: DC | PRN

## 2020-12-26 NOTE — Discharge Instructions (Signed)
Use your inhaler as needed.  Return to the emergency department if symptoms are getting worse.

## 2021-01-06 DIAGNOSIS — N5089 Other specified disorders of the male genital organs: Secondary | ICD-10-CM | POA: Diagnosis not present

## 2021-01-07 DIAGNOSIS — N503 Cyst of epididymis: Secondary | ICD-10-CM | POA: Diagnosis not present

## 2021-01-07 DIAGNOSIS — N50811 Right testicular pain: Secondary | ICD-10-CM | POA: Diagnosis not present

## 2021-01-07 DIAGNOSIS — N433 Hydrocele, unspecified: Secondary | ICD-10-CM | POA: Diagnosis not present

## 2021-01-07 DIAGNOSIS — N5089 Other specified disorders of the male genital organs: Secondary | ICD-10-CM | POA: Diagnosis not present

## 2021-01-17 DIAGNOSIS — H401133 Primary open-angle glaucoma, bilateral, severe stage: Secondary | ICD-10-CM | POA: Diagnosis not present

## 2021-01-17 DIAGNOSIS — E119 Type 2 diabetes mellitus without complications: Secondary | ICD-10-CM | POA: Diagnosis not present

## 2021-01-17 DIAGNOSIS — H02054 Trichiasis without entropian left upper eyelid: Secondary | ICD-10-CM | POA: Diagnosis not present

## 2021-01-17 DIAGNOSIS — H35033 Hypertensive retinopathy, bilateral: Secondary | ICD-10-CM | POA: Diagnosis not present

## 2021-01-17 DIAGNOSIS — Z961 Presence of intraocular lens: Secondary | ICD-10-CM | POA: Diagnosis not present

## 2021-01-21 DIAGNOSIS — N50819 Testicular pain, unspecified: Secondary | ICD-10-CM | POA: Diagnosis not present

## 2021-01-21 DIAGNOSIS — R310 Gross hematuria: Secondary | ICD-10-CM | POA: Diagnosis not present

## 2021-04-13 ENCOUNTER — Other Ambulatory Visit: Payer: Self-pay

## 2021-04-13 ENCOUNTER — Emergency Department (HOSPITAL_COMMUNITY)
Admission: EM | Admit: 2021-04-13 | Discharge: 2021-04-13 | Disposition: A | Discharge status: Home / Self Care | Payer: Medicare Other | Attending: Emergency Medicine | Admitting: Emergency Medicine

## 2021-04-13 ENCOUNTER — Emergency Department (HOSPITAL_COMMUNITY): Payer: Medicare Other

## 2021-04-13 DIAGNOSIS — Z20822 Contact with and (suspected) exposure to covid-19: Secondary | ICD-10-CM | POA: Insufficient documentation

## 2021-04-13 DIAGNOSIS — Z7982 Long term (current) use of aspirin: Secondary | ICD-10-CM | POA: Insufficient documentation

## 2021-04-13 DIAGNOSIS — J9811 Atelectasis: Secondary | ICD-10-CM | POA: Diagnosis not present

## 2021-04-13 DIAGNOSIS — R1110 Vomiting, unspecified: Secondary | ICD-10-CM | POA: Insufficient documentation

## 2021-04-13 DIAGNOSIS — F0390 Unspecified dementia without behavioral disturbance: Secondary | ICD-10-CM | POA: Insufficient documentation

## 2021-04-13 DIAGNOSIS — R7989 Other specified abnormal findings of blood chemistry: Secondary | ICD-10-CM | POA: Insufficient documentation

## 2021-04-13 DIAGNOSIS — R079 Chest pain, unspecified: Secondary | ICD-10-CM | POA: Diagnosis not present

## 2021-04-13 DIAGNOSIS — Z7984 Long term (current) use of oral hypoglycemic drugs: Secondary | ICD-10-CM | POA: Insufficient documentation

## 2021-04-13 DIAGNOSIS — R0789 Other chest pain: Secondary | ICD-10-CM | POA: Diagnosis not present

## 2021-04-13 DIAGNOSIS — R0602 Shortness of breath: Secondary | ICD-10-CM | POA: Diagnosis present

## 2021-04-13 LAB — CBC
HCT: 39.3 % (ref 39.0–52.0)
Hemoglobin: 12.4 g/dL — ABNORMAL LOW (ref 13.0–17.0)
MCH: 25.3 pg — ABNORMAL LOW (ref 26.0–34.0)
MCHC: 31.6 g/dL (ref 30.0–36.0)
MCV: 80.2 fL (ref 80.0–100.0)
Platelets: 197 10*3/uL (ref 150–400)
RBC: 4.9 MIL/uL (ref 4.22–5.81)
RDW: 13.9 % (ref 11.5–15.5)
WBC: 7.3 10*3/uL (ref 4.0–10.5)
nRBC: 0 % (ref 0.0–0.2)

## 2021-04-13 LAB — BASIC METABOLIC PANEL
Anion gap: 5 (ref 5–15)
BUN: 19 mg/dL (ref 8–23)
CO2: 27 mmol/L (ref 22–32)
Calcium: 9.7 mg/dL (ref 8.9–10.3)
Chloride: 104 mmol/L (ref 98–111)
Creatinine, Ser: 1.41 mg/dL — ABNORMAL HIGH (ref 0.61–1.24)
GFR, Estimated: 47 mL/min — ABNORMAL LOW (ref >60)
Glucose, Bld: 240 mg/dL — ABNORMAL HIGH (ref 70–99)
Potassium: 4.5 mmol/L (ref 3.5–5.1)
Sodium: 136 mmol/L (ref 135–145)

## 2021-04-13 LAB — TROPONIN I (HIGH SENSITIVITY)
Troponin I (High Sensitivity): 13 ng/L (ref <18)
Troponin I (High Sensitivity): 14 ng/L (ref <18)

## 2021-04-13 LAB — RESP PANEL BY RT-PCR (FLU A&B, COVID) ARPGX2
Influenza A by PCR: NEGATIVE
Influenza B by PCR: NEGATIVE
SARS Coronavirus 2 by RT PCR: NEGATIVE

## 2021-04-13 LAB — CBG MONITORING, ED: Glucose-Capillary: 166 mg/dL — ABNORMAL HIGH (ref 70–99)

## 2021-04-13 NOTE — ED Triage Notes (Signed)
Patient was at [facility] and they called, BIB wife, reported patient was having frothy emesis, chest pain and L arm pain. Patient is demented, unable to answer questions.

## 2021-04-13 NOTE — ED Notes (Signed)
Attempted to obtain labs x1. Pt is difficult to stick and family states they always do it by ultrasound

## 2021-04-13 NOTE — ED Provider Notes (Signed)
[facility]-EMERGENCY DEPT Provider Note   CSN: [id number] Arrival date & time: 04/13/21  1316     History  Chief Complaint  Patient presents with   Chest Pain   Shortness of Breath    Grant Jensen. is a 88 y.o. male.   Chest Pain Associated symptoms: shortness of breath   Shortness of Breath Associated symptoms: chest pain    Patient presented to the ED for evaluation of chest pain.  Patient has a history of dementia so he is not able to provide me any history of what happened earlier.  The wife states the patient was at [facility].  He goes there during the day for adult daycare because he has severe dementia.  Patient apparently was having lunch he had an episode of vomiting and began complaining of chest pain and shortness of breath.  EMS was called although patient's wife arrived and she took him to the ED.  Patient right now denies having any pain or discomfort.  He does not remember the episode earlier.  Wife states he has been coughing a lot weeks recently and has been congested.  No fevers.  Patient also has been having intermittent chest pain now for several months. They were seen in the emergency room back in October of last year.  According to that ED record the patient was diagnosed with RSV.  Wife states they have followed up with the primary doctor but have not seen a cardiologist regarding his intermittent chest pain. Home Medications Prior to Admission medications   Medication Sig Start Date End Date Taking? Authorizing Provider  acetaminophen (TYLENOL) 325 MG tablet Take 2 tablets (650 mg total) by mouth every 6 (six) hours as needed for moderate pain. 05/04/20   Arrien, York Ram, MD  aspirin EC 81 MG tablet Take 81 mg by mouth daily.    [provider]  isosorbide mononitrate (IMDUR) 30 MG 24 hr tablet Take 30 mg by mouth daily.    [provider]  metFORMIN (GLUCOPHAGE) 500 MG tablet Take 1 tablet (500 mg total) by  mouth daily with breakfast. 06/23/14   Yates Decamp, MD  omeprazole (PRILOSEC) 20 MG capsule Take 20 mg by mouth daily.    [provider]  ondansetron (ZOFRAN-ODT) 8 MG disintegrating tablet Take 1 tablet (8 mg total) by mouth every 8 (eight) hours as needed for nausea or vomiting. 12/26/20   Dione Booze, MD  predniSONE (DELTASONE) 50 MG tablet Take 1 tablet (50 mg total) by mouth daily. 12/26/20   Dione Booze, MD      Allergies    Lisinopril    Review of Systems   Review of Systems  Respiratory:  Positive for shortness of breath.   Cardiovascular:  Positive for chest pain.   Physical Exam Updated Vital Signs BP 134/67 (BP Location: Left Arm)    Pulse 70    Temp 98.1 F (36.7 C) (Oral)    Resp 16    Ht 1.702 m (5\' 7" )    Wt 71 kg    SpO2 98%    BMI 24.52 kg/m  Physical Exam Vitals and nursing note reviewed.  Constitutional:      General: He is not in acute distress.    Appearance: He is well-developed.  HENT:     Head: Normocephalic and atraumatic.     Right Ear: External ear normal.     Left Ear: External ear normal.  Eyes:     General: No scleral  icterus.       Right eye: No discharge.        Left eye: No discharge.     Conjunctiva/sclera: Conjunctivae normal.  Neck:     Trachea: No tracheal deviation.  Cardiovascular:     Rate and Rhythm: Normal rate and regular rhythm.  Pulmonary:     Effort: Pulmonary effort is normal. No respiratory distress.     Breath sounds: Normal breath sounds. No stridor. No wheezing or rales.  Abdominal:     General: Bowel sounds are normal. There is no distension.     Palpations: Abdomen is soft.     Tenderness: There is no abdominal tenderness. There is no guarding or rebound.  Musculoskeletal:        General: No tenderness or deformity.     Cervical back: Neck supple.  Skin:    General: Skin is warm and dry.     Findings: No rash.  Neurological:     General: No focal deficit present.     Mental Status: He is alert.      Cranial Nerves: No cranial nerve deficit (no facial droop, extraocular movements intact, no slurred speech).     Sensory: No sensory deficit.     Motor: No abnormal muscle tone or seizure activity.     Coordination: Coordination normal.  Psychiatric:        Mood and Affect: Mood normal.    ED Results / Procedures / Treatments   Labs (all labs ordered are listed, but only abnormal results are displayed) Labs Reviewed  BASIC METABOLIC PANEL - Abnormal; Notable for the following components:      Result Value   Glucose, Bld 240 (*)    Creatinine, Ser 1.41 (*)    GFR, Estimated 47 (*)    All other components within normal limits  CBC - Abnormal; Notable for the following components:   Hemoglobin 12.4 (*)    MCH 25.3 (*)    All other components within normal limits  CBG MONITORING, ED - Abnormal; Notable for the following components:   Glucose-Capillary 166 (*)    All other components within normal limits  RESP PANEL BY RT-PCR (FLU A&B, COVID) [id number]  TROPONIN I (HIGH SENSITIVITY)  TROPONIN I (HIGH SENSITIVITY)    EKG EKG Interpretation  Date/Time:  Wednesday April 13 2021 13:26:03 EST Ventricular Rate:  85 PR Interval:  216 QRS Duration: 172 QT Interval:  411 QTC Calculation: 489 R Axis:   -74 Text Interpretation: Sinus rhythm Borderline prolonged PR interval Probable left atrial enlargement RBBB and LAFB Left ventricular hypertrophy No significant change since last tracing Confirmed by Linwood Dibbles [phone]) on 04/13/2021 3:50:04 PM  Radiology DG Chest 2 View  Result Date: 04/13/2021 CLINICAL DATA:  Chest pain and left arm pain EXAM: CHEST - 2 VIEW COMPARISON:  12/25/2020 FINDINGS: Heart size upper normal. Negative for heart failure. Mild atelectasis in the lung bases. No effusion. IMPRESSION: Mild bibasilar atelectasis. Electronically Signed   By: Marlan Palau M.D.   On: 04/13/2021 14:08    Procedures Procedures    Medications Ordered in ED Medications - No data to  display  ED Course/ Medical Decision Making/ A&P Clinical Course as of 04/13/21 1958  Wed Apr 13, 2021  1746 Flu negative. [JK]  1746 CBC(!) CBC normal [JK]  1746 Basic metabolic panel(!) Metabolic panel shows elevated creatinine at 1.41 but this is similar to previous values [JK]  1746 DG Chest 2 View Chest x-ray images and report reviewed.  No evidence of pneumonia or other acute abnormality [JK]  1934 Delta troponin is unchanged [JK]    Clinical Course User Index [JK] Linwood Dibbles, MD                           Medical Decision Making Amount and/or Complexity of Data Reviewed Labs: ordered. Decision-making details documented in ED Course. Radiology: ordered. Decision-making details documented in ED Course.   Chest pain Patient had an episode of chest pain and vomiting while he was at his adult daycare.  Patient has remained symptom-free in the ED.  He has been able to eat and drink without difficulty.  ED work-up is reassuring.  Moderate risk heart score but patient's serial troponins are normal and work-up is overall reassuring.  Discussed findings with patient's spouse.  Could consider outpatient cardiology follow-up but is age they are not particularly interested in aggressive interventions such as surgery or stenting.  Recommend outpatient follow-up with PCP.  Episode of vomiting Patient symptoms did occur while he was eating.  It is possible he might of had some type of temporary difficulty with swallowing causing him to vomit and causing his discomfort.  He is able to eat and drink without difficulty now.  He is not having abdominal pain.  Nothing to suggest esophageal impaction.  Patient appears stable for discharge.  Evaluation and diagnostic testing in the emergency department does not suggest an emergent condition requiring admission or immediate intervention beyond what has been performed at this time.  The patient is safe for discharge and has been instructed to return  immediately for worsening symptoms, change in symptoms or any other concerns.         Final Clinical Impression(s) / ED Diagnoses Final diagnoses:  Chest pain, unspecified type    Rx / DC Orders ED Discharge Orders     None         Linwood Dibbles, MD 04/13/21 1958

## 2021-04-13 NOTE — ED Notes (Signed)
Pt is sitting in his wheelchair, wife attentively at the bedside.  Pt states that he is feeling fine now.  Pt lives at home with his wife and attends a adult day program due to his dementia which is where he became ill.

## 2021-04-13 NOTE — Discharge Instructions (Addendum)
The test today were reassuring.  No signs of heart attack.  There is no sign of pneumonia.  You also tested negative for COVID and the flu.  Follow up with your doctor to be rechecked.  Return to the ED as needed for recurrent or worsening symptoms

## 2021-04-13 NOTE — ED Provider Triage Note (Signed)
Emergency Medicine Provider Triage Evaluation Note  Grant Jensen. , a 88 y.o. male  was evaluated in triage.  Pt complains of SOB for a week. CP started today / this AM. PT very hard of hearing and w dementia. Wife at bedside.  Vomited at facility before being brought to ER. Pt states no symptoms currently.  Cbg 297 at facility  Review of Systems  Positive: CP/SOB Negative: fever  Physical Exam  BP (!) 147/76 (BP Location: Left Arm)    Pulse 85    Temp 98.3 F (36.8 C) (Oral)    Resp 18    Ht 5\' 7"  (1.702 m)    Wt 71 kg    SpO2 100%    BMI 24.52 kg/m  Gen:   Awake, no distress   Resp:  Normal effort  MSK:   Moves extremities without difficulty  Other:  Extremely hard of hearing.   Medical Decision Making  Medically screening exam initiated at 1:39 PM.  Appropriate orders placed.  . was informed that the remainder of the evaluation will be completed by another provider, this initial triage assessment does not replace that evaluation, and the importance of remaining in the ED until their evaluation is complete.  CP workup initiated. He states no pain currently    Grant Jensen, Grant Jensen 04/13/21 1343

## 2021-04-28 DIAGNOSIS — K219 Gastro-esophageal reflux disease without esophagitis: Secondary | ICD-10-CM | POA: Diagnosis not present

## 2021-04-29 ENCOUNTER — Other Ambulatory Visit: Payer: Self-pay

## 2021-04-29 ENCOUNTER — Emergency Department (HOSPITAL_COMMUNITY)
Admission: EM | Admit: 2021-04-29 | Discharge: 2021-04-30 | Disposition: A | Discharge status: Home / Self Care | Payer: Medicare Other | Attending: Emergency Medicine | Admitting: Emergency Medicine

## 2021-04-29 ENCOUNTER — Emergency Department (HOSPITAL_COMMUNITY): Payer: Medicare Other

## 2021-04-29 ENCOUNTER — Encounter (HOSPITAL_COMMUNITY): Payer: Self-pay | Admitting: Emergency Medicine

## 2021-04-29 DIAGNOSIS — F0390 Unspecified dementia without behavioral disturbance: Secondary | ICD-10-CM | POA: Diagnosis not present

## 2021-04-29 DIAGNOSIS — I739 Peripheral vascular disease, unspecified: Secondary | ICD-10-CM | POA: Diagnosis not present

## 2021-04-29 DIAGNOSIS — G319 Degenerative disease of nervous system, unspecified: Secondary | ICD-10-CM | POA: Diagnosis not present

## 2021-04-29 DIAGNOSIS — Z7984 Long term (current) use of oral hypoglycemic drugs: Secondary | ICD-10-CM | POA: Diagnosis not present

## 2021-04-29 DIAGNOSIS — R531 Weakness: Secondary | ICD-10-CM | POA: Diagnosis not present

## 2021-04-29 DIAGNOSIS — Z7982 Long term (current) use of aspirin: Secondary | ICD-10-CM | POA: Insufficient documentation

## 2021-04-29 DIAGNOSIS — R739 Hyperglycemia, unspecified: Secondary | ICD-10-CM

## 2021-04-29 DIAGNOSIS — R410 Disorientation, unspecified: Secondary | ICD-10-CM | POA: Diagnosis not present

## 2021-04-29 DIAGNOSIS — E1165 Type 2 diabetes mellitus with hyperglycemia: Secondary | ICD-10-CM | POA: Diagnosis not present

## 2021-04-29 LAB — COMPREHENSIVE METABOLIC PANEL
ALT: 15 U/L (ref 0–44)
AST: 32 U/L (ref 15–41)
Albumin: 4.2 g/dL (ref 3.5–5.0)
Alkaline Phosphatase: 83 U/L (ref 38–126)
Anion gap: 6 (ref 5–15)
BUN: 18 mg/dL (ref 8–23)
CO2: 27 mmol/L (ref 22–32)
Calcium: 9.2 mg/dL (ref 8.9–10.3)
Chloride: 100 mmol/L (ref 98–111)
Creatinine, Ser: 1.51 mg/dL — ABNORMAL HIGH (ref 0.61–1.24)
GFR, Estimated: 44 mL/min — ABNORMAL LOW (ref >60)
Glucose, Bld: 250 mg/dL — ABNORMAL HIGH (ref 70–99)
Potassium: 4 mmol/L (ref 3.5–5.1)
Sodium: 133 mmol/L — ABNORMAL LOW (ref 135–145)
Total Bilirubin: 0.6 mg/dL (ref 0.3–1.2)
Total Protein: 7.5 g/dL (ref 6.5–8.1)

## 2021-04-29 LAB — CBC WITH DIFFERENTIAL/PLATELET
Abs Immature Granulocytes: 0.01 10*3/uL (ref 0.00–0.07)
Basophils Absolute: 0 10*3/uL (ref 0.0–0.1)
Basophils Relative: 1 %
Eosinophils Absolute: 0.5 10*3/uL (ref 0.0–0.5)
Eosinophils Relative: 7 %
HCT: 39.2 % (ref 39.0–52.0)
Hemoglobin: 12 g/dL — ABNORMAL LOW (ref 13.0–17.0)
Immature Granulocytes: 0 %
Lymphocytes Relative: 55 %
Lymphs Abs: 3.9 10*3/uL (ref 0.7–4.0)
MCH: 25.3 pg — ABNORMAL LOW (ref 26.0–34.0)
MCHC: 30.6 g/dL (ref 30.0–36.0)
MCV: 82.7 fL (ref 80.0–100.0)
Monocytes Absolute: 0.7 10*3/uL (ref 0.1–1.0)
Monocytes Relative: 9 %
Neutro Abs: 2 10*3/uL (ref 1.7–7.7)
Neutrophils Relative %: 28 %
Platelets: 187 10*3/uL (ref 150–400)
RBC: 4.74 MIL/uL (ref 4.22–5.81)
RDW: 13.6 % (ref 11.5–15.5)
WBC: 7.1 10*3/uL (ref 4.0–10.5)
nRBC: 0 % (ref 0.0–0.2)

## 2021-04-29 LAB — CBG MONITORING, ED: Glucose-Capillary: 268 mg/dL — ABNORMAL HIGH (ref 70–99)

## 2021-04-29 LAB — MAGNESIUM: Magnesium: 2.1 mg/dL (ref 1.7–2.4)

## 2021-04-29 MED ORDER — LACTATED RINGERS IV BOLUS
1000.0000 mL | Freq: Once | INTRAVENOUS | Status: AC
  Administered 2021-04-29: 1000 mL via INTRAVENOUS

## 2021-04-29 NOTE — ED Provider Triage Note (Signed)
Emergency Medicine Provider Triage Evaluation Note  Grant Jensen. , a 88 y.o. male  was evaluated in triage.  Pt presents with his wife for evaluation of weakness.  Patient reports since he was picked up from adult daycare he has not appeared to be himself.  States initially was because his eyes appeared quite weak and since then she describes generalized weakness.  States he has some drooling around his mouth.  He last ate around 5 PM.  Patient takes metformin and is not on insulin for his type 2 diabetes.  States she saw a reading of 425 at home and she called the nurse line and was instructed to come into the emergency room.  Denies chest pain, shortness of breath, abdominal pain, altered mental status.  Patient does have dementia at baseline.  Review of Systems  Positive: As above Negative: As above  Physical Exam  BP (!) 142/68    Pulse 76    Temp 98.7 F (37.1 C) (Oral)    Resp 16    SpO2 100%  Gen:   Awake, no distress   Resp:  Normal effort  MSK:   Moves extremities without difficulty  Other:   Medical Decision Making  Medically screening exam initiated at 9:53 PM.  Appropriate orders placed.  Grant Jensen. was informed that the remainder of the evaluation will be completed by another provider, this initial triage assessment does not replace that evaluation, and the importance of remaining in the ED until their evaluation is complete.     Marita Kansas, PA-C 04/29/21 2155

## 2021-04-29 NOTE — ED Triage Notes (Signed)
Pt reports hyperglycemia. CBG at home 425. Pt type 2 diabetic. CBG in triage 268

## 2021-04-29 NOTE — ED Provider Notes (Signed)
[facility]-EMERGENCY DEPT Provider Note   CSN: [id number] Arrival date & time: 04/29/21  2113     History  Chief Complaint  Patient presents with   Hyperglycemia    Grant Jensen. is a 88 y.o. male with history of dementia, type 2 diabetes and glaucoma who presents to the ED for evaluation of hyperglycemia and weakness.  Per patient's wife, patient was adult take care earlier today when she picked him up he seemed to be more weak and fatigued.  She specifically notes this "weakness" of the eyes, although she is unable to further clarify what this means.  She also notes some drooling outside of the mouth.  She states that the symptoms seem to have resolved aside from the continued "weakness" of the eyes.  She took a blood glucose at home which was 425.  She called the nurse line who instructed him to come to the ED.  Patient is normally ambulatory with assistance or walker.  Currently patient denies chest pain, shortness of breath, abdominal pain, nausea, vomiting, diarrhea.   Hyperglycemia Associated symptoms: weakness       Home Medications Prior to Admission medications   Medication Sig Start Date End Date Taking? Authorizing Provider  acetaminophen (TYLENOL) 325 MG tablet Take 2 tablets (650 mg total) by mouth every 6 (six) hours as needed for moderate pain. 05/04/20   Arrien, York Ram, MD  aspirin EC 81 MG tablet Take 81 mg by mouth daily.    [provider]  isosorbide mononitrate (IMDUR) 30 MG 24 hr tablet Take 30 mg by mouth daily.    [provider]  metFORMIN (GLUCOPHAGE) 500 MG tablet Take 1 tablet (500 mg total) by mouth daily with breakfast. 06/23/14   Yates Decamp, MD  omeprazole (PRILOSEC) 20 MG capsule Take 20 mg by mouth daily.    [provider]  ondansetron (ZOFRAN-ODT) 8 MG disintegrating tablet Take 1 tablet (8 mg total) by mouth every 8 (eight) hours as needed for nausea or vomiting. 12/26/20   Dione Booze, MD   predniSONE (DELTASONE) 50 MG tablet Take 1 tablet (50 mg total) by mouth daily. 12/26/20   Dione Booze, MD      Allergies    Lisinopril    Review of Systems   Review of Systems  Neurological:  Positive for weakness.   Physical Exam Updated Vital Signs BP (!) 142/54    Pulse 73    Temp 98.7 F (37.1 C) (Oral)    Resp 18    SpO2 100%  Physical Exam Vitals and nursing note reviewed.  Constitutional:      General: He is not in acute distress.    Appearance: He is not ill-appearing.  HENT:     Head: Atraumatic.  Eyes:     Conjunctiva/sclera: Conjunctivae normal.  Cardiovascular:     Rate and Rhythm: Normal rate and regular rhythm.     Pulses: Normal pulses.     Heart sounds: No murmur heard. Pulmonary:     Effort: Pulmonary effort is normal. No respiratory distress.     Breath sounds: Normal breath sounds.  Abdominal:     General: Abdomen is flat. There is no distension.     Palpations: Abdomen is soft.     Tenderness: There is no abdominal tenderness.  Musculoskeletal:        General: Normal range of motion.     Cervical back: Normal range of motion.  Skin:    General: Skin is  warm and dry.     Capillary Refill: Capillary refill takes less than 2 seconds.  Neurological:     General: No focal deficit present.     Mental Status: He is alert.     Comments: Speech is clear, able to follow commands CN III-XII intact Normal strength in upper and lower extremities bilaterally including dorsiflexion and plantar flexion, strong and equal grip strength Sensation normal to light and sharp touch Moves extremities without ataxia, coordination intact Normal finger to nose and rapid alternating movements No pronator drift    Psychiatric:        Mood and Affect: Mood normal.    ED Results / Procedures / Treatments   Labs (all labs ordered are listed, but only abnormal results are displayed) Labs Reviewed  CBC WITH DIFFERENTIAL/PLATELET - Abnormal; Notable for the following  components:      Result Value   Hemoglobin 12.0 (*)    MCH 25.3 (*)    All other components within normal limits  COMPREHENSIVE METABOLIC PANEL - Abnormal; Notable for the following components:   Sodium 133 (*)    Glucose, Bld 250 (*)    Creatinine, Ser 1.51 (*)    GFR, Estimated 44 (*)    All other components within normal limits  CBG MONITORING, ED - Abnormal; Notable for the following components:   Glucose-Capillary 268 (*)    All other components within normal limits  CBG MONITORING, ED - Abnormal; Notable for the following components:   Glucose-Capillary 175 (*)    All other components within normal limits  MAGNESIUM  URINALYSIS, ROUTINE W REFLEX MICROSCOPIC    EKG None  Radiology DG Chest 2 View  Result Date: 04/29/2021 CLINICAL DATA:  Weakness EXAM: CHEST - 2 VIEW COMPARISON:  04/13/2021 FINDINGS: Heart and mediastinal contours are within normal limits. No focal opacities or effusions. No acute bony abnormality. Degenerative changes in the thoracic spine and shoulders. IMPRESSION: No active cardiopulmonary disease. Electronically Signed   By: Charlett Nose M.D.   On: 04/29/2021 22:17   CT Head Wo Contrast  Result Date: 04/30/2021 CLINICAL DATA:  Neuro deficit, acute, stroke suspected weakness, drooling. Confusion. EXAM: CT HEAD WITHOUT CONTRAST TECHNIQUE: Contiguous axial images were obtained from the base of the skull through the vertex without intravenous contrast. RADIATION DOSE REDUCTION: This exam was performed according to the departmental dose-optimization program which includes automated exposure control, adjustment of the mA and/or kV according to patient size and/or use of iterative reconstruction technique. COMPARISON:  04/30/2020 FINDINGS: Brain: There is atrophy and chronic small vessel disease changes. No acute intracranial abnormality. Specifically, no hemorrhage, hydrocephalus, mass lesion, acute infarction, or significant intracranial injury. Vascular: No  hyperdense vessel or unexpected calcification. Skull: No acute calvarial abnormality. Sinuses/Orbits: No acute findings Other: None IMPRESSION: Atrophy, chronic microvascular disease. No acute intracranial abnormality. Electronically Signed   By: Charlett Nose M.D.   On: 04/30/2021 00:49    Procedures Procedures    Medications Ordered in ED Medications  lactated ringers bolus 1,000 mL (0 mLs Intravenous Stopped 04/30/21 0036)    ED Course/ Medical Decision Making/ A&P                           Medical Decision Making Amount and/or Complexity of Data Reviewed Radiology: ordered.   History:  Per HPI Social determinants of health: Patient has dementia, lives with his wife who is his primary caretaker  Initial impression:  This patient presents to  the ED for concern of weakness and hyperglycemia, this involves an extensive number of treatment options, and is a complaint that carries with it a high risk of complications and morbidity.   Differentials include DKA, HHS, stroke, UTI Patient's physical exam is overall reassuring.  Vitals were stable, no acute distress, nontoxic-appearing.  No obvious focal deficits although patient is having repeated lipsmacking wife states is not normal.  His glucose is already come down to 268 at time of my evaluation.  Pending remaining labs.  Will order CT head out of abundance of caution for TIA.   Lab Tests and EKG:  I Ordered, reviewed, and interpreted labs and EKG.  The pertinent results include:  CBC without leukocytosis CMP with initial glucose of 250 Magnesium 2.1   Imaging Studies ordered:  I ordered imaging studies including  Chest x-ray without acute findings CT head without acute intracranial findings I independently visualized and interpreted imaging and I agree with the radiologist interpretation.    Cardiac Monitoring:  The patient was maintained on a cardiac monitor.  I personally viewed and interpreted the cardiac monitored  which showed an underlying rhythm of: NSR   Medicines ordered and prescription drug management:  I ordered medication including: 1 L LR bolus for hyperglycemia Reevaluation of the patient after these medicines showed that the patient resolved I have reviewed the patients home medicines and have made adjustments as needed   ED Course: Patient's work-up was overall reassuring.  We did want to obtain a urine sample however were unsuccessful with 2 attempts with in and out catheter.  Patient's wife frequently does in and out catheters at home and says that this is a frequent problem.  Discussed this case with Dr. Madilyn Hook who evaluated patient herself and agrees that patient looks fine to discharge without urinalysis.  We will send home with a urinal to have wife collect sample for PCP to test.  Disposition:  After consideration of the diagnostic results, physical exam, history and the patients response to treatment feel that the patent would benefit from discharge with strict return precautions and outpatient follow-up.   Hyperglycemia Weakness: Final CBG of 175.  No evidence of DKA.  No evidence of HHS.  No evidence of TIA.  No evidence of acute infection.  Pending urinalysis for wife to collect sample at home.  Results of this would not change disposition which is why we we feel comfortable sending him home with outpatient follow-up.  All questions were asked and answered.  Patient was discharged home in good condition.   Final Clinical Impression(s) / ED Diagnoses Final diagnoses:  Hyperglycemia  Weakness    Rx / DC Orders ED Discharge Orders     None         Delight Ovens 04/30/21 0336    Tilden Fossa, MD 05/02/21 [phone]

## 2021-04-30 ENCOUNTER — Emergency Department (HOSPITAL_COMMUNITY): Payer: Medicare Other

## 2021-04-30 DIAGNOSIS — R410 Disorientation, unspecified: Secondary | ICD-10-CM | POA: Diagnosis not present

## 2021-04-30 DIAGNOSIS — G319 Degenerative disease of nervous system, unspecified: Secondary | ICD-10-CM | POA: Diagnosis not present

## 2021-04-30 DIAGNOSIS — I739 Peripheral vascular disease, unspecified: Secondary | ICD-10-CM | POA: Diagnosis not present

## 2021-04-30 LAB — URINALYSIS, ROUTINE W REFLEX MICROSCOPIC
Bilirubin Urine: NEGATIVE
Glucose, UA: NEGATIVE mg/dL
Hgb urine dipstick: NEGATIVE
Ketones, ur: NEGATIVE mg/dL
Leukocytes,Ua: NEGATIVE
Nitrite: NEGATIVE
Protein, ur: NEGATIVE mg/dL
Specific Gravity, Urine: 1.01 (ref 1.005–1.030)
pH: 7 (ref 5.0–8.0)

## 2021-04-30 LAB — CBG MONITORING, ED: Glucose-Capillary: 175 mg/dL — ABNORMAL HIGH (ref 70–99)

## 2021-04-30 NOTE — Discharge Instructions (Signed)
Grant Jensen work-up was reassuring today.  He did have a slightly elevated blood glucose when he got here, however this has resolved.  The rest of his labs are reassuring.  His CT head was normal without any acute cause of his weakness or symptoms.  We would like to obtain a urine sample to ensure that he does not have a urine infection.  We will send you home with a urinal and if you are able to collect a sample, please drop this off with your PCP for urinalysis.  If Grant Jensen develops worsening weakness please return to the ED for reevaluation.

## 2021-04-30 NOTE — ED Notes (Signed)
In and out Cath attempted x 2, by this RN and additional ED RN. Unsuccessful, blockage met each time.

## 2021-05-01 LAB — URINE CULTURE: Culture: NO GROWTH

## 2021-05-19 DIAGNOSIS — E1122 Type 2 diabetes mellitus with diabetic chronic kidney disease: Secondary | ICD-10-CM | POA: Diagnosis not present

## 2021-05-19 DIAGNOSIS — E1165 Type 2 diabetes mellitus with hyperglycemia: Secondary | ICD-10-CM | POA: Diagnosis not present

## 2021-05-19 DIAGNOSIS — R8290 Unspecified abnormal findings in urine: Secondary | ICD-10-CM | POA: Diagnosis not present

## 2021-05-19 DIAGNOSIS — R5383 Other fatigue: Secondary | ICD-10-CM | POA: Diagnosis not present

## 2021-05-19 DIAGNOSIS — I10 Essential (primary) hypertension: Secondary | ICD-10-CM | POA: Diagnosis not present

## 2021-08-19 DIAGNOSIS — H189 Unspecified disorder of cornea: Secondary | ICD-10-CM | POA: Diagnosis not present

## 2021-08-19 DIAGNOSIS — H401133 Primary open-angle glaucoma, bilateral, severe stage: Secondary | ICD-10-CM | POA: Diagnosis not present

## 2021-08-25 ENCOUNTER — Other Ambulatory Visit: Payer: Self-pay

## 2021-08-25 ENCOUNTER — Emergency Department (HOSPITAL_COMMUNITY): Payer: Medicare Other

## 2021-08-25 ENCOUNTER — Emergency Department (HOSPITAL_COMMUNITY)
Admission: EM | Admit: 2021-08-25 | Discharge: 2021-08-25 | Disposition: A | Discharge status: Home / Self Care | Payer: Medicare Other | Attending: Emergency Medicine | Admitting: Emergency Medicine

## 2021-08-25 ENCOUNTER — Encounter (HOSPITAL_COMMUNITY): Payer: Self-pay

## 2021-08-25 DIAGNOSIS — E860 Dehydration: Secondary | ICD-10-CM | POA: Diagnosis not present

## 2021-08-25 DIAGNOSIS — I959 Hypotension, unspecified: Secondary | ICD-10-CM | POA: Diagnosis not present

## 2021-08-25 DIAGNOSIS — R1084 Generalized abdominal pain: Secondary | ICD-10-CM | POA: Diagnosis not present

## 2021-08-25 DIAGNOSIS — I454 Nonspecific intraventricular block: Secondary | ICD-10-CM | POA: Diagnosis not present

## 2021-08-25 DIAGNOSIS — R531 Weakness: Secondary | ICD-10-CM | POA: Diagnosis not present

## 2021-08-25 DIAGNOSIS — R0789 Other chest pain: Secondary | ICD-10-CM | POA: Diagnosis not present

## 2021-08-25 DIAGNOSIS — W19XXXA Unspecified fall, initial encounter: Secondary | ICD-10-CM | POA: Diagnosis not present

## 2021-08-25 DIAGNOSIS — E119 Type 2 diabetes mellitus without complications: Secondary | ICD-10-CM | POA: Diagnosis not present

## 2021-08-25 DIAGNOSIS — Z7984 Long term (current) use of oral hypoglycemic drugs: Secondary | ICD-10-CM | POA: Diagnosis not present

## 2021-08-25 DIAGNOSIS — I4510 Unspecified right bundle-branch block: Secondary | ICD-10-CM | POA: Diagnosis not present

## 2021-08-25 DIAGNOSIS — Z7982 Long term (current) use of aspirin: Secondary | ICD-10-CM | POA: Insufficient documentation

## 2021-08-25 DIAGNOSIS — R410 Disorientation, unspecified: Secondary | ICD-10-CM | POA: Diagnosis not present

## 2021-08-25 LAB — CBC WITH DIFFERENTIAL/PLATELET
Abs Immature Granulocytes: 0.02 10*3/uL (ref 0.00–0.07)
Basophils Absolute: 0 10*3/uL (ref 0.0–0.1)
Basophils Relative: 0 %
Eosinophils Absolute: 0.4 10*3/uL (ref 0.0–0.5)
Eosinophils Relative: 4 %
HCT: 37.7 % — ABNORMAL LOW (ref 39.0–52.0)
Hemoglobin: 11.7 g/dL — ABNORMAL LOW (ref 13.0–17.0)
Immature Granulocytes: 0 %
Lymphocytes Relative: 35 %
Lymphs Abs: 3 10*3/uL (ref 0.7–4.0)
MCH: 24.8 pg — ABNORMAL LOW (ref 26.0–34.0)
MCHC: 31 g/dL (ref 30.0–36.0)
MCV: 80 fL (ref 80.0–100.0)
Monocytes Absolute: 0.6 10*3/uL (ref 0.1–1.0)
Monocytes Relative: 7 %
Neutro Abs: 4.6 10*3/uL (ref 1.7–7.7)
Neutrophils Relative %: 54 %
Platelets: 195 10*3/uL (ref 150–400)
RBC: 4.71 MIL/uL (ref 4.22–5.81)
RDW: 14.4 % (ref 11.5–15.5)
WBC: 8.7 10*3/uL (ref 4.0–10.5)
nRBC: 0 % (ref 0.0–0.2)

## 2021-08-25 LAB — BASIC METABOLIC PANEL
Anion gap: 9 (ref 5–15)
BUN: 18 mg/dL (ref 8–23)
CO2: 21 mmol/L — ABNORMAL LOW (ref 22–32)
Calcium: 9.1 mg/dL (ref 8.9–10.3)
Chloride: 108 mmol/L (ref 98–111)
Creatinine, Ser: 1.47 mg/dL — ABNORMAL HIGH (ref 0.61–1.24)
GFR, Estimated: 45 mL/min — ABNORMAL LOW (ref >60)
Glucose, Bld: 130 mg/dL — ABNORMAL HIGH (ref 70–99)
Potassium: 3.8 mmol/L (ref 3.5–5.1)
Sodium: 138 mmol/L (ref 135–145)

## 2021-08-25 MED ORDER — SODIUM CHLORIDE 0.9 % IV BOLUS
1000.0000 mL | Freq: Once | INTRAVENOUS | Status: AC
  Administered 2021-08-25: 1000 mL via INTRAVENOUS

## 2021-08-25 MED ORDER — SODIUM CHLORIDE 0.9 % IV BOLUS
1000.0000 mL | Freq: Once | INTRAVENOUS | Status: DC
Start: 2021-08-25 — End: 2021-08-26

## 2021-08-25 MED ORDER — HYDROXYZINE HCL 25 MG PO TABS: ORAL_TABLET | ORAL | 0 refills | Status: DC

## 2021-08-25 MED ORDER — HYDROXYZINE HCL 25 MG PO TABS
25.0000 mg | ORAL_TABLET | Freq: Once | ORAL | Status: AC
  Administered 2021-08-25: 25 mg via ORAL
  Filled 2021-08-25: qty 1

## 2021-08-25 NOTE — ED Provider Notes (Incomplete)
[facility]-EMERGENCY DEPT Provider Note   CSN: [id number] Arrival date & time: 08/25/21  1848     History {Add pertinent medical, surgical, social history, OB history to HPI:1} Chief Complaint  Patient presents with   Fall   Weakness    Grant Crutch. is a 88 y.o. male.  Patient has a history of diabetes.  According to his wife he has been weak for 2 days and she saw him go to the floor slowly but not had any   Fall  Weakness      Home Medications Prior to Admission medications   Medication Sig Start Date End Date Taking? Authorizing Provider  hydrOXYzine (ATARAX) 25 MG tablet Take 1 at night as needed for sleep 08/25/21  Yes Bethann Berkshire, MD  acetaminophen (TYLENOL) 325 MG tablet Take 2 tablets (650 mg total) by mouth every 6 (six) hours as needed for moderate pain. 05/04/20   Arrien, York Ram, MD  aspirin EC 81 MG tablet Take 81 mg by mouth daily.    [provider]  isosorbide mononitrate (IMDUR) 30 MG 24 hr tablet Take 30 mg by mouth daily.    [provider]  metFORMIN (GLUCOPHAGE) 500 MG tablet Take 1 tablet (500 mg total) by mouth daily with breakfast. 06/23/14   Yates Decamp, MD  omeprazole (PRILOSEC) 20 MG capsule Take 20 mg by mouth daily.    [provider]  ondansetron (ZOFRAN-ODT) 8 MG disintegrating tablet Take 1 tablet (8 mg total) by mouth every 8 (eight) hours as needed for nausea or vomiting. 12/26/20   Dione Booze, MD  predniSONE (DELTASONE) 50 MG tablet Take 1 tablet (50 mg total) by mouth daily. 12/26/20   Dione Booze, MD      Allergies    Lisinopril    Review of Systems   Review of Systems  Neurological:  Positive for weakness.    Physical Exam Updated Vital Signs BP 107/65   Pulse 66   Temp 97.9 F (36.6 C) (Oral)   Resp 17   SpO2 100%  Physical Exam  ED Results / Procedures / Treatments   Labs (all labs ordered are listed, but only abnormal results are displayed) Labs  Reviewed  CBC WITH DIFFERENTIAL/PLATELET - Abnormal; Notable for the following components:      Result Value   Hemoglobin 11.7 (*)    HCT 37.7 (*)    MCH 24.8 (*)    All other components within normal limits  BASIC METABOLIC PANEL - Abnormal; Notable for the following components:   CO2 21 (*)    Glucose, Bld 130 (*)    Creatinine, Ser 1.47 (*)    GFR, Estimated 45 (*)    All other components within normal limits    EKG None  Radiology DG Chest Port 1 View  Result Date: 08/25/2021 CLINICAL DATA:  Weakness EXAM: PORTABLE CHEST 1 VIEW COMPARISON:  04/29/2021 FINDINGS: The heart size and mediastinal contours are within normal limits. Both lungs are clear. The visualized skeletal structures are unremarkable. IMPRESSION: No active disease. Electronically Signed   By: Jasmine Pang M.D.   On: 08/25/2021 19:50    Procedures Procedures  {Document cardiac monitor, telemetry assessment procedure when appropriate:1}  Medications Ordered in ED Medications  sodium chloride 0.9 % bolus 1,000 mL (1,000 mLs Intravenous Not Given 08/25/21 2222)  hydrOXYzine (ATARAX) tablet 25 mg (has no administration in time range)  sodium chloride 0.9 % bolus 1,000 mL (0 mLs Intravenous Stopped 08/25/21 2139)  sodium chloride 0.9 % bolus 1,000 mL (1,000 mLs Intravenous New Bag/Given 08/25/21 2159)    ED Course/ Medical Decision Making/ A&P                           Medical Decision Making Amount and/or Complexity of Data Reviewed Labs: ordered. Radiology: ordered. ECG/medicine tests: ordered.  Risk Prescription drug management.   Patient with weakness and dehydration.  Patient improved with fluids and will follow-up with PCP  {Document critical care time when appropriate:1} {Document review of labs and clinical decision tools ie heart score, Chads2Vasc2 etc:1}  {Document your independent review of radiology images, and any outside records:1} {Document your discussion with family members, caretakers,  and with consultants:1} {Document social determinants of health affecting pt's care:1} {Document your decision making why or why not admission, treatments were needed:1} Final Clinical Impression(s) / ED Diagnoses Final diagnoses:  Weakness  Dehydration    Rx / DC Orders ED Discharge Orders          Ordered    hydrOXYzine (ATARAX) 25 MG tablet        08/25/21 2258

## 2021-08-25 NOTE — Discharge Instructions (Signed)
Drink plenty of fluids and follow-up with your doctor next week for recheck °

## 2021-08-25 NOTE — ED Triage Notes (Signed)
EMS reports from home. Wife states 2 falls today and weakness  x 2 days. Hx of Alzheimer's. Pt at baseline per Wife.  BP 95/58 HR 70 RR 16 Sp02 97 RA CBG 185  20ga L forearm  NS enroute

## 2021-08-31 DIAGNOSIS — I10 Essential (primary) hypertension: Secondary | ICD-10-CM | POA: Diagnosis not present

## 2021-08-31 DIAGNOSIS — K219 Gastro-esophageal reflux disease without esophagitis: Secondary | ICD-10-CM | POA: Diagnosis not present

## 2021-08-31 DIAGNOSIS — E1122 Type 2 diabetes mellitus with diabetic chronic kidney disease: Secondary | ICD-10-CM | POA: Diagnosis not present

## 2021-08-31 DIAGNOSIS — N1831 Chronic kidney disease, stage 3a: Secondary | ICD-10-CM | POA: Diagnosis not present

## 2021-08-31 DIAGNOSIS — E1165 Type 2 diabetes mellitus with hyperglycemia: Secondary | ICD-10-CM | POA: Diagnosis not present

## 2021-08-31 DIAGNOSIS — G301 Alzheimer's disease with late onset: Secondary | ICD-10-CM | POA: Diagnosis not present

## 2021-09-05 DIAGNOSIS — I420 Dilated cardiomyopathy: Secondary | ICD-10-CM | POA: Diagnosis not present

## 2021-09-05 DIAGNOSIS — E1122 Type 2 diabetes mellitus with diabetic chronic kidney disease: Secondary | ICD-10-CM | POA: Diagnosis not present

## 2021-09-05 DIAGNOSIS — K219 Gastro-esophageal reflux disease without esophagitis: Secondary | ICD-10-CM | POA: Diagnosis not present

## 2021-09-05 DIAGNOSIS — E611 Iron deficiency: Secondary | ICD-10-CM | POA: Diagnosis not present

## 2021-09-05 DIAGNOSIS — Z0000 Encounter for general adult medical examination without abnormal findings: Secondary | ICD-10-CM | POA: Diagnosis not present

## 2021-09-05 DIAGNOSIS — N1831 Chronic kidney disease, stage 3a: Secondary | ICD-10-CM | POA: Diagnosis not present

## 2021-09-05 DIAGNOSIS — I10 Essential (primary) hypertension: Secondary | ICD-10-CM | POA: Diagnosis not present

## 2021-09-05 DIAGNOSIS — G301 Alzheimer's disease with late onset: Secondary | ICD-10-CM | POA: Diagnosis not present

## 2021-10-19 DIAGNOSIS — E611 Iron deficiency: Secondary | ICD-10-CM | POA: Diagnosis not present

## 2021-10-26 DIAGNOSIS — R051 Acute cough: Secondary | ICD-10-CM | POA: Diagnosis not present

## 2021-10-26 DIAGNOSIS — Z03818 Encounter for observation for suspected exposure to other biological agents ruled out: Secondary | ICD-10-CM | POA: Diagnosis not present

## 2021-12-05 DIAGNOSIS — R051 Acute cough: Secondary | ICD-10-CM | POA: Diagnosis not present

## 2021-12-05 DIAGNOSIS — R059 Cough, unspecified: Secondary | ICD-10-CM | POA: Diagnosis not present

## 2021-12-05 DIAGNOSIS — Z03818 Encounter for observation for suspected exposure to other biological agents ruled out: Secondary | ICD-10-CM | POA: Diagnosis not present

## 2021-12-06 ENCOUNTER — Other Ambulatory Visit: Payer: Self-pay | Admitting: Internal Medicine

## 2021-12-06 ENCOUNTER — Ambulatory Visit
Admission: RE | Admit: 2021-12-06 | Discharge: 2021-12-06 | Disposition: A | Discharge status: Home / Self Care | Payer: Medicare Other | Source: Ambulatory Visit | Attending: Internal Medicine | Admitting: Internal Medicine

## 2021-12-06 DIAGNOSIS — R051 Acute cough: Secondary | ICD-10-CM

## 2021-12-06 DIAGNOSIS — R0602 Shortness of breath: Secondary | ICD-10-CM | POA: Diagnosis not present

## 2021-12-06 DIAGNOSIS — R059 Cough, unspecified: Secondary | ICD-10-CM | POA: Diagnosis not present

## 2021-12-09 DIAGNOSIS — R051 Acute cough: Secondary | ICD-10-CM | POA: Diagnosis not present

## 2021-12-12 DIAGNOSIS — J470 Bronchiectasis with acute lower respiratory infection: Secondary | ICD-10-CM | POA: Diagnosis not present

## 2021-12-12 DIAGNOSIS — R051 Acute cough: Secondary | ICD-10-CM | POA: Diagnosis not present

## 2022-01-12 DIAGNOSIS — J470 Bronchiectasis with acute lower respiratory infection: Secondary | ICD-10-CM | POA: Diagnosis not present

## 2022-01-12 DIAGNOSIS — R051 Acute cough: Secondary | ICD-10-CM | POA: Diagnosis not present

## 2022-02-11 DIAGNOSIS — R051 Acute cough: Secondary | ICD-10-CM | POA: Diagnosis not present

## 2022-02-11 DIAGNOSIS — J470 Bronchiectasis with acute lower respiratory infection: Secondary | ICD-10-CM | POA: Diagnosis not present

## 2022-04-09 ENCOUNTER — Inpatient Hospital Stay (HOSPITAL_COMMUNITY): Payer: Medicare Other

## 2022-04-09 ENCOUNTER — Emergency Department (HOSPITAL_COMMUNITY): Payer: Medicare Other

## 2022-04-09 ENCOUNTER — Inpatient Hospital Stay (HOSPITAL_COMMUNITY)
Admission: EM | Admit: 2022-04-09 | Discharge: 2022-04-14 | DRG: 065 | Disposition: A | Discharge status: Rehabilitation Facility | Payer: Medicare Other | Attending: Internal Medicine | Admitting: Internal Medicine

## 2022-04-09 ENCOUNTER — Other Ambulatory Visit (HOSPITAL_COMMUNITY): Payer: Medicare Other

## 2022-04-09 DIAGNOSIS — I129 Hypertensive chronic kidney disease with stage 1 through stage 4 chronic kidney disease, or unspecified chronic kidney disease: Secondary | ICD-10-CM | POA: Diagnosis present

## 2022-04-09 DIAGNOSIS — I6389 Other cerebral infarction: Secondary | ICD-10-CM | POA: Diagnosis not present

## 2022-04-09 DIAGNOSIS — Z1152 Encounter for screening for COVID-19: Secondary | ICD-10-CM

## 2022-04-09 DIAGNOSIS — E785 Hyperlipidemia, unspecified: Secondary | ICD-10-CM | POA: Diagnosis not present

## 2022-04-09 DIAGNOSIS — Z7982 Long term (current) use of aspirin: Secondary | ICD-10-CM | POA: Diagnosis not present

## 2022-04-09 DIAGNOSIS — G8194 Hemiplegia, unspecified affecting left nondominant side: Secondary | ICD-10-CM | POA: Diagnosis not present

## 2022-04-09 DIAGNOSIS — E1122 Type 2 diabetes mellitus with diabetic chronic kidney disease: Secondary | ICD-10-CM | POA: Diagnosis not present

## 2022-04-09 DIAGNOSIS — F0390 Unspecified dementia without behavioral disturbance: Secondary | ICD-10-CM | POA: Diagnosis present

## 2022-04-09 DIAGNOSIS — R471 Dysarthria and anarthria: Secondary | ICD-10-CM | POA: Diagnosis not present

## 2022-04-09 DIAGNOSIS — I428 Other cardiomyopathies: Secondary | ICD-10-CM | POA: Diagnosis present

## 2022-04-09 DIAGNOSIS — E119 Type 2 diabetes mellitus without complications: Secondary | ICD-10-CM

## 2022-04-09 DIAGNOSIS — K5900 Constipation, unspecified: Secondary | ICD-10-CM | POA: Diagnosis present

## 2022-04-09 DIAGNOSIS — I6381 Other cerebral infarction due to occlusion or stenosis of small artery: Secondary | ICD-10-CM | POA: Diagnosis not present

## 2022-04-09 DIAGNOSIS — R29708 NIHSS score 8: Secondary | ICD-10-CM | POA: Diagnosis not present

## 2022-04-09 DIAGNOSIS — I6601 Occlusion and stenosis of right middle cerebral artery: Secondary | ICD-10-CM | POA: Diagnosis not present

## 2022-04-09 DIAGNOSIS — Z66 Do not resuscitate: Secondary | ICD-10-CM | POA: Diagnosis not present

## 2022-04-09 DIAGNOSIS — E1165 Type 2 diabetes mellitus with hyperglycemia: Secondary | ICD-10-CM | POA: Diagnosis present

## 2022-04-09 DIAGNOSIS — E669 Obesity, unspecified: Secondary | ICD-10-CM | POA: Diagnosis present

## 2022-04-09 DIAGNOSIS — E1169 Type 2 diabetes mellitus with other specified complication: Secondary | ICD-10-CM | POA: Diagnosis not present

## 2022-04-09 DIAGNOSIS — R29898 Other symptoms and signs involving the musculoskeletal system: Secondary | ICD-10-CM | POA: Diagnosis not present

## 2022-04-09 DIAGNOSIS — I69354 Hemiplegia and hemiparesis following cerebral infarction affecting left non-dominant side: Secondary | ICD-10-CM | POA: Diagnosis not present

## 2022-04-09 DIAGNOSIS — R3915 Urgency of urination: Secondary | ICD-10-CM | POA: Diagnosis not present

## 2022-04-09 DIAGNOSIS — E11319 Type 2 diabetes mellitus with unspecified diabetic retinopathy without macular edema: Secondary | ICD-10-CM | POA: Diagnosis present

## 2022-04-09 DIAGNOSIS — I639 Cerebral infarction, unspecified: Secondary | ICD-10-CM | POA: Diagnosis not present

## 2022-04-09 DIAGNOSIS — I10 Essential (primary) hypertension: Secondary | ICD-10-CM | POA: Diagnosis not present

## 2022-04-09 DIAGNOSIS — H409 Unspecified glaucoma: Secondary | ICD-10-CM | POA: Diagnosis present

## 2022-04-09 DIAGNOSIS — R29810 Facial weakness: Secondary | ICD-10-CM | POA: Diagnosis present

## 2022-04-09 DIAGNOSIS — Z79899 Other long term (current) drug therapy: Secondary | ICD-10-CM | POA: Diagnosis not present

## 2022-04-09 DIAGNOSIS — F01B0 Vascular dementia, moderate, without behavioral disturbance, psychotic disturbance, mood disturbance, and anxiety: Secondary | ICD-10-CM | POA: Diagnosis not present

## 2022-04-09 DIAGNOSIS — Z6832 Body mass index (BMI) 32.0-32.9, adult: Secondary | ICD-10-CM | POA: Diagnosis not present

## 2022-04-09 DIAGNOSIS — R079 Chest pain, unspecified: Secondary | ICD-10-CM | POA: Diagnosis not present

## 2022-04-09 DIAGNOSIS — Z7984 Long term (current) use of oral hypoglycemic drugs: Secondary | ICD-10-CM

## 2022-04-09 DIAGNOSIS — N183 Chronic kidney disease, stage 3 unspecified: Secondary | ICD-10-CM | POA: Diagnosis not present

## 2022-04-09 DIAGNOSIS — Z8673 Personal history of transient ischemic attack (TIA), and cerebral infarction without residual deficits: Secondary | ICD-10-CM | POA: Diagnosis present

## 2022-04-09 DIAGNOSIS — R531 Weakness: Secondary | ICD-10-CM | POA: Diagnosis not present

## 2022-04-09 DIAGNOSIS — N1832 Chronic kidney disease, stage 3b: Secondary | ICD-10-CM | POA: Diagnosis not present

## 2022-04-09 DIAGNOSIS — Z7952 Long term (current) use of systemic steroids: Secondary | ICD-10-CM | POA: Diagnosis not present

## 2022-04-09 DIAGNOSIS — R4182 Altered mental status, unspecified: Secondary | ICD-10-CM | POA: Diagnosis not present

## 2022-04-09 DIAGNOSIS — I6621 Occlusion and stenosis of right posterior cerebral artery: Secondary | ICD-10-CM | POA: Diagnosis not present

## 2022-04-09 DIAGNOSIS — R4781 Slurred speech: Secondary | ICD-10-CM | POA: Diagnosis not present

## 2022-04-09 HISTORY — DX: Essential (primary) hypertension: I10

## 2022-04-09 HISTORY — DX: Unspecified visual loss: H54.7

## 2022-04-09 HISTORY — DX: Unspecified dementia, unspecified severity, without behavioral disturbance, psychotic disturbance, mood disturbance, and anxiety: F03.90

## 2022-04-09 LAB — CBC
HCT: 37 % — ABNORMAL LOW (ref 39.0–52.0)
Hemoglobin: 11.3 g/dL — ABNORMAL LOW (ref 13.0–17.0)
MCH: 24.4 pg — ABNORMAL LOW (ref 26.0–34.0)
MCHC: 30.5 g/dL (ref 30.0–36.0)
MCV: 79.7 fL — ABNORMAL LOW (ref 80.0–100.0)
Platelets: 171 10*3/uL (ref 150–400)
RBC: 4.64 MIL/uL (ref 4.22–5.81)
RDW: 14.1 % (ref 11.5–15.5)
WBC: 7.5 10*3/uL (ref 4.0–10.5)
nRBC: 0 % (ref 0.0–0.2)

## 2022-04-09 LAB — COMPREHENSIVE METABOLIC PANEL
ALT: 16 U/L (ref 0–44)
AST: 32 U/L (ref 15–41)
Albumin: 3.9 g/dL (ref 3.5–5.0)
Alkaline Phosphatase: 57 U/L (ref 38–126)
Anion gap: 11 (ref 5–15)
BUN: 15 mg/dL (ref 8–23)
CO2: 20 mmol/L — ABNORMAL LOW (ref 22–32)
Calcium: 9 mg/dL (ref 8.9–10.3)
Chloride: 104 mmol/L (ref 98–111)
Creatinine, Ser: 1.64 mg/dL — ABNORMAL HIGH (ref 0.61–1.24)
GFR, Estimated: 39 mL/min — ABNORMAL LOW (ref >60)
Glucose, Bld: 162 mg/dL — ABNORMAL HIGH (ref 70–99)
Potassium: 4 mmol/L (ref 3.5–5.1)
Sodium: 135 mmol/L (ref 135–145)
Total Bilirubin: 0.3 mg/dL (ref 0.3–1.2)
Total Protein: 6.6 g/dL (ref 6.5–8.1)

## 2022-04-09 LAB — PROTIME-INR
INR: 1 (ref 0.8–1.2)
Prothrombin Time: 13.4 seconds (ref 11.4–15.2)

## 2022-04-09 LAB — I-STAT CHEM 8, ED
BUN: 18 mg/dL (ref 8–23)
Calcium, Ion: 1.09 mmol/L — ABNORMAL LOW (ref 1.15–1.40)
Chloride: 104 mmol/L (ref 98–111)
Creatinine, Ser: 1.7 mg/dL — ABNORMAL HIGH (ref 0.61–1.24)
Glucose, Bld: 160 mg/dL — ABNORMAL HIGH (ref 70–99)
HCT: 39 % (ref 39.0–52.0)
Hemoglobin: 13.3 g/dL (ref 13.0–17.0)
Potassium: 4 mmol/L (ref 3.5–5.1)
Sodium: 137 mmol/L (ref 135–145)
TCO2: 22 mmol/L (ref 22–32)

## 2022-04-09 LAB — DIFFERENTIAL
Abs Immature Granulocytes: 0.01 10*3/uL (ref 0.00–0.07)
Basophils Absolute: 0 10*3/uL (ref 0.0–0.1)
Basophils Relative: 1 %
Eosinophils Absolute: 0.5 10*3/uL (ref 0.0–0.5)
Eosinophils Relative: 6 %
Immature Granulocytes: 0 %
Lymphocytes Relative: 52 %
Lymphs Abs: 3.9 10*3/uL (ref 0.7–4.0)
Monocytes Absolute: 0.7 10*3/uL (ref 0.1–1.0)
Monocytes Relative: 9 %
Neutro Abs: 2.4 10*3/uL (ref 1.7–7.7)
Neutrophils Relative %: 32 %

## 2022-04-09 LAB — GLUCOSE, CAPILLARY
Glucose-Capillary: 124 mg/dL — ABNORMAL HIGH (ref 70–99)
Glucose-Capillary: 140 mg/dL — ABNORMAL HIGH (ref 70–99)
Glucose-Capillary: 77 mg/dL (ref 70–99)

## 2022-04-09 LAB — RESP PANEL BY RT-PCR (RSV, FLU A&B, COVID)  RVPGX2
Influenza A by PCR: NEGATIVE
Influenza B by PCR: NEGATIVE
Resp Syncytial Virus by PCR: NEGATIVE
SARS Coronavirus 2 by RT PCR: NEGATIVE

## 2022-04-09 LAB — CBG MONITORING, ED: Glucose-Capillary: 176 mg/dL — ABNORMAL HIGH (ref 70–99)

## 2022-04-09 LAB — APTT: aPTT: 27 seconds (ref 24–36)

## 2022-04-09 LAB — ETHANOL: Alcohol, Ethyl (B): <10 mg/dL (ref <10)

## 2022-04-09 MED ORDER — ACETAMINOPHEN 160 MG/5ML PO SOLN: 650.0000 mg | ORAL | Status: DC | PRN

## 2022-04-09 MED ORDER — ASPIRIN 81 MG PO CHEW
324.0000 mg | CHEWABLE_TABLET | Freq: Once | ORAL | Status: AC
  Administered 2022-04-09: 324 mg via ORAL
  Filled 2022-04-09: qty 4

## 2022-04-09 MED ORDER — ACETAMINOPHEN 650 MG RE SUPP: 650.0000 mg | RECTAL | Status: DC | PRN

## 2022-04-09 MED ORDER — LORAZEPAM 1 MG PO TABS
0.5000 mg | ORAL_TABLET | ORAL | Status: AC
  Administered 2022-04-09: 0.5 mg via ORAL
  Filled 2022-04-09: qty 1

## 2022-04-09 MED ORDER — DONEPEZIL HCL 10 MG PO TABS
5.0000 mg | ORAL_TABLET | Freq: Every day | ORAL | Status: DC
  Administered 2022-04-09 – 2022-04-13 (×5): 5 mg via ORAL
  Filled 2022-04-09 (×5): qty 1

## 2022-04-09 MED ORDER — CLOPIDOGREL BISULFATE 300 MG PO TABS
300.0000 mg | ORAL_TABLET | Freq: Once | ORAL | Status: AC
  Administered 2022-04-09: 300 mg via ORAL
  Filled 2022-04-09: qty 1

## 2022-04-09 MED ORDER — ACETAMINOPHEN 325 MG PO TABS
650.0000 mg | ORAL_TABLET | ORAL | Status: DC | PRN
  Administered 2022-04-10 – 2022-04-11 (×2): 650 mg via ORAL
  Filled 2022-04-09 (×2): qty 2

## 2022-04-09 MED ORDER — IOHEXOL 350 MG/ML SOLN
60.0000 mL | Freq: Once | INTRAVENOUS | Status: AC | PRN
  Administered 2022-04-09: 60 mL via INTRAVENOUS

## 2022-04-09 MED ORDER — SENNOSIDES-DOCUSATE SODIUM 8.6-50 MG PO TABS: 1.0000 | ORAL_TABLET | Freq: Every evening | ORAL | Status: DC | PRN

## 2022-04-09 MED ORDER — HYDROXYZINE HCL 10 MG PO TABS
10.0000 mg | ORAL_TABLET | Freq: Every evening | ORAL | Status: DC | PRN
  Administered 2022-04-10 – 2022-04-11 (×2): 10 mg via ORAL
  Filled 2022-04-09 (×2): qty 1

## 2022-04-09 MED ORDER — STROKE: EARLY STAGES OF RECOVERY BOOK
Freq: Once | Status: AC
  Administered 2022-04-10: 1
  Filled 2022-04-09: qty 1

## 2022-04-09 MED ORDER — INSULIN ASPART 100 UNIT/ML IJ SOLN
0.0000 [IU] | Freq: Three times a day (TID) | INTRAMUSCULAR | Status: DC
  Administered 2022-04-10: 1 [IU] via SUBCUTANEOUS
  Administered 2022-04-11 – 2022-04-12 (×3): 2 [IU] via SUBCUTANEOUS
  Administered 2022-04-12: 3 [IU] via SUBCUTANEOUS
  Administered 2022-04-13: 2 [IU] via SUBCUTANEOUS
  Administered 2022-04-13: 1 [IU] via SUBCUTANEOUS
  Administered 2022-04-14: 2 [IU] via SUBCUTANEOUS
  Administered 2022-04-14: 1 [IU] via SUBCUTANEOUS

## 2022-04-09 NOTE — Progress Notes (Signed)
Inpatient Rehab Admissions Coordinator:   Per therapy recommendations,  patient was screened for CIR candidacy by Clemens Catholic, MS, CCC-SLP. At this time, work up is ongoing, MRI pending. I will follow up and rescreen once workup is complete. Please contact me any with questions.  Clemens Catholic, [place], [place] Admissions Coordinator  [phone] ([place]) [phone] (office)

## 2022-04-09 NOTE — Evaluation (Signed)
Physical Therapy Evaluation Patient Details Name: Grant Jensen. MRN: [id number] DOB: 07/01/1928 Today's Date: 04/09/2022  History of Present Illness  Pt is a 89 y.o. male who presented 04/09/22 with left sided weakness and dysarthria. Outside window for TNK. CT head shows no acute abnormality. Awaiting brain MRI. PMH: dementia, DM, blindness due to glaucoma   Clinical Impression  Pt presents with condition above and deficits mentioned below, see PT Problem List. PTA, he was able to ambulate around the house and navigate the x3 STE his 1-level house with bil UE support and guidance from his wife due to his blindness. Currently, pt is demonstrating some gross overall weakness, but noted pt to be weaker in his L extremities compared to his R. In addition, pt displayed L lower extremity incoordination along with potential sensation and proprioception deficits in his L lower extremity, evident by him withdrawing to stimuli, including touching the floor, with his foot and by him lifting and placing his L foot repeatedly with attempts to stand. He required mod-maxA for bed mobility and maxA to transfer to stand with UE support. Unable to safely progress to taking steps today. As pt has had a drastic functional decline and has good support at home, he could greatly benefit from intensive therapy in the AIR setting. Will continue to follow acutely.       Recommendations for follow up therapy are one component of a multi-disciplinary discharge planning process, led by the attending physician.  Recommendations may be updated based on patient status, additional functional criteria and insurance authorization.  Follow Up Recommendations Acute inpatient rehab (3hours/day)      Assistance Recommended at Discharge Frequent or constant Supervision/Assistance  Patient can return home with the following  Two people to help with walking and/or transfers;A lot of help with bathing/dressing/bathroom;Assistance with  cooking/housework;Direct supervision/assist for financial management;Direct supervision/assist for medications management;Assist for transportation;Help with stairs or ramp for entrance    Equipment Recommendations Hospital bed;Other (comment) (hoyer lift and pad-pending progress)  Recommendations for Other Services  Rehab consult    Functional Status Assessment Patient has had a recent decline in their functional status and demonstrates the ability to make significant improvements in function in a reasonable and predictable amount of time.     Precautions / Restrictions Precautions Precautions: Fall Restrictions Weight Bearing Restrictions: No      Mobility  Bed Mobility Overal bed mobility: Needs Assistance Bed Mobility: Supine to Sit, Sit to Supine     Supine to sit: Mod assist, HOB elevated Sit to supine: Max assist, HOB elevated   General bed mobility comments: Pt needing cues to guide him to EOB due to blindness, good initiation by pt to bring legs off R EOB and initiate trunk ascension but pt leans posteriorly, needing modA to ascend trunk fully and scoot hips to edge. MaxA to manage legs and trunk back to supine for pt safety as he was seeming to slide on the edge of stretcher.    Transfers Overall transfer level: Needs assistance Equipment used: Rolling walker (2 wheels), 1 person hand held assist Transfers: Sit to/from Stand Sit to Stand: Max assist, From elevated surface           General transfer comment: Pt cued to come to stand from edge of stretcher, x1 to pt holding onto therapist anterior to him and x1 to RW, both with bil knees blocked. Pt initiates pushing through his legs but leans posteriorly and needs assistance to shift weight anteriorly and extend  hips    Ambulation/Gait               General Gait Details: deferred  Stairs            Wheelchair Mobility    Modified Rankin (Stroke Patients Only) Modified Rankin (Stroke Patients  Only) Pre-Morbid Rankin Score: Moderately severe disability Modified Rankin: Severe disability     Balance Overall balance assessment: Needs assistance Sitting-balance support: Single extremity supported, Feet supported Sitting balance-Leahy Scale: Poor Sitting balance - Comments: Pt pushing through R UE, leaning posteriorly and to L, needing min-modA for static sitting balance. Postural control: Posterior lean, Left lateral lean Standing balance support: Bilateral upper extremity supported, During functional activity, Reliant on assistive device for balance Standing balance-Leahy Scale: Poor Standing balance comment: Reliant on UE support, bil knee block, and maxA to stand 2x for ~5-10 sec each                             Pertinent Vitals/Pain Pain Assessment Pain Assessment: Faces Faces Pain Scale: No hurt Pain Intervention(s): Monitored during session    Home Living Family/patient expects to be discharged to:: Private residence Living Arrangements: Spouse/significant other Available Help at Discharge: Family;Available 24 hours/day Type of Home: House Home Access: Stairs to enter Entrance Stairs-Rails: Psychiatric nurse of Steps: 3   Home Layout: One level Home Equipment: Conservation officer, nature (2 wheels);Transport chair;BSC/3in1;Shower seat;Grab bars - tub/shower;Hand held shower head      Prior Function Prior Level of Function : History of Falls (last six months);Needs assist             Mobility Comments: Pt requires bil HHA or RW for mobility and guidance for all standing mobility due to his blindness. Able to ambulate household distances and navigate stairs with guidance. Hx of falls. ADLs Comments: Wife assists with all ADLs due to blindness and dementia. Pt able to assist in dressing.     Hand Dominance        Extremity/Trunk Assessment   Upper Extremity Assessment Upper Extremity Assessment: Defer to OT evaluation    Lower  Extremity Assessment Lower Extremity Assessment: LLE deficits/detail;Generalized weakness LLE Deficits / Details: MMT scores of 4 hip flexion, 4+ knee extension; appeared to be hypersensitive to touch at the foot noted by pt withdrawing and often pulling foot up off the ground; possible deficits in sensation and proprioception noted by poor awareness of his foot location; incoordination noted LLE Coordination: decreased gross motor;decreased fine motor    Cervical / Trunk Assessment Cervical / Trunk Assessment: Kyphotic  Communication   Communication: HOH  Cognition Arousal/Alertness: Awake/alert Behavior During Therapy: Restless Overall Cognitive Status: History of cognitive impairments - at baseline                                 General Comments: Wife reports he seems at his baseline currently. Pt has dementia and asks questions frequently and fidgets with things often. Pt restless playing with leads/lines and asking what they were repeatedly. Extra time and repeated multi-modal cues needed for all mobility. Poor awareness of his posterior and L lateral lean or that his L foot was off the ground often.        General Comments General comments (skin integrity, edema, etc.): wife present and supportive    Exercises     Assessment/Plan    PT Assessment Patient needs continued  PT services  PT Problem List Decreased strength;Decreased activity tolerance;Decreased balance;Decreased mobility;Decreased coordination;Decreased cognition;Decreased knowledge of use of DME;Decreased safety awareness;Impaired sensation       PT Treatment Interventions DME instruction;Gait training;Functional mobility training;Stair training;Therapeutic activities;Therapeutic exercise;Balance training;Neuromuscular re-education;Cognitive remediation;Patient/family education;Wheelchair mobility training    PT Goals (Current goals can be found in the Care Plan section)  Acute Rehab PT  Goals Patient Stated Goal: did not state; wife hopeful for pt to improve PT Goal Formulation: With patient/family Time For Goal Achievement: 04/23/22 Potential to Achieve Goals: Good    Frequency Min 4X/week     Co-evaluation               AM-PAC PT "6 Clicks" Mobility  Outcome Measure Help needed turning from your back to your side while in a flat bed without using bedrails?: A Lot Help needed moving from lying on your back to sitting on the side of a flat bed without using bedrails?: A Lot Help needed moving to and from a bed to a chair (including a wheelchair)?: Total Help needed standing up from a chair using your arms (e.g., wheelchair or bedside chair)?: A Lot Help needed to walk in hospital room?: Total Help needed climbing 3-5 steps with a railing? : Total 6 Click Score: 9    End of Session Equipment Utilized During Treatment: Gait belt Activity Tolerance: Patient tolerated treatment well Patient left: in bed;with call bell/phone within reach;with family/visitor present;Other (comment) (with transport taking pt to MRI) Nurse Communication: Mobility status PT Visit Diagnosis: Unsteadiness on feet (R26.81);Other abnormalities of gait and mobility (R26.89);Repeated falls (R29.6);Muscle weakness (generalized) (M62.81);History of falling (Z91.81);Difficulty in walking, not elsewhere classified (R26.2);Other symptoms and signs involving the nervous system (R29.898)    Time: 8338-2505 PT Time Calculation (min) (ACUTE ONLY): 29 min   Charges:   PT Evaluation $PT Eval Moderate Complexity: 1 Mod PT Treatments $Therapeutic Activity: 8-22 mins        Moishe Spice, PT, DPT Acute Rehabilitation Services  Office: [place] 04/09/2022, 2:39 PM

## 2022-04-09 NOTE — ED Notes (Signed)
Patient transported to MRI 

## 2022-04-09 NOTE — Progress Notes (Signed)
Echo attempted at 2:10 pm. Nurse stated patient was go to MRI. Will attempt again as schedule permits.

## 2022-04-09 NOTE — ED Provider Notes (Addendum)
[facility] Provider Note   CSN: [id number] Arrival date & time: 04/09/22  1201     History  No chief complaint on file.   Grant Calkins. is a 89 y.o. male.  89 year old male with history of dementia, hypertension, hyperlipidemia, and diabetes presents emergency department with left-sided weakness.  History obtained initially per EMS who states that the patient went to bed at 8 PM and was normal.  This morning woke up at 7 AM and his wife noticed that he had been leaning to the left side and appeared to be weak on the left side with facial droop and slurred speech.  911 was called and he was brought to the emergency department for evaluation.  Additional history obtained per the patient's wife who states that when he awoke he was having difficulty getting out of bed.  Reported that he has been shaking in the morning.  Reports that he was shaking for several hours and was responding during his episodes.  No loss of consciousness.  Noticed that his left side was weak and he had slurred speech and facial droop.  She denies any recent illnesses for the patient including cough, runny nose, sore throat, fever, nausea or vomiting or diarrhea.  No recent urinary tract infections.  States that he typically knows his name but sometimes is confused as to the date and where he is.       Home Medications Prior to Admission medications   Medication Sig Start Date End Date Taking? Authorizing Provider  acetaminophen (TYLENOL) 325 MG tablet Take 2 tablets (650 mg total) by mouth every 6 (six) hours as needed for moderate pain. 05/04/20   Arrien, Jimmy Picket, MD  aspirin EC 81 MG tablet Take 81 mg by mouth daily.    [provider]  hydrOXYzine (ATARAX) 25 MG tablet Take 1 at night as needed for sleep 08/25/21   Milton Ferguson, MD  isosorbide mononitrate (IMDUR) 30 MG 24 hr tablet Take 30 mg by mouth daily.    [provider]  metFORMIN  (GLUCOPHAGE) 500 MG tablet Take 1 tablet (500 mg total) by mouth daily with breakfast. 06/23/14   Adrian Prows, MD  omeprazole (PRILOSEC) 20 MG capsule Take 20 mg by mouth daily.    [provider]  ondansetron (ZOFRAN-ODT) 8 MG disintegrating tablet Take 1 tablet (8 mg total) by mouth every 8 (eight) hours as needed for nausea or vomiting. 94/85/46   Delora Fuel, MD  predniSONE (DELTASONE) 50 MG tablet Take 1 tablet (50 mg total) by mouth daily. 27/03/50   Delora Fuel, MD      Allergies    Lisinopril    Review of Systems   Review of Systems  Physical Exam Updated Vital Signs BP 123/68   Pulse 81   Resp 19   SpO2 100%  Physical Exam Vitals and nursing note reviewed.  Constitutional:      General: He is not in acute distress.    Appearance: He is well-developed.  HENT:     Head: Normocephalic and atraumatic.     Right Ear: External ear normal.     Left Ear: External ear normal.     Nose: Nose normal.  Eyes:     Extraocular Movements: Extraocular movements intact.     Conjunctiva/sclera: Conjunctivae normal.     Comments: Pupils nonreactive bilaterally  Cardiovascular:     Rate and Rhythm: Normal rate and regular rhythm.  Heart sounds: Normal heart sounds.  Pulmonary:     Effort: Pulmonary effort is normal. No respiratory distress.     Breath sounds: Normal breath sounds.  Abdominal:     General: There is no distension.     Palpations: Abdomen is soft. There is no mass.     Tenderness: There is no abdominal tenderness. There is no guarding.  Musculoskeletal:     Cervical back: Normal range of motion and neck supple.     Right lower leg: No edema.     Left lower leg: No edema.  Skin:    General: Skin is warm and dry.  Neurological:     Mental Status: He is alert.     Comments: Pupils nonreactive bilaterally.  Slight left-sided facial droop.  Cranial nerves II through XII otherwise intact. 4/5 strength in left upper and left lower extremity.  4/5 strength in  right lower extremity. Intact sensation to light touch bilaterally. Mild slurred speech per the patient's wife.  Psychiatric:        Mood and Affect: Mood normal.        Behavior: Behavior normal.     ED Results / Procedures / Treatments   Labs (all labs ordered are listed, but only abnormal results are displayed) Labs Reviewed  I-STAT CHEM 8, ED - Abnormal; Notable for the following components:      Result Value   Creatinine, Ser 1.70 (*)    Glucose, Bld 160 (*)    Calcium, Ion 1.09 (*)    All other components within normal limits  CBG MONITORING, ED - Abnormal; Notable for the following components:   Glucose-Capillary 176 (*)    All other components within normal limits  RESP PANEL BY RT-PCR (RSV, FLU A&B, COVID)  [id number]  PROTIME-INR  APTT  ETHANOL  CBC  DIFFERENTIAL  COMPREHENSIVE METABOLIC PANEL  RAPID URINE DRUG SCREEN, HOSP PERFORMED  URINALYSIS, ROUTINE W REFLEX MICROSCOPIC    EKG None  Radiology No results found.  Procedures Procedures   Medications Ordered in ED Medications  LORazepam (ATIVAN) tablet 0.5 mg (has no administration in time range)  aspirin chewable tablet 324 mg (has no administration in time range)  clopidogrel (PLAVIX) tablet 300 mg (has no administration in time range)  iohexol (OMNIPAQUE) 350 MG/ML injection 60 mL (60 mLs Intravenous Contrast Given 04/09/22 1246)    ED Course/ Medical Decision Making/ A&P Clinical Course as of 04/09/22 1252  Sun Apr 09, 2022  1210 Code stroke activated. [RP]    Clinical Course User Index [RP] Fransico Meadow, MD                            Medical Decision Making Amount and/or Complexity of Data Reviewed Labs: ordered. Radiology: ordered.  Risk OTC drugs. Prescription drug management. Decision regarding hospitalization.   Grant Calkins. is a 89 y.o. male with comorbidities that complicate the patient evaluation including dementia, hypertension, hyperlipidemia, and diabetes presents  emergency department with left-sided weakness.   Initial Ddx:  Stroke, TIA, seizure, rigors  MDM:  Feel the patient likely had either stroke or TIA causing his symptoms.  Deficits do appear mild at this time so will activate code stroke since it occurred within 24 hours and could potentially have LVO with his slurred speech and left-sided weakness.  Will obtain CTA and consult neurology. Patient was likely having rigors given the fact that he was shaking for hours.  With  retained consciousness and this persisting for hours feel that seizures are less likely.  This may be representative of infection so we will test him for urinary tract infection and other common sources of infection.  Plan:  Labs COVID/flu Urinalysis Chest x-ray EKG CTA head Code stroke activation  ED Summary/Re-evaluation:  Patient reassessed remained stable in the emergency department.  Neurology has evaluated the patient and felt that LVO was less likely.  CTA was obtained that did not show evidence of LVO.  They recommended admission to medicine for further management of his stroke as well as MRI without contrast and aspirin and Plavix.  This patient presents to the ED for concern of complaints listed in HPI, this involves an extensive number of treatment options, and is a complaint that carries with it a high risk of complications and morbidity. Disposition including potential need for admission considered.   Dispo: Admit to Floor  Additional history obtained from spouse Records reviewed Admission Notes The following labs were independently interpreted: Chemistry and show CKD I independently reviewed the following imaging with scope of interpretation limited to determining acute life threatening conditions related to emergency care: CT Head and agree with the radiologist interpretation with the following exceptions: none I personally reviewed and interpreted cardiac monitoring: normal sinus rhythm  I personally  reviewed and interpreted the pt's EKG: see above for interpretation  I have reviewed the patients home medications and made adjustments as needed Consults: Neurology - Dr Lorrin Goodell Social Determinants of health:  Elderly, dementia  Final Clinical Impression(s) / ED Diagnoses Final diagnoses:  Cerebrovascular accident (CVA), unspecified mechanism ([place])  Left-sided weakness    Rx / DC Orders ED Discharge Orders     None      CRITICAL CARE Performed by: Fransico Meadow   Total critical care time: 30 minutes  Critical care time was exclusive of separately billable procedures and treating other patients.  Critical care was necessary to treat or prevent imminent or life-threatening deterioration.  Critical care was time spent personally by me on the following activities: development of treatment plan with patient and/or surrogate as well as nursing, discussions with consultants, evaluation of patient's response to treatment, examination of patient, obtaining history from patient or surrogate, ordering and performing treatments and interventions, ordering and review of laboratory studies, ordering and review of radiographic studies, pulse oximetry and re-evaluation of patient's condition.    Fransico Meadow, MD 04/09/22 1253    Fransico Meadow, MD 04/09/22 1255

## 2022-04-09 NOTE — ED Notes (Signed)
Code stroke-- activated 12:10 by KM per Dr. Shon Baton orders

## 2022-04-09 NOTE — ED Notes (Signed)
ED TO INPATIENT HANDOFF REPORT  ED Nurse Name and Phone #: Rhae Lerner [phone]  S Name/Age/Gender Grant Jensen. 88 y.o. male Room/Bed: 011C/011C  Code Status   Code Status: DNR  Home/SNF/Other Home Patient oriented to: self Is this baseline? Yes   Triage Complete: Triage complete  Chief Complaint Acute CVA (cerebrovascular accident) [facility]) [I63.9]  Triage Note Pt BIB EMS from home. Per EMS, pt went to bed last night around 2000 as normal. At 0700 today, pt called wife and noticed having some slurred speech and mild left arm and left leg weakness. Pt is blind and demented at baseline.    Allergies Allergies  Allergen Reactions   Lisinopril Cough   Penicillin G Hives and Rash    Level of Care/Admitting Diagnosis ED Disposition     ED Disposition  Admit   Condition  --   Comment  Hospital Area: [facility] [[id number]]  Level of Care: Telemetry Medical [104]  May place patient in observation at [facility] or [facility] if equivalent level of care is available:: No  Covid Evaluation: Asymptomatic - no recent exposure (last 10 days) testing not required  Diagnosis: Acute CVA (cerebrovascular accident) [facility]) [[id number]]  Admitting Physician: Synetta Fail [[id number]]  Attending Physician: Synetta Fail [phone]          B Medical/Surgery History Past Medical History:  Diagnosis Date   Diabetes mellitus without complication (HCC)    Glaucoma    Past Surgical History:  Procedure Laterality Date   LEFT HEART CATHETERIZATION WITH CORONARY ANGIOGRAM N/A 06/22/2014   Procedure: LEFT HEART CATHETERIZATION WITH CORONARY ANGIOGRAM;  Surgeon: Yates Decamp, MD;  Location: [facility] CATH LAB;  Service: Cardiovascular;  Laterality: N/A;     A IV Location/Drains/Wounds Patient Lines/Drains/Airways Status     Active Line/Drains/Airways     Name Placement date Placement time Site Days   Peripheral IV 04/09/22 20 G Anterior;Left Forearm 04/09/22  1203   Forearm  less than 1   Wound / Incision (Open or Dehisced) 05/01/20 Skin tear Penis 05/01/20  1429  Penis  708            Intake/Output Last 24 hours No intake or output data in the 24 hours ending 04/09/22 1355  Labs/Imaging Results for orders placed or performed during the hospital encounter of 04/09/22 (from the past 48 hour(s))  Ethanol     Status: None   Collection Time: 04/09/22 12:10 PM  Result Value Ref Range   Alcohol, Ethyl (B) <10 <10 mg/dL    Comment: (NOTE) Lowest detectable limit for serum alcohol is 10 mg/dL.  For medical purposes only. Performed at [facility], [address], [place], [place] [address]   Protime-INR     Status: None   Collection Time: 04/09/22 12:10 PM  Result Value Ref Range   Prothrombin Time 13.4 11.4 - 15.2 seconds   INR 1.0 0.8 - 1.2    Comment: (NOTE) INR goal varies based on device and disease states. Performed at [facility], [address] [place], [place] [address]   APTT     Status: None   Collection Time: 04/09/22 12:10 PM  Result Value Ref Range   aPTT 27 24 - 36 seconds    Comment: Performed at [facility], [address], [place], [place] [address]  CBC     Status: Abnormal   Collection Time: 04/09/22 12:10 PM  Result Value Ref Range   WBC  7.5 4.0 - 10.5 K/uL   RBC 4.64 4.22 - 5.81 MIL/uL   Hemoglobin 11.3 (L) 13.0 - 17.0 g/dL   HCT 62.8 (L) 36.6 - 29.4 %   MCV 79.7 (L) 80.0 - 100.0 fL   MCH 24.4 (L) 26.0 - 34.0 pg   MCHC 30.5 30.0 - 36.0 g/dL   RDW 76.5 46.5 - 03.5 %   Platelets 171 150 - 400 K/uL   nRBC 0.0 0.0 - 0.2 %    Comment: Performed at [facility], [address] [place], [place] [address]  Differential     Status: None   Collection Time: 04/09/22 12:10 PM  Result Value Ref Range   Neutrophils Relative % 32 %   Neutro Abs 2.4 1.7 - 7.7 K/uL   Lymphocytes Relative 52 %   Lymphs Abs 3.9 0.7 - 4.0 K/uL   Monocytes Relative 9 %   Monocytes Absolute 0.7 0.1 - 1.0 K/uL    Eosinophils Relative 6 %   Eosinophils Absolute 0.5 0.0 - 0.5 K/uL   Basophils Relative 1 %   Basophils Absolute 0.0 0.0 - 0.1 K/uL   Immature Granulocytes 0 %   Abs Immature Granulocytes 0.01 0.00 - 0.07 K/uL    Comment: Performed at [facility], [address] [place], [place] [address]  Comprehensive metabolic panel     Status: Abnormal   Collection Time: 04/09/22 12:10 PM  Result Value Ref Range   Sodium 135 135 - 145 mmol/L   Potassium 4.0 3.5 - 5.1 mmol/L   Chloride 104 98 - 111 mmol/L   CO2 20 (L) 22 - 32 mmol/L   Glucose, Bld 162 (H) 70 - 99 mg/dL    Comment: Glucose reference range applies only to samples taken after fasting for at least 8 hours.   BUN 15 8 - 23 mg/dL   Creatinine, Ser 7.00 (H) 0.61 - 1.24 mg/dL   Calcium 9.0 8.9 - 17.4 mg/dL   Total Protein 6.6 6.5 - 8.1 g/dL   Albumin 3.9 3.5 - 5.0 g/dL   AST 32 15 - 41 U/L   ALT 16 0 - 44 U/L   Alkaline Phosphatase 57 38 - 126 U/L   Total Bilirubin 0.3 0.3 - 1.2 mg/dL   GFR, Estimated 39 (L) >60 mL/min    Comment: (NOTE) Calculated using the CKD-EPI Creatinine Equation (2021)    Anion gap 11 5 - 15    Comment: Performed at [facility], [address], [place], [place] [address]  CBG monitoring, ED     Status: Abnormal   Collection Time: 04/09/22 12:20 PM  Result Value Ref Range   Glucose-Capillary 176 (H) 70 - 99 mg/dL    Comment: Glucose reference range applies only to samples taken after fasting for at least 8 hours.  I-stat chem 8, ED     Status: Abnormal   Collection Time: 04/09/22 12:30 PM  Result Value Ref Range   Sodium 137 135 - 145 mmol/L   Potassium 4.0 3.5 - 5.1 mmol/L   Chloride 104 98 - 111 mmol/L   BUN 18 8 - 23 mg/dL   Creatinine, Ser 7.59 (H) 0.61 - 1.24 mg/dL   Glucose, Bld 163 (H) 70 - 99 mg/dL    Comment: Glucose reference range applies only to samples taken after fasting for at least 8 hours.   Calcium, Ion 1.09 (L) 1.15 - 1.40 mmol/L   TCO2 22 22 - 32 mmol/L   Hemoglobin  13.3 13.0 - 17.0 g/dL   HCT 39.0 39.0 - 52.0 %   DG Chest Portable 1 View  Result Date: 04/09/2022 CLINICAL DATA:  Rule out pneumonia. Right years. Slurred speech and mild left arm pain and left leg weakness. EXAM: PORTABLE CHEST 1 VIEW COMPARISON:  12/06/2021 FINDINGS: Stable cardiomediastinal contours. No pleural effusion or edema. Chronic bronchitic changes are identified bilaterally. No superimposed airspace consolidation. Visualized osseous structures are unremarkable. IMPRESSION: 1. No acute findings. 2. Chronic bronchitic changes. Electronically Signed   By: Kerby Moors M.D.   On: 04/09/2022 13:17   CT ANGIO HEAD NECK W WO CM (CODE STROKE)  Result Date: 04/09/2022 CLINICAL DATA:  Slurred speech.  Left-sided weakness. EXAM: CT ANGIOGRAPHY HEAD AND NECK TECHNIQUE: Multidetector CT imaging of the head and neck was performed using the standard protocol during bolus administration of intravenous contrast. Multiplanar CT image reconstructions and MIPs were obtained to evaluate the vascular anatomy. Carotid stenosis measurements (when applicable) are obtained utilizing NASCET criteria, using the distal internal carotid diameter as the denominator. RADIATION DOSE REDUCTION: This exam was performed according to the departmental dose-optimization program which includes automated exposure control, adjustment of the mA and/or kV according to patient size and/or use of iterative reconstruction technique. CONTRAST:  75 ml Omni 350 COMPARISON:  CT Head 04/30/21 FINDINGS: CT HEAD FINDINGS Brain: No evidence of acute infarction, hemorrhage, hydrocephalus, extra-axial collection or mass lesion/mass effect. Is sequela of severe chronic microvascular ischemic change with advanced generalized volume loss. No evidence of hydrocephalus. Vascular: No hyperdense vessel or unexpected calcification. Skull: Normal. Negative for fracture or focal lesion. Sinuses/Orbits: No mastoid or middle ear effusion. Paranasal sinuses are  clear. Bilateral lens replacement. Orbits are otherwise unremarkable. Other: None. Review of the MIP images confirms the above findings CTA NECK FINDINGS Aortic arch: Standard branching. Imaged portion shows no evidence of aneurysm or dissection. No significant stenosis of the major arch vessel origins. Right carotid system: No evidence of dissection, stenosis (50% or greater), or occlusion. Mild atherosclerotic calcification at the carotid bifurcation Left carotid system: No evidence of dissection, stenosis (50% or greater), or occlusion. Vertebral arteries: Moderate stenosis of the origin of the left vertebral artery secondary to calcified atherosclerotic plaque. Skeleton: There are degenerative changes at C1-C2 articulation where there is a large pannus and ligamentum flavum hypertrophy. This results in moderate spinal canal stenosis at the craniocervical junction. Other neck: Negative. Upper chest: Biapical bronchial wall thickening as can be seen in the setting of bronchitis or small airways disease. Review of the MIP images confirms the above findings CTA HEAD FINDINGS Anterior circulation: 1 of the proximal M2 branches on the right is likely occluded (series 7, image 99). ) Posterior circulation: Moderate focal stenosis in the P2 segment of the right PCA (series 11, image 77). Venous sinuses: As permitted by contrast timing, patent. Anatomic variants: None Review of the MIP images confirms the above findings IMPRESSION: 1. No hemorrhage or CT evidence of an acute infarct.  Aspects is 10. 2. One of the proximal M2 branches on the right is severely stenosed to nearly occluded. 3. Moderate focal stenosis in the P2 segment of the right PCA. 4. Moderate stenosis of the origin of the left vertebral artery. 5. Moderate spinal canal stenosis at the craniocervical junction due to ligamentum falvum hypertrophy and pannus. Findings were paged to Dr. Lorrin Goodell on 04/09/22 at 12:50 PM via [facility] paging system.  Electronically Signed   By: Marin Roberts M.D.   On: 04/09/2022 12:57  CT HEAD CODE STROKE WO CONTRAST`  Result Date: 04/09/2022 CLINICAL DATA:  Slurred speech.  Left-sided weakness. EXAM: CT ANGIOGRAPHY HEAD AND NECK TECHNIQUE: Multidetector CT imaging of the head and neck was performed using the standard protocol during bolus administration of intravenous contrast. Multiplanar CT image reconstructions and MIPs were obtained to evaluate the vascular anatomy. Carotid stenosis measurements (when applicable) are obtained utilizing NASCET criteria, using the distal internal carotid diameter as the denominator. RADIATION DOSE REDUCTION: This exam was performed according to the departmental dose-optimization program which includes automated exposure control, adjustment of the mA and/or kV according to patient size and/or use of iterative reconstruction technique. CONTRAST:  75 ml Omni 350 COMPARISON:  CT Head 04/30/21 FINDINGS: CT HEAD FINDINGS Brain: No evidence of acute infarction, hemorrhage, hydrocephalus, extra-axial collection or mass lesion/mass effect. Is sequela of severe chronic microvascular ischemic change with advanced generalized volume loss. No evidence of hydrocephalus. Vascular: No hyperdense vessel or unexpected calcification. Skull: Normal. Negative for fracture or focal lesion. Sinuses/Orbits: No mastoid or middle ear effusion. Paranasal sinuses are clear. Bilateral lens replacement. Orbits are otherwise unremarkable. Other: None. Review of the MIP images confirms the above findings CTA NECK FINDINGS Aortic arch: Standard branching. Imaged portion shows no evidence of aneurysm or dissection. No significant stenosis of the major arch vessel origins. Right carotid system: No evidence of dissection, stenosis (50% or greater), or occlusion. Mild atherosclerotic calcification at the carotid bifurcation Left carotid system: No evidence of dissection, stenosis (50% or greater), or occlusion. Vertebral  arteries: Moderate stenosis of the origin of the left vertebral artery secondary to calcified atherosclerotic plaque. Skeleton: There are degenerative changes at C1-C2 articulation where there is a large pannus and ligamentum flavum hypertrophy. This results in moderate spinal canal stenosis at the craniocervical junction. Other neck: Negative. Upper chest: Biapical bronchial wall thickening as can be seen in the setting of bronchitis or small airways disease. Review of the MIP images confirms the above findings CTA HEAD FINDINGS Anterior circulation: 1 of the proximal M2 branches on the right is likely occluded (series 7, image 99). ) Posterior circulation: Moderate focal stenosis in the P2 segment of the right PCA (series 11, image 77). Venous sinuses: As permitted by contrast timing, patent. Anatomic variants: None Review of the MIP images confirms the above findings IMPRESSION: 1. No hemorrhage or CT evidence of an acute infarct.  Aspects is 10. 2. One of the proximal M2 branches on the right is severely stenosed to nearly occluded. 3. Moderate focal stenosis in the P2 segment of the right PCA. 4. Moderate stenosis of the origin of the left vertebral artery. 5. Moderate spinal canal stenosis at the craniocervical junction due to ligamentum falvum hypertrophy and pannus. Findings were paged to Dr. Lorrin Goodell on 04/09/22 at 12:50 PM via [facility] paging system. Electronically Signed   By: Marin Roberts M.D.   On: 04/09/2022 12:57    Pending Labs Unresulted Labs (From admission, onward)     Start     Ordered   04/10/22 0500  Lipid panel  (Labs)  Tomorrow morning,   R       Comments: Fasting    04/09/22 1303   04/10/22 0500  Hemoglobin A1c  (Labs)  Tomorrow morning,   R       Comments: To assess prior glycemic control    04/09/22 1303   04/10/22 0500  CBC  (Labs)  Tomorrow morning,   R        04/09/22 1343  04/10/22 0500  Comprehensive metabolic panel  (Labs)  Tomorrow morning,   R        04/09/22 1343    04/09/22 1237  Resp panel by RT-PCR (RSV, Flu A&B, Covid) Anterior Nasal Swab  (Tier 2 - SymptomaticResp panel by RT-PCR (RSV, Flu A&B, Covid))  Once,   URGENT        04/09/22 1236   04/09/22 1210  Urine rapid drug screen (hosp performed)  Once,   STAT        04/09/22 1210   04/09/22 1210  Urinalysis, Routine w reflex microscopic -Urine, Clean Catch  Once,   URGENT       Question:  Specimen Source  Answer:  Urine, Clean Catch   04/09/22 1210            Vitals/Pain Today's Vitals   04/09/22 1203 04/09/22 1245  BP:  123/68  Pulse:  81  Resp:  19  SpO2: 100% 100%    Isolation Precautions Airborne and Contact precautions  Medications Medications  LORazepam (ATIVAN) tablet 0.5 mg (has no administration in time range)   stroke: early stages of recovery book (has no administration in time range)  donepezil (ARICEPT) tablet 5 mg (has no administration in time range)  hydrOXYzine (ATARAX) tablet 10 mg (has no administration in time range)  acetaminophen (TYLENOL) tablet 650 mg (has no administration in time range)    Or  acetaminophen (TYLENOL) 160 MG/5ML solution 650 mg (has no administration in time range)    Or  acetaminophen (TYLENOL) suppository 650 mg (has no administration in time range)  senna-docusate (Senokot-S) tablet 1 tablet (has no administration in time range)  iohexol (OMNIPAQUE) 350 MG/ML injection 60 mL (60 mLs Intravenous Contrast Given 04/09/22 1246)  aspirin chewable tablet 324 mg (324 mg Oral Given 04/09/22 1326)  clopidogrel (PLAVIX) tablet 300 mg (300 mg Oral Given 04/09/22 1326)    Mobility walks with person assist     Focused Assessments Neuro Assessment Handoff:  Swallow screen pass? Yes    NIH Stroke Scale  Level of Consciousness (1a.)   : Alert, keenly responsive LOC Questions (1b. )   : Answers one question correctly LOC Commands (1c. )   : Performs both tasks correctly Best Gaze (2. )  : Normal Visual (3. )  : Complete hemianopia Facial  Palsy (4. )    : Normal symmetrical movements Motor Arm, Left (5a. )   : No drift Motor Arm, Right (5b. ) : No drift Motor Leg, Left (6a. )  : No drift Motor Leg, Right (6b. ) : No drift Limb Ataxia (7. ): Absent Sensory (8. )  : Mild-to-moderate sensory loss, patient feels pinprick is less sharp or is dull on the affected side, or there is a loss of superficial pain with pinprick, but patient is aware of being touched Best Language (9. )  : No aphasia Dysarthria (10. ): Normal Extinction/Inattention (11.)   : No Abnormality Complete NIHSS TOTAL: 4     Neuro Assessment:   Neuro Checks:      Has TPA been given? No If patient is a Neuro Trauma and patient is going to OR before floor call report to [facility] nurse: [phone] or [phone]   R Recommendations: See Admitting Provider Note  Report given to:   Additional Notes: Pt blind and demented @ baseline. Per pt's wife, pt needs hands on assistance with ambulation.

## 2022-04-09 NOTE — Progress Notes (Signed)
EEG complete - results pending 

## 2022-04-09 NOTE — Consult Note (Addendum)
Neurology Consultation  Reason for Consult: left sided weakness Referring Physician: Dr. Philip Aspen  CC: none  History is obtained from:patient, family and chart  HPI: Grant Jensen. is a 89 y.o. male with history of dementia, blindness due to glaucoma and diabetes who was last known to be normal before he went to bed last night at 2000.  This morning at 0700, he was noted by his wife to have left sided weakness and slurred speech.  He was unable to stand up to be cleaned up after being incontinent of urine.  He also had some shaking of bilateral hands and feet, which has not occurred before, but was able to converse normally while this was going on.   LKW: 2/3 2000 TNK given?: no, outside of window IR Thrombectomy? No, no LVO Modified Rankin Scale: 4-Needs assistance to walk and tend to bodily needs  ROS:  Unable to obtain due to altered mental status.   Past Medical History:  Diagnosis Date   Diabetes mellitus without complication ([place])    Glaucoma      No family history on file.   Social History:   reports that he has never smoked. He does not have any smokeless tobacco history on file. He reports current alcohol use. No history on file for drug use.  Medications  Current Facility-Administered Medications:    [START ON 04/10/2022]  stroke: early stages of recovery book, , Does not apply, Once, de La Torre, Cortney E, NP   aspirin chewable tablet 324 mg, 324 mg, Oral, Once, Fransico Meadow, MD   clopidogrel (PLAVIX) tablet 300 mg, 300 mg, Oral, Once, Fransico Meadow, MD   LORazepam (ATIVAN) tablet 0.5 mg, 0.5 mg, Oral, On Call, Fransico Meadow, MD  Current Outpatient Medications:    albuterol (VENTOLIN HFA) 108 (90 Base) MCG/ACT inhaler, Inhale 2 puffs into the lungs every 4 (four) hours as needed for wheezing or shortness of breath., Disp: , Rfl:    aspirin EC 81 MG tablet, Take 81 mg by mouth daily., Disp: , Rfl:    donepezil (ARICEPT) 5 MG tablet, Take 5 mg by  mouth at bedtime., Disp: , Rfl:    glimepiride (AMARYL) 1 MG tablet, Take 1 mg by mouth every morning., Disp: , Rfl:    hydrOXYzine (ATARAX) 25 MG tablet, Take 1 at night as needed for sleep (Patient taking differently: Take 10 mg by mouth at bedtime as needed (sleep). Take 1 at night as needed for sleep), Disp: 30 tablet, Rfl: 0   ipratropium-albuterol (DUONEB) 0.5-2.5 (3) MG/3ML SOLN, Take 3 mLs by nebulization every 6 (six) hours as needed (SOB)., Disp: , Rfl:    latanoprost (XALATAN) 0.005 % ophthalmic solution, Place 1 drop into both eyes at bedtime., Disp: , Rfl:    losartan (COZAAR) 25 MG tablet, Take 25 mg by mouth daily., Disp: , Rfl:    metFORMIN (GLUCOPHAGE) 500 MG tablet, Take 1 tablet (500 mg total) by mouth daily with breakfast. (Patient taking differently: Take 500 mg by mouth 2 (two) times daily.), Disp: , Rfl:    metoprolol succinate (TOPROL-XL) 25 MG 24 hr tablet, Take 25 mg by mouth daily., Disp: , Rfl:    omeprazole (PRILOSEC) 20 MG capsule, Take 20 mg by mouth every other day., Disp: , Rfl:    acetaminophen (TYLENOL) 325 MG tablet, Take 2 tablets (650 mg total) by mouth every 6 (six) hours as needed for moderate pain., Disp: , Rfl:    isosorbide mononitrate (IMDUR)  30 MG 24 hr tablet, Take 30 mg by mouth daily. (Patient not taking: Reported on 04/09/2022), Disp: , Rfl:    predniSONE (DELTASONE) 50 MG tablet, Take 1 tablet (50 mg total) by mouth daily. (Patient not taking: Reported on 04/09/2022), Disp: 5 tablet, Rfl: 0   Exam: Current vital signs: BP 123/68   Pulse 81   Resp 19   SpO2 100%  Vital signs in last 24 hours: Pulse Rate:  [81] 81 (02/04 1245) Resp:  [19] 19 (02/04 1245) BP: (123)/(68) 123/68 (02/04 1245) SpO2:  [100 %] 100 % (02/04 1245)  GENERAL: Awake, alert, in no acute distress Psych: Affect appropriate for situation, patient is anxious at times Head: Normocephalic and atraumatic, without obvious abnormality EENT: Normal conjunctivae, moist mucous  membranes, no OP obstruction LUNGS: Normal respiratory effort. Non-labored breathing on room air Extremities: warm, well perfused, without obvious deformity  NEURO:  Mental Status: Awake, alert, and oriented to person but disoriented to place time and situation He is not able to provide a clear and coherent history of present illness. Speech/Language: speech is clear and fluent, but slightly dysarthric per family.   No neglect is noted Cranial Nerves:  II: Pupils equal and round, no vision due to glaucoma III, IV, VI: EOMI. Lid elevation symmetric and full.  V: Sensation is intact to light touch and symmetrical to face.  VII: Face is symmetric resting and smiling.  VIII: Hearing intact to voice IX, X: Voice slightly dysarthric per patient's family XI: Normal sternocleidomastoid and trapezius muscle strength XII: Tongue protrudes midline without fasciculations.   Motor: 5/5 strength in right upper extremity and right lower extremity, 4+ out of 5 strength in left upper extremity and subtle drift in left lower extremity Tone is normal. Bulk is normal.  Sensation: Intact to light touch bilaterally in all four extremities. No extinction to DSS present.  Coordination: Difficult to assess due to blindness but able to find nose with fingers on both hands.  No drift in arms, but subtle drift in left lower extremity  Gait: Deferred  NIHSS: 1a Level of Conscious.: 0 1b LOC Questions: 2 1c LOC Commands: 1 2 Best Gaze: 0 3 Visual: 3 4 Facial Palsy:  5a Motor Arm - left: 0 5b Motor Arm - Right:0  6a Motor Leg - Left: 1 6b Motor Leg - Right: 0 7 Limb Ataxia: 0 8 Sensory: 0 9 Best Language: 0 10 Dysarthria: 1 11 Extinct. and Inatten.: 0 TOTAL: 8   Labs I have reviewed labs in epic and the results pertinent to this consultation are:   CBC    Component Value Date/Time   WBC 7.5 04/09/2022 1210   RBC 4.64 04/09/2022 1210   HGB 13.3 04/09/2022 1230   HCT 39.0 04/09/2022 1230   PLT  171 04/09/2022 1210   MCV 79.7 (L) 04/09/2022 1210   MCH 24.4 (L) 04/09/2022 1210   MCHC 30.5 04/09/2022 1210   RDW 14.1 04/09/2022 1210   LYMPHSABS 3.9 04/09/2022 1210   MONOABS 0.7 04/09/2022 1210   EOSABS 0.5 04/09/2022 1210   BASOSABS 0.0 04/09/2022 1210    CMP     Component Value Date/Time   NA 137 04/09/2022 1230   K 4.0 04/09/2022 1230   CL 104 04/09/2022 1230   CO2 21 (L) 08/25/2021 1920   GLUCOSE 160 (H) 04/09/2022 1230   BUN 18 04/09/2022 1230   CREATININE 1.70 (H) 04/09/2022 1230   CALCIUM 9.1 08/25/2021 1920   PROT 7.5 04/29/2021 2253  ALBUMIN 4.2 04/29/2021 2253   AST 32 04/29/2021 2253   ALT 15 04/29/2021 2253   ALKPHOS 83 04/29/2021 2253   BILITOT 0.6 04/29/2021 2253   GFRNONAA 45 (L) 08/25/2021 1920   GFRAA 64 (L) 05/08/2014 1653    Lipid Panel  No results found for: "CHOL", "TRIG", "HDL", "CHOLHDL", "VLDL", "LDLCALC", "LDLDIRECT"   Imaging I have reviewed the images obtained:  CT-scan of the brain: no acute abnormality  CTA head and neck: Severe stenosis of a proximal M2 branch on the right, moderate focal stenosis in P2 segment of right PCA, moderate stenosis of origin of left vertebral artery  MRI examination of the brain: pending  Assessment: 89 year old patient with history of dementia, diabetes and blindness due to glaucoma presents with left sided weakness and dysarthria which started when he awoke this morning.  Symptoms have improved, but voice remains slightly dysarthric per wife, and subtle weakness of the left hand was noted, as well as left-sided leg drift.  He also had some shaking of bilateral hands and feet this morning, which has not happened before.  Will obtain routine EEG.  CT head shows no acute abnormality, and CTA shows no LVO at this time.  Will obtain brain MRI, as patient has likely had a small stroke.  Patient's wife states that he requires assistance with activities of daily living and walking, mostly due to blindness and  confusion from dementia.  Patient will benefit from physical therapy/Occupational Therapy evaluation.  Patient's wife states that his last A1c was 11 or 12, will recheck here and consult diabetes coordinator if appropriate.  Impression:Acute ischemic stroke, likely due to small vessel disease  Recommendations: Stroke/TIA Workup  - Admit for stroke workup - Permissive HTN x48 hrs from sx onset or until stroke ruled out by MRI goal BP <220/110. PRN labetalol or hydralazine if BP above these parameters. Avoid oral antihypertensives. - MRI brain wo contrast - CTA/MRA if not already obtained - TTE w/ bubble - Check A1c and LDL + add statin per guidelines - aspirin 81 mg daily and plavix 75 mg daily antiplt/anticoag - q4 hr neuro checks - STAT head CT for any change in neuro exam - Tele - PT/OT/SLP - Stroke education - Amb referral to neurology upon discharge   -Routine EEG  Pt seen by NP/Neuro and later by MD. Note/plan to be edited by MD as needed.  [place] , MSN, AGACNP-BC [facility] See Amion for schedule and pager information 04/09/2022 1:06 PM   NEUROHOSPITALIST ADDENDUM Performed a face to face diagnostic evaluation.   I have reviewed the contents of history and physical exam as documented by PA/ARNP/Resident and agree with above documentation.  I have discussed and formulated the above plan as documented. Edits to the note have been made as needed.  Impression/Key exam findings/Plan: 55M with dementia, glaucoma, DM2, HTN who has an episode of dysarthria, Left sided weakness which he woke up with this AM. Resolved at this time. CTH and CTA negative.  Overall, suspicion for TIA in a patient with DM2, Htn. Will get routine EEG in addition given BL shaking this AM which was tremulousness but has not had before.  Stroke workup as above.  Donnetta Simpers, MD [facility] [phone]   If 7pm to 7am, please call on call as listed on  AMION.

## 2022-04-09 NOTE — ED Triage Notes (Signed)
Pt BIB EMS from home. Per EMS, pt went to bed last night around 2000 as normal. At 0700 today, pt called wife and noticed having some slurred speech and mild left arm and left leg weakness. Pt is blind and demented at baseline.

## 2022-04-09 NOTE — Plan of Care (Signed)

## 2022-04-09 NOTE — Progress Notes (Signed)
Per RN pt is otw to MRI check back in 57mins

## 2022-04-09 NOTE — Procedures (Signed)
Routine EEG Report  Grant Jensen. is a 89 y.o. male with a history of spells and altered mental status who is undergoing an EEG to evaluate for seizures.  Report: This EEG was acquired with electrodes placed according to the International 10-20 electrode system (including Fp1, Fp2, F3, F4, C3, C4, P3, P4, O1, O2, T3, T4, T5, T6, A1, A2, Fz, Cz, Pz). The following electrodes were missing or displaced: none.  The occipital dominant rhythm was 6-8 Hz. This activity is reactive to stimulation. Drowsiness was manifested by background fragmentation; deeper stages of sleep were not identified. There was no focal slowing. There were no interictal epileptiform discharges. There were no electrographic seizures identified. Hyperventilation and photic stimulation were not performed.   Impression and clinical correlation: This EEG was obtained while awake and drowsy and is abnormal due to mild diffuse slowing indicative of global cerebral dysfunction. Epileptiform abnormalities were not seen during this recording.  Su Monks, MD [facility] [phone]  If 7pm- 7am, please page neurology on call as listed in [place].

## 2022-04-09 NOTE — H&P (Addendum)
History and Physical   Grant Jensen. ZOX:[id number] DOB: Jun 06, 1928 DOA: 04/09/2022  PCP: [name], MD   Patient coming from: Home  Chief Complaint: Left-sided neurologic deficits  HPI: Grant Jensen. is a 89 y.o. male with medical history significant of hypertension, diabetes, dementia, nonischemic cardiomyopathy, glaucoma presenting with left-sided neurologic deficits.  History obtained with assistance of chart review and family.  Patient was last normal before going to bed at 8 PM last night.  Woke up around 7 AM and is having trouble getting out of bed.  Wife noticed that he was leaning to the left and appeared weaker on his left side.  She additionally noted left facial droop and some slurred speech.  She called EMS who transported patient to the ED for further evaluation.  She has additionally reported some shaking episodes this morning but remains alert during the episodes and responsive.  He does have dementia and at his baseline he knows his name but he he can have difficulty with remembering places or date.  ROS limited by dementia, but does not report any acute issues per family.  ED Course: Vital signs in the ED significant for blood pressure in the 100s to 120 systolic.  Lab workup included CMP with creatinine of 1.64 near baseline at 1.4-1.5, glucose 162.  CBC with hemoglobin of 11.3 which is stable.  PT, PTT, INR within normal limits.  Respiratory panel for flu COVID RSV pending.  Ethanol level negative.  Urinalysis and UDS pending.  A1c and lipid panel pending as well.  Chest x-ray showed no acute abnormality.  CT head showed no acute abnormality.  CTA of the head and neck showed 1 proximal right M2 branch with severe stenosis, moderate stenosis of the right PCA P2, moderate stenosis of the left vertebral origin, moderate spinal canal stenosis at the craniocervical junction.  Patient received aspirin, Plavix, Ativan in the ED.  Neurology consulted and recommended stroke  workup.  Review of Systems: As per HPI otherwise all other systems reviewed and are negative.  Past Medical History:  Diagnosis Date   Diabetes mellitus without complication (HCC)    Glaucoma     Past Surgical History:  Procedure Laterality Date   LEFT HEART CATHETERIZATION WITH CORONARY ANGIOGRAM N/A 06/22/2014   Procedure: LEFT HEART CATHETERIZATION WITH CORONARY ANGIOGRAM;  Surgeon: Yates Decamp, MD;  Location: [facility] CATH LAB;  Service: Cardiovascular;  Laterality: N/A;    Social History  reports that he has never smoked. He does not have any smokeless tobacco history on file. He reports current alcohol use. No history on file for drug use.  Allergies  Allergen Reactions   Lisinopril Cough   Penicillin G Hives and Rash    No family history on file.   Prior to Admission medications   Medication Sig Start Date End Date Taking? Authorizing Provider  acetaminophen (TYLENOL) 325 MG tablet Take 2 tablets (650 mg total) by mouth every 6 (six) hours as needed for moderate pain. 05/04/20  Yes Arrien, York Ram, MD  albuterol (VENTOLIN HFA) 108 (90 Base) MCG/ACT inhaler Inhale 2 puffs into the lungs every 4 (four) hours as needed for wheezing or shortness of breath. 12/06/21  Yes [provider]  aspirin EC 81 MG tablet Take 81 mg by mouth daily.   Yes [provider]  donepezil (ARICEPT) 5 MG tablet Take 5 mg by mouth at bedtime. 03/17/22  Yes [provider]  glimepiride (AMARYL) 1 MG tablet Take 1 mg by  mouth every morning. 03/08/22  Yes [provider]  hydrOXYzine (ATARAX) 25 MG tablet Take 1 at night as needed for sleep Patient taking differently: Take 10 mg by mouth at bedtime as needed (sleep). Take 1 at night as needed for sleep 08/25/21  Yes Milton Ferguson, MD  ipratropium-albuterol (DUONEB) 0.5-2.5 (3) MG/3ML SOLN Take 3 mLs by nebulization every 6 (six) hours as needed (SOB). 12/09/21  Yes [provider]  latanoprost (XALATAN) 0.005 %  ophthalmic solution Place 1 drop into both eyes at bedtime. 10/19/21  Yes [provider]  losartan (COZAAR) 25 MG tablet Take 25 mg by mouth daily. 03/17/22  Yes [provider]  metFORMIN (GLUCOPHAGE) 500 MG tablet Take 1 tablet (500 mg total) by mouth daily with breakfast. Patient taking differently: Take 500 mg by mouth 2 (two) times daily. 06/23/14  Yes Adrian Prows, MD  metoprolol succinate (TOPROL-XL) 25 MG 24 hr tablet Take 25 mg by mouth daily. 03/18/22  Yes [provider]  omeprazole (PRILOSEC) 20 MG capsule Take 20 mg by mouth every other day.   Yes [provider]  isosorbide mononitrate (IMDUR) 30 MG 24 hr tablet Take 30 mg by mouth daily. Patient not taking: Reported on 04/09/2022    [provider]  predniSONE (DELTASONE) 50 MG tablet Take 1 tablet (50 mg total) by mouth daily. Patient not taking: Reported on 06/06/5954 38/75/64   Delora Fuel, MD    Physical Exam: Vitals:   04/09/22 1203 04/09/22 1245  BP:  123/68  Pulse:  81  Resp:  19  SpO2: 100% 100%    Physical Exam Constitutional:      General: He is not in acute distress.    Appearance: Normal appearance.  HENT:     Head: Normocephalic and atraumatic.     Mouth/Throat:     Mouth: Mucous membranes are moist.     Pharynx: Oropharynx is clear.  Eyes:     Extraocular Movements: Extraocular movements intact.     Pupils: Pupils are equal, round, and reactive to light.  Cardiovascular:     Rate and Rhythm: Normal rate and regular rhythm.     Pulses: Normal pulses.     Heart sounds: Normal heart sounds.  Pulmonary:     Effort: Pulmonary effort is normal. No respiratory distress.     Breath sounds: Normal breath sounds.  Abdominal:     General: Bowel sounds are normal. There is no distension.     Palpations: Abdomen is soft.     Tenderness: There is no abdominal tenderness.  Musculoskeletal:        General: No swelling or deformity.  Skin:    General: Skin is warm and  dry.  Neurological:     Comments: Mental Status: Patient is awake, alert, oriented to person only. No signs of aphasia or neglect Cranial Nerves: II: Known history of glaucoma and blindness III,IV, VI: Not assessed V: Facial sensation is symmetric to light touch. VII: Left facial droop VIII: hearing is intact to voice X: Uvula elevates symmetrically XI: Shoulder shrug is symmetric. XII: tongue is midline without atrophy or fasciculations.  Motor: Decent effort thorughout, at Least 5/5 RUE, 5/5 RLE, 4/5 LUE, 4/5 LLE.  Sensory: Sensation is grossly intact bilateral UEs & LEs    Labs on Admission: I have personally reviewed following labs and imaging studies  CBC: Recent Labs  Lab 04/09/22 1210 04/09/22 1230  WBC 7.5  --   NEUTROABS 2.4  --   HGB  11.3* 13.3  HCT 37.0* 39.0  MCV 79.7*  --   PLT 171  --     Basic Metabolic Panel: Recent Labs  Lab 04/09/22 1210 04/09/22 1230  NA 135 137  K 4.0 4.0  CL 104 104  CO2 20*  --   GLUCOSE 162* 160*  BUN 15 18  CREATININE 1.64* 1.70*  CALCIUM 9.0  --     GFR: CrCl cannot be calculated (Unknown ideal weight.).  Liver Function Tests: Recent Labs  Lab 04/09/22 1210  AST 32  ALT 16  ALKPHOS 57  BILITOT 0.3  PROT 6.6  ALBUMIN 3.9    Urine analysis:    Component Value Date/Time   COLORURINE STRAW (A) 04/30/2021 0346   APPEARANCEUR CLEAR 04/30/2021 0346   LABSPEC 1.010 04/30/2021 0346   PHURINE 7.0 04/30/2021 0346   GLUCOSEU NEGATIVE 04/30/2021 0346   HGBUR NEGATIVE 04/30/2021 0346   BILIRUBINUR NEGATIVE 04/30/2021 0346   KETONESUR NEGATIVE 04/30/2021 0346   PROTEINUR NEGATIVE 04/30/2021 0346   NITRITE NEGATIVE 04/30/2021 0346   LEUKOCYTESUR NEGATIVE 04/30/2021 0346    Radiological Exams on Admission: DG Chest Portable 1 View  Result Date: 04/09/2022 CLINICAL DATA:  Rule out pneumonia. Right years. Slurred speech and mild left arm pain and left leg weakness. EXAM: PORTABLE CHEST 1 VIEW COMPARISON:   12/06/2021 FINDINGS: Stable cardiomediastinal contours. No pleural effusion or edema. Chronic bronchitic changes are identified bilaterally. No superimposed airspace consolidation. Visualized osseous structures are unremarkable. IMPRESSION: 1. No acute findings. 2. Chronic bronchitic changes. Electronically Signed   By: Signa Kell M.D.   On: 04/09/2022 13:17   CT ANGIO HEAD NECK W WO CM (CODE STROKE)  Result Date: 04/09/2022 CLINICAL DATA:  Slurred speech.  Left-sided weakness. EXAM: CT ANGIOGRAPHY HEAD AND NECK TECHNIQUE: Multidetector CT imaging of the head and neck was performed using the standard protocol during bolus administration of intravenous contrast. Multiplanar CT image reconstructions and MIPs were obtained to evaluate the vascular anatomy. Carotid stenosis measurements (when applicable) are obtained utilizing NASCET criteria, using the distal internal carotid diameter as the denominator. RADIATION DOSE REDUCTION: This exam was performed according to the departmental dose-optimization program which includes automated exposure control, adjustment of the mA and/or kV according to patient size and/or use of iterative reconstruction technique. CONTRAST:  75 ml Omni 350 COMPARISON:  CT Head 04/30/21 FINDINGS: CT HEAD FINDINGS Brain: No evidence of acute infarction, hemorrhage, hydrocephalus, extra-axial collection or mass lesion/mass effect. Is sequela of severe chronic microvascular ischemic change with advanced generalized volume loss. No evidence of hydrocephalus. Vascular: No hyperdense vessel or unexpected calcification. Skull: Normal. Negative for fracture or focal lesion. Sinuses/Orbits: No mastoid or middle ear effusion. Paranasal sinuses are clear. Bilateral lens replacement. Orbits are otherwise unremarkable. Other: None. Review of the MIP images confirms the above findings CTA NECK FINDINGS Aortic arch: Standard branching. Imaged portion shows no evidence of aneurysm or dissection. No  significant stenosis of the major arch vessel origins. Right carotid system: No evidence of dissection, stenosis (50% or greater), or occlusion. Mild atherosclerotic calcification at the carotid bifurcation Left carotid system: No evidence of dissection, stenosis (50% or greater), or occlusion. Vertebral arteries: Moderate stenosis of the origin of the left vertebral artery secondary to calcified atherosclerotic plaque. Skeleton: There are degenerative changes at C1-C2 articulation where there is a large pannus and ligamentum flavum hypertrophy. This results in moderate spinal canal stenosis at the craniocervical junction. Other neck: Negative. Upper chest: Biapical bronchial wall thickening as can be seen  in the setting of bronchitis or small airways disease. Review of the MIP images confirms the above findings CTA HEAD FINDINGS Anterior circulation: 1 of the proximal M2 branches on the right is likely occluded (series 7, image 99). ) Posterior circulation: Moderate focal stenosis in the P2 segment of the right PCA (series 11, image 77). Venous sinuses: As permitted by contrast timing, patent. Anatomic variants: None Review of the MIP images confirms the above findings IMPRESSION: 1. No hemorrhage or CT evidence of an acute infarct.  Aspects is 10. 2. One of the proximal M2 branches on the right is severely stenosed to nearly occluded. 3. Moderate focal stenosis in the P2 segment of the right PCA. 4. Moderate stenosis of the origin of the left vertebral artery. 5. Moderate spinal canal stenosis at the craniocervical junction due to ligamentum falvum hypertrophy and pannus. Findings were paged to Dr. Lorrin Goodell on 04/09/22 at 12:50 PM via [facility] paging system. Electronically Signed   By: Marin Roberts M.D.   On: 04/09/2022 12:57   CT HEAD CODE STROKE WO CONTRAST`  Result Date: 04/09/2022 CLINICAL DATA:  Slurred speech.  Left-sided weakness. EXAM: CT ANGIOGRAPHY HEAD AND NECK TECHNIQUE: Multidetector CT imaging of  the head and neck was performed using the standard protocol during bolus administration of intravenous contrast. Multiplanar CT image reconstructions and MIPs were obtained to evaluate the vascular anatomy. Carotid stenosis measurements (when applicable) are obtained utilizing NASCET criteria, using the distal internal carotid diameter as the denominator. RADIATION DOSE REDUCTION: This exam was performed according to the departmental dose-optimization program which includes automated exposure control, adjustment of the mA and/or kV according to patient size and/or use of iterative reconstruction technique. CONTRAST:  75 ml Omni 350 COMPARISON:  CT Head 04/30/21 FINDINGS: CT HEAD FINDINGS Brain: No evidence of acute infarction, hemorrhage, hydrocephalus, extra-axial collection or mass lesion/mass effect. Is sequela of severe chronic microvascular ischemic change with advanced generalized volume loss. No evidence of hydrocephalus. Vascular: No hyperdense vessel or unexpected calcification. Skull: Normal. Negative for fracture or focal lesion. Sinuses/Orbits: No mastoid or middle ear effusion. Paranasal sinuses are clear. Bilateral lens replacement. Orbits are otherwise unremarkable. Other: None. Review of the MIP images confirms the above findings CTA NECK FINDINGS Aortic arch: Standard branching. Imaged portion shows no evidence of aneurysm or dissection. No significant stenosis of the major arch vessel origins. Right carotid system: No evidence of dissection, stenosis (50% or greater), or occlusion. Mild atherosclerotic calcification at the carotid bifurcation Left carotid system: No evidence of dissection, stenosis (50% or greater), or occlusion. Vertebral arteries: Moderate stenosis of the origin of the left vertebral artery secondary to calcified atherosclerotic plaque. Skeleton: There are degenerative changes at C1-C2 articulation where there is a large pannus and ligamentum flavum hypertrophy. This results in  moderate spinal canal stenosis at the craniocervical junction. Other neck: Negative. Upper chest: Biapical bronchial wall thickening as can be seen in the setting of bronchitis or small airways disease. Review of the MIP images confirms the above findings CTA HEAD FINDINGS Anterior circulation: 1 of the proximal M2 branches on the right is likely occluded (series 7, image 99). ) Posterior circulation: Moderate focal stenosis in the P2 segment of the right PCA (series 11, image 77). Venous sinuses: As permitted by contrast timing, patent. Anatomic variants: None Review of the MIP images confirms the above findings IMPRESSION: 1. No hemorrhage or CT evidence of an acute infarct.  Aspects is 10. 2. One of the proximal M2 branches on the right  is severely stenosed to nearly occluded. 3. Moderate focal stenosis in the P2 segment of the right PCA. 4. Moderate stenosis of the origin of the left vertebral artery. 5. Moderate spinal canal stenosis at the craniocervical junction due to ligamentum falvum hypertrophy and pannus. Findings were paged to Dr. Lorrin Goodell on 04/09/22 at 12:50 PM via [facility] paging system. Electronically Signed   By: Marin Roberts M.D.   On: 04/09/2022 12:57    EKG: Independently reviewed.  Sinus rhythm at 81 bpm.  Right bundle blanch block and read as left anterior fascicular block.  Evidence of possible LVH.  QTc borderline at 500.  Wide QRS.  Assessment/Plan Principal Problem:   Acute CVA (cerebrovascular accident) [facility]) Active Problems:   Dementia ([place])   Diabetes ([place])   Essential hypertension   Acute CVA > Patient presenting with symptoms consistent with acute stroke.  Last normal 8 PM last night and at 7 AM this morning woke up was having difficulty getting out of bed.  As per HPI, wife noted left-sided deficits with weakness, facial droop and slurred speech. > In the ED chest x-ray and CT head normal.  CT of the head and neck did show some significant/severe stenosis. > Check  patient will benefit from rehab possibly inpatient rehab given that at baseline he walks with a walker but does need assistance for family to do this; and now has left-sided deficits complicating this baseline weakness. > Neurology consulted and recommending stroke workup - Appreciate neurology recommendations - Allow for permissive HTN (systolic < 161 and diastolic < 096) - ASA and plavix for now per neruo - Start statin, pending Lipid panel results - Echocardiogram  - A1C  - Lipid panel  - Tele monitoring  - SLP eval - PT/OT  Hypertension - Holding home antihypertensives in the setting of permissive hypertension  Diabetes - SSI  Dementia - Continue home donepezil - PRN Atarax for sleep  Glaucoma Blindness > Declines to take eyedrops at home per family - Noted  DVT prophylaxis: SCDs for now Code Status:   DNR/DNI, confirmed on admission Family Communication:  Updated at bedside Disposition Plan:   Patient is from:  Home  Anticipated DC to:  Home  Anticipated DC date:  To 2 days  Anticipated DC barriers: None  Consults called:  Neurology Admission status:  Observation, telemetry  Severity of Illness: The appropriate patient status for this patient is INPATIENT. Inpatient status is judged to be reasonable and necessary in order to provide the required intensity of service to ensure the patient's safety. The patient's presenting symptoms, physical exam findings, and initial radiographic and laboratory data in the context of their chronic comorbidities is felt to place them at high risk for further clinical deterioration. Furthermore, it is not anticipated that the patient will be medically stable for discharge from the hospital within 2 midnights of admission.   * I certify that at the point of admission it is my clinical judgment that the patient will require inpatient hospital care spanning beyond 2 midnights from the point of admission due to high intensity of service, high  risk for further deterioration and high frequency of surveillance required.Marcelyn Bruins MD [facility]  How to contact the [facility] Attending or Consulting provider [place] or covering provider during after hours [place], for this patient?   Check the care team in [facility] and look for a) attending/consulting [facility] provider listed and b) the [facility] team listed Log into [email or web address]  and use [place] universal password to access. If you do not have the password, please contact the hospital operator. Locate the [facility] provider you are looking for under [facility] and page to a number that you can be directly reached. If you still have difficulty reaching the provider, please page the [facility] (Director on Call) for the Hospitalists listed on amion for assistance.  04/09/2022, 2:06 PM

## 2022-04-10 ENCOUNTER — Encounter (HOSPITAL_COMMUNITY): Payer: Self-pay | Admitting: Internal Medicine

## 2022-04-10 ENCOUNTER — Other Ambulatory Visit: Payer: Self-pay

## 2022-04-10 ENCOUNTER — Inpatient Hospital Stay (HOSPITAL_COMMUNITY): Payer: Medicare Other

## 2022-04-10 DIAGNOSIS — I6389 Other cerebral infarction: Secondary | ICD-10-CM

## 2022-04-10 DIAGNOSIS — I639 Cerebral infarction, unspecified: Secondary | ICD-10-CM | POA: Diagnosis not present

## 2022-04-10 LAB — CBC
HCT: 32.7 % — ABNORMAL LOW (ref 39.0–52.0)
Hemoglobin: 10.6 g/dL — ABNORMAL LOW (ref 13.0–17.0)
MCH: 25 pg — ABNORMAL LOW (ref 26.0–34.0)
MCHC: 32.4 g/dL (ref 30.0–36.0)
MCV: 77.1 fL — ABNORMAL LOW (ref 80.0–100.0)
Platelets: 197 10*3/uL (ref 150–400)
RBC: 4.24 MIL/uL (ref 4.22–5.81)
RDW: 14 % (ref 11.5–15.5)
WBC: 6.1 10*3/uL (ref 4.0–10.5)
nRBC: 0 % (ref 0.0–0.2)

## 2022-04-10 LAB — LIPID PANEL
Cholesterol: 152 mg/dL (ref 0–200)
HDL: 35 mg/dL — ABNORMAL LOW (ref >40)
LDL Cholesterol: 90 mg/dL (ref 0–99)
Total CHOL/HDL Ratio: 4.3 RATIO
Triglycerides: 137 mg/dL (ref <150)
VLDL: 27 mg/dL (ref 0–40)

## 2022-04-10 LAB — COMPREHENSIVE METABOLIC PANEL
ALT: 14 U/L (ref 0–44)
AST: 24 U/L (ref 15–41)
Albumin: 3.4 g/dL — ABNORMAL LOW (ref 3.5–5.0)
Alkaline Phosphatase: 46 U/L (ref 38–126)
Anion gap: 12 (ref 5–15)
BUN: 13 mg/dL (ref 8–23)
CO2: 20 mmol/L — ABNORMAL LOW (ref 22–32)
Calcium: 8.7 mg/dL — ABNORMAL LOW (ref 8.9–10.3)
Chloride: 104 mmol/L (ref 98–111)
Creatinine, Ser: 1.5 mg/dL — ABNORMAL HIGH (ref 0.61–1.24)
GFR, Estimated: 44 mL/min — ABNORMAL LOW (ref >60)
Glucose, Bld: 82 mg/dL (ref 70–99)
Potassium: 4.3 mmol/L (ref 3.5–5.1)
Sodium: 136 mmol/L (ref 135–145)
Total Bilirubin: 0.7 mg/dL (ref 0.3–1.2)
Total Protein: 5.9 g/dL — ABNORMAL LOW (ref 6.5–8.1)

## 2022-04-10 LAB — ECHOCARDIOGRAM COMPLETE
AR max vel: 2.23 cm2
AV Area VTI: 2.12 cm2
AV Area mean vel: 2.14 cm2
AV Mean grad: 8.5 mmHg
AV Peak grad: 15.1 mmHg
Ao pk vel: 1.94 m/s
Area-P 1/2: 3.08 cm2
S' Lateral: 2.5 cm

## 2022-04-10 LAB — GLUCOSE, CAPILLARY
Glucose-Capillary: 103 mg/dL — ABNORMAL HIGH (ref 70–99)
Glucose-Capillary: 90 mg/dL (ref 70–99)
Glucose-Capillary: 117 mg/dL — ABNORMAL HIGH (ref 70–99)
Glucose-Capillary: 126 mg/dL — ABNORMAL HIGH (ref 70–99)
Glucose-Capillary: 183 mg/dL — ABNORMAL HIGH (ref 70–99)

## 2022-04-10 LAB — HEMOGLOBIN A1C
Hgb A1c MFr Bld: 10.1 % — ABNORMAL HIGH (ref 4.8–5.6)
Mean Plasma Glucose: 243.17 mg/dL

## 2022-04-10 MED ORDER — ASPIRIN 81 MG PO TBEC
81.0000 mg | DELAYED_RELEASE_TABLET | Freq: Every day | ORAL | Status: DC
  Administered 2022-04-10 – 2022-04-14 (×5): 81 mg via ORAL
  Filled 2022-04-10 (×5): qty 1

## 2022-04-10 MED ORDER — ROSUVASTATIN CALCIUM 20 MG PO TABS
20.0000 mg | ORAL_TABLET | Freq: Every day | ORAL | Status: DC
  Administered 2022-04-10 – 2022-04-14 (×5): 20 mg via ORAL
  Filled 2022-04-10 (×5): qty 1

## 2022-04-10 MED ORDER — CLOPIDOGREL BISULFATE 75 MG PO TABS
75.0000 mg | ORAL_TABLET | Freq: Every day | ORAL | Status: DC
  Administered 2022-04-10 – 2022-04-14 (×5): 75 mg via ORAL
  Filled 2022-04-10 (×5): qty 1

## 2022-04-10 NOTE — Progress Notes (Signed)
  Transition of Care [facility]) Screening Note   Patient Details  Name: Grant Jensen. Date of Birth: Apr 05, 1928   [facility] CM/SW Contact:    Pollie Friar, RN Phone Number: 04/10/2022, 4:00 PM   Pt is from home with spouse. CIR to work up for potential admission. Transition of Care Department [facility] has reviewed patient. We will continue to monitor patient advancement through interdisciplinary progression rounds. If new patient transition needs arise, please place a TOC consult.

## 2022-04-10 NOTE — Progress Notes (Signed)
  Progress Note   Patient: Grant Jensen. IEP:[id number] DOB: May 25, 1928 DOA: 04/09/2022     1 DOS: the patient was seen and examined on 04/10/2022   Brief hospital course:  Assessment and Plan: Acute CVA  - ASA 81 mg PO daily  - Plavix 75 mg PO daily  - ECHO pending  - Disposition per PT/OT is to get the pt to inpt rehab  HTN  - Monitor, BP's stable   DM - Novolog SS tid w/ meals   Dementia - Aricept 5 mg PO daily  - Atarax 10 mg PO PRN   Glaucoma - Per documentation, pt declines to take eye drops   DVT prophylaxis: SCDs     Subjective: Pt seen and examined at the bedside. He continues on ASA and plavix. ECHO pending. PT/OT have advised acute inpatient rehab.  Physical Exam: Vitals:   04/09/22 1245 04/09/22 1521 04/09/22 2346 04/10/22 0754  BP: 123/68  (!) 107/51 135/64  Pulse: 81  72 72  Resp: 19  14   Temp:  97.7 F (36.5 C) 98.3 F (36.8 C) 97.8 F (36.6 C)  TempSrc:  Oral  Oral  SpO2: 100%  100% 100%   Physical Exam Constitutional:      Appearance: Normal appearance.  HENT:     Head: Normocephalic.     Mouth/Throat:     Mouth: Mucous membranes are moist.  Cardiovascular:     Rate and Rhythm: Normal rate and regular rhythm.  Pulmonary:     Effort: Pulmonary effort is normal.  Abdominal:     General: Abdomen is flat.     Palpations: Abdomen is soft.  Skin:    General: Skin is warm.  Neurological:     Mental Status: He is alert. Mental status is at baseline.  Psychiatric:        Mood and Affect: Mood normal.    Data Reviewed:   Disposition: Status is: Inpatient  Planned Discharge Destination: Rehab    Time spent: 35 minutes  Author: Lucienne Minks , MD 04/10/2022 11:09 AM  For on call review [email or web address].

## 2022-04-10 NOTE — Evaluation (Signed)
Occupational Therapy Evaluation Patient Details Name: Grant Jensen. MRN: [id number] DOB: August 12, 1928 Today's Date: 04/10/2022   History of Present Illness Pt is a 89 y.o. male who presented 04/09/22 with left sided weakness and dysarthria. Outside window for TNK. CT head shows no acute abnormality. MRI showing small foci of acute infarct in the right periventricular posterior frontal lobe/corona radiata. No hemorrhage or mass effect.   PMH: dementia, DM, blindness due to glaucoma   Clinical Impression   Prior to this admission, patient living with his wife, with wife assisting with all ADLs and IADLs, though patient could participate in dressing. Patient is blind at baseline, and used a RW and the sound of his wife's voice to ambulate through the house. Per wife, patient has not fallen within the last few months. Currently, patient presenting with L sided weakness (more prominent in leg than arm) and need for increased asssit for all aspects of care. Patient is currently max to total A (lower body) for ADL management. Patient in recliner upon OT arrival with need for mod A of 2 to come into standing, with no noted knee buckling, and improved ability to pivot L leg. Patient requiring frequent cues and redirections throughout (though may be baseline due to prior cognitive status). OT recommending AIR placement as patient has 24/7 support, demonstrates increased need for assist in comparison to previous baseline, and to decrease level of caregiver burden.      Recommendations for follow up therapy are one component of a multi-disciplinary discharge planning process, led by the attending physician.  Recommendations may be updated based on patient status, additional functional criteria and insurance authorization.   Follow Up Recommendations  Acute inpatient rehab (3hours/day)     Assistance Recommended at Discharge Frequent or constant Supervision/Assistance  Patient can return home with the  following Two people to help with walking and/or transfers;A lot of help with bathing/dressing/bathroom;Assistance with cooking/housework;Assistance with feeding;Direct supervision/assist for medications management;Direct supervision/assist for financial management;Assist for transportation;Help with stairs or ramp for entrance    Functional Status Assessment  Patient has had a recent decline in their functional status and demonstrates the ability to make significant improvements in function in a reasonable and predictable amount of time.  Equipment Recommendations  Other (comment) (Defer to next venue)    Recommendations for Other Services       Precautions / Restrictions Precautions Precautions: Fall Precaution Comments: blind Restrictions Weight Bearing Restrictions: No      Mobility Bed Mobility Overal bed mobility: Needs Assistance             General bed mobility comments: patient up in recliner upon arrival    Transfers Overall transfer level: Needs assistance Equipment used: 2 person hand held assist Transfers: Sit to/from Stand, Bed to chair/wheelchair/BSC Sit to Stand: Mod assist, +2 safety/equipment, +2 physical assistance Stand pivot transfers: Mod assist, +2 physical assistance, +2 safety/equipment         General transfer comment: Patient benefitting from use of bilateral arm rests, rocking and counting to 3 to come into standing, improved initiation noted for 2 sit<>stands completed, patient requiring frequent cues in order move feet to complete transfer, though improved steps noted with LLE      Balance Overall balance assessment: Needs assistance Sitting-balance support: Single extremity supported, Feet supported Sitting balance-Leahy Scale: Fair     Standing balance support: Bilateral upper extremity supported, During functional activity, Reliant on assistive device for balance Standing balance-Leahy Scale: Poor Standing balance comment: Reliant  on UE support, intermittent blocking of knees needed                           ADL either performed or assessed with clinical judgement   ADL Overall ADL's : Needs assistance/impaired Eating/Feeding: Maximal assistance;Sitting Eating/Feeding Details (indicate cue type and reason): secondary to blindness Grooming: Moderate assistance;Wash/dry hands;Wash/dry face;Sitting   Upper Body Bathing: Moderate assistance;Sitting   Lower Body Bathing: Maximal assistance;Total assistance;Sitting/lateral leans;Sit to/from stand   Upper Body Dressing : Moderate assistance;Sitting   Lower Body Dressing: Maximal assistance;Total assistance;Sitting/lateral leans;Sit to/from stand;+2 for physical assistance Lower Body Dressing Details (indicate cue type and reason): changing brief out in standing, needing assist to thread and to doff and don, unable  to complete peri-care in standing Toilet Transfer: Moderate assistance;+2 for physical assistance;+2 for safety/equipment;Stand-pivot;Cueing for sequencing;Cueing for safety Toilet Transfer Details (indicate cue type and reason): simulated with transfer from recliner to bed, cues to pivot feet but noted increased ability to move L foot Toileting- Clothing Manipulation and Hygiene: Total assistance;+2 for physical assistance;+2 for safety/equipment;Sitting/lateral lean;Sit to/from stand Toileting - Clothing Manipulation Details (indicate cue type and reason): total A for peri care in standing     Functional mobility during ADLs: Moderate assistance;+2 for physical assistance;+2 for safety/equipment;Cueing for safety;Cueing for sequencing General ADL Comments: Patient presenting with L sided weakness (more prominent in leg than arm) and need for increased asssit for all aspects of care     Vision Baseline Vision/History: 2 Legally blind (Since 89) Patient Visual Report: No change from baseline Vision Assessment?: No apparent visual deficits      Perception     Praxis      Pertinent Vitals/Pain Pain Assessment Pain Assessment: No/denies pain Faces Pain Scale: No hurt     Hand Dominance Right   Extremity/Trunk Assessment Upper Extremity Assessment Upper Extremity Assessment: Overall WFL for tasks assessed;Difficult to assess due to impaired cognition (Patient with no weakness in L side when assessed, able to follow commands to complete strength assessment, however will continue to assess)   Lower Extremity Assessment Lower Extremity Assessment: Defer to PT evaluation   Cervical / Trunk Assessment Cervical / Trunk Assessment: Kyphotic   Communication Communication Communication: HOH   Cognition Arousal/Alertness: Awake/alert Behavior During Therapy: WFL for tasks assessed/performed Overall Cognitive Status: History of cognitive impairments - at baseline                                 General Comments: Wife reporting that patient is not at his baseline, stating he usually does not require this much redirection. Patient also calling wife "mama" throughout session, with wife giving impression that he typically knows her by voice     General Comments       Exercises     Shoulder Instructions      Home Living Family/patient expects to be discharged to:: Private residence Living Arrangements: Spouse/significant other Available Help at Discharge: Family;Available 24 hours/day Type of Home: House Home Access: Stairs to enter [employer] of Steps: 3 Entrance Stairs-Rails: Right;Left Home Layout: One level     Bathroom Shower/Tub: Teacher, early years/pre: Standard Bathroom Accessibility: Yes   Home Equipment: Conservation officer, nature (2 wheels);Transport chair;BSC/3in1;Shower seat;Grab bars - tub/shower;Hand held shower head          Prior Functioning/Environment Prior Level of Function : History of Falls (last six months);Needs assist  Mobility Comments: Pt  requires bil HHA or RW for mobility and guidance for all standing mobility due to his blindness. Able to ambulate household distances and navigate stairs with guidance. Hx of falls. ADLs Comments: Wife assists with all ADLs due to blindness and dementia. Pt able to assist in dressing.        OT Problem List: Decreased strength;Decreased range of motion;Decreased activity tolerance;Impaired balance (sitting and/or standing);Decreased coordination;Decreased cognition;Decreased safety awareness;Decreased knowledge of use of DME or AE;Decreased knowledge of precautions      OT Treatment/Interventions: Self-care/ADL training;Therapeutic exercise;Neuromuscular education;Energy conservation;DME and/or AE instruction;Manual therapy;Therapeutic activities;Cognitive remediation/compensation;Patient/family education;Balance training    OT Goals(Current goals can be found in the care plan section) Acute Rehab OT Goals Patient Stated Goal: per wife for patient to improve to take home OT Goal Formulation: With patient/family Time For Goal Achievement: 04/24/22 Potential to Achieve Goals: Good ADL Goals Pt Will Perform Lower Body Bathing: with min assist;sit to/from stand;sitting/lateral leans Pt Will Perform Lower Body Dressing: with min assist;sit to/from stand;sitting/lateral leans Pt Will Transfer to Toilet: with min assist;stand pivot transfer;bedside commode Pt Will Perform Toileting - Clothing Manipulation and hygiene: with min assist;sitting/lateral leans;sit to/from stand Additional ADL Goal #1: Patient will be able to complete functional task in standing for 1-2 minutes (without need for seated rest break) to return to prior level of function and reduce caregiver burden.  OT Frequency: Min 2X/week    Co-evaluation              AM-PAC OT "6 Clicks" Daily Activity     Outcome Measure Help from another person eating meals?: A Lot Help from another person taking care of personal grooming?: A  Little Help from another person toileting, which includes using toliet, bedpan, or urinal?: Total Help from another person bathing (including washing, rinsing, drying)?: A Lot Help from another person to put on and taking off regular upper body clothing?: A Lot Help from another person to put on and taking off regular lower body clothing?: Total 6 Click Score: 11   End of Session Equipment Utilized During Treatment: Gait belt Nurse Communication: Mobility status  Activity Tolerance: Patient tolerated treatment well Patient left: in bed;with call bell/phone within reach;with bed alarm set;with family/visitor present (in chair position)  OT Visit Diagnosis: Unsteadiness on feet (R26.81);Other abnormalities of gait and mobility (R26.89);Repeated falls (R29.6);Muscle weakness (generalized) (M62.81);History of falling (Z91.81);Other symptoms and signs involving cognitive function;Hemiplegia and hemiparesis Hemiplegia - Right/Left: Left Hemiplegia - dominant/non-dominant: Non-Dominant Hemiplegia - caused by: Cerebral infarction                Time: 0254-2706 OT Time Calculation (min): 21 min Charges:  OT General Charges $OT Visit: 1 Visit OT Evaluation $OT Eval Moderate Complexity: 1 Mod  Corinne Ports E. Dung Salinger, OTR/L Acute Rehabilitation Services [phone]   Ascencion Dike 04/10/2022, 12:51 PM

## 2022-04-10 NOTE — Progress Notes (Addendum)
Physical Therapy Treatment Patient Details Name: Grant Jensen. MRN: [id number] DOB: [birth year] Today's Date: 04/10/2022   History of Present Illness Pt is a 89 y.o. male who presented 04/09/22 with left sided weakness and dysarthria. Outside window for TNK. CT head shows no acute abnormality. Awaiting brain MRI. PMH: dementia, DM, blindness due to glaucoma    PT Comments    Pt progressing towards his physical therapy goals, demonstrates improvement in static sitting balance and tranfers. Requires increased verbal/tactile cueing due to chronic visual impairment. Pt requiring two person moderate assist for bed mobility and squat pivot transfers. Demonstrates BLE weakness (L>R), impaired balance, cognitive deficits (seems to be close to baseline). Has strong family support. Will benefit from AIR to address deficits and maximize functional independence, in order to decrease caregiver burden.    Recommendations for follow up therapy are one component of a multi-disciplinary discharge planning process, led by the attending physician.  Recommendations may be updated based on patient status, additional functional criteria and insurance authorization.  Follow Up Recommendations  Acute inpatient rehab (3hours/day)     Assistance Recommended at Discharge Frequent or constant Supervision/Assistance  Patient can return home with the following Two people to help with walking and/or transfers;A lot of help with bathing/dressing/bathroom;Assistance with cooking/housework;Direct supervision/assist for financial management;Direct supervision/assist for medications management;Assist for transportation;Help with stairs or ramp for entrance   Equipment Recommendations  Hospital bed;Other (comment) (hoyer lift and pad-pending progress)    Recommendations for Other Services Rehab consult     Precautions / Restrictions Precautions Precautions: Fall;Other (comment) Precaution Comments:  blind Restrictions Weight Bearing Restrictions: No     Mobility  Bed Mobility Overal bed mobility: Needs Assistance Bed Mobility: Supine to Sit     Supine to sit: Mod assist, +2 for physical assistance     General bed mobility comments: Tactileauditory cues for guidance, pt initiating bringing BLE's off edge of bed, assist for trunk and use of bed pad to pivot hips    Transfers Overall transfer level: Needs assistance Equipment used: 2 person hand held assist Transfers: Sit to/from Stand, Bed to chair/wheelchair/BSC Sit to Stand: Mod assist, +2 physical assistance     Squat pivot transfers: Mod assist, +2 physical assistance     General transfer comment: ModA + 2 for squat pivot towards right from bed to chair, bilateral knee block, assist at hips for guidance. Additional stand from chair; pt only able to achieve partially upright, bilateral anterior foot slide (L> R), fearful of falling?    Ambulation/Gait               General Gait Details: unable   Stairs             Wheelchair Mobility    Modified Rankin (Stroke Patients Only) Modified Rankin (Stroke Patients Only) Pre-Morbid Rankin Score: Moderately severe disability Modified Rankin: Severe disability     Balance Overall balance assessment: Needs assistance Sitting-balance support: Single extremity supported, Feet supported Sitting balance-Leahy Scale: Fair     Standing balance support: Bilateral upper extremity supported, During functional activity, Reliant on assistive device for balance Standing balance-Leahy Scale: Poor Standing balance comment: Reliant on UE support, bil knee block,                            Cognition Arousal/Alertness: Awake/alert Behavior During Therapy: WFL for tasks assessed/performed Overall Cognitive Status: History of cognitive impairments - at baseline  General Comments: Wife reports he seems at his  baseline currently. Pt has dementia and asks questions frequently and fidgets with things often.        Exercises General Exercises - Upper Extremity Shoulder Flexion: AAROM, Both, 10 reps, Seated General Exercises - Lower Extremity Ankle Circles/Pumps: AAROM, Both, 10 reps, Seated Long Arc Quad: AAROM, Both, 10 reps, Seated    General Comments        Pertinent Vitals/Pain Pain Assessment Pain Assessment: No/denies pain    Home Living                          Prior Function            PT Goals (current goals can now be found in the care plan section) Acute Rehab PT Goals Patient Stated Goal: did not state; wife hopeful for pt to improve PT Goal Formulation: With patient/family Time For Goal Achievement: 04/23/22 Potential to Achieve Goals: Fair Progress towards PT goals: Progressing toward goals    Frequency    Min 3X/week      PT Plan Frequency needs to be updated    Co-evaluation              AM-PAC PT "6 Clicks" Mobility   Outcome Measure  Help needed turning from your back to your side while in a flat bed without using bedrails?: A Lot Help needed moving from lying on your back to sitting on the side of a flat bed without using bedrails?: A Lot Help needed moving to and from a bed to a chair (including a wheelchair)?: A Lot Help needed standing up from a chair using your arms (e.g., wheelchair or bedside chair)?: A Lot Help needed to walk in hospital room?: Total Help needed climbing 3-5 steps with a railing? : Total 6 Click Score: 10    End of Session Equipment Utilized During Treatment: Gait belt Activity Tolerance: Patient tolerated treatment well Patient left: with call bell/phone within reach;in chair;with chair alarm set;with family/visitor present Nurse Communication: Mobility status PT Visit Diagnosis: Unsteadiness on feet (R26.81);Other abnormalities of gait and mobility (R26.89);Repeated falls (R29.6);Muscle weakness  (generalized) (M62.81);History of falling (Z91.81);Difficulty in walking, not elsewhere classified (R26.2);Other symptoms and signs involving the nervous system (P53.614)     Time: 4315-4008 PT Time Calculation (min) (ACUTE ONLY): 18 min  Charges:  $Therapeutic Activity: 8-22 mins                     Wyona Almas, PT, DPT Acute Rehabilitation Services Office [place] 04/10/2022, 10:22 AM

## 2022-04-10 NOTE — Progress Notes (Signed)
Echocardiogram 2D Echocardiogram has been performed.  Grant Jensen 04/10/2022, 4:16 PM

## 2022-04-10 NOTE — Plan of Care (Signed)
  Problem: Education: Goal: Knowledge of disease or condition will improve Outcome: Progressing   Problem: Education: Goal: Knowledge of secondary prevention will improve (MUST DOCUMENT ALL) Outcome: Progressing   Problem: Ischemic Stroke/TIA Tissue Perfusion: Goal: Complications of ischemic stroke/TIA will be minimized Outcome: Progressing   Problem: Self-Care: Goal: Ability to participate in self-care as condition permits will improve Outcome: Progressing   Problem: Nutrition: Goal: Risk of aspiration will decrease Outcome: Progressing

## 2022-04-10 NOTE — Progress Notes (Signed)
Inpatient Rehab Admissions Coordinator:  ? ?Per therapy recommendations,  patient was screened for CIR candidacy by Babygirl Trager, MS, CCC-SLP. At this time, Pt. Appears to be a a potential candidate for CIR. I will place   order for rehab consult per protocol for full assessment. Please contact me any with questions. ? ?Jendayi Berling, MS, CCC-SLP ?Rehab Admissions Coordinator  ?[phone] (celll) ?[phone] (office) ? ?

## 2022-04-10 NOTE — Inpatient Diabetes Management (Signed)
Inpatient Diabetes Program Recommendations  AACE/ADA: New Consensus Statement on Inpatient Glycemic Control (2015)  Target Ranges:  Prepandial:   less than 140 mg/dL      Peak postprandial:   less than 180 mg/dL (1-2 hours)      Critically ill patients:  140 - 180 mg/dL   Lab Results  Component Value Date   GLUCAP 117 (H) 04/10/2022   HGBA1C 10.1 (H) 04/10/2022    Review of Glycemic Control  Latest Reference Range & Units 04/09/22 12:20 04/09/22 17:48 04/09/22 20:22 04/09/22 23:46 04/10/22 03:42 04/10/22 07:38 04/10/22 12:12  Glucose-Capillary 70 - 99 mg/dL 176 (H) 77 140 (H) 124 (H) 103 (H) 90 117 (H)  (H): Data is abnormally high  Diabetes history: DM2 Outpatient Diabetes medications: Novolog 0-9 units TID Current orders for Inpatient glycemic control: Amaryl 1 mg QD, Metformin 500 mg QD  Spoke with spouse, [name] at bedside.  Reviewed patient's current A1c of 10.1%. Explained what a A1c is and what it measures. Also reviewed goal A1c with patient, importance of good glucose control @ home, and blood sugar goals.  She states his A1C was 12% 2 weeks ago.  He has Dementia and she is his primary caregiver.  He attends an adult daycare during the day.  She states he does not drink caloric beverages and she checks his blood sugar when he "looks off".  Hi PO intake has significantly decreased and he has required little insulin while inpatient.  Ordered the Living Well with Diabetes booklet.    Will continue to follow while inpatient.  Thank you, Reche Dixon, MSN, [facility] Diabetes Coordinator Inpatient Diabetes Program [phone] (team pager from 8a-5p)

## 2022-04-10 NOTE — Progress Notes (Signed)
Attempted Echocardiogram, patient is not in the bed, will attempt later.  

## 2022-04-10 NOTE — Progress Notes (Addendum)
STROKE TEAM PROGRESS NOTE   INTERVAL HISTORY His significant other is at the bedside.  Sitting up in the chair. They are in agreement with DAPT and starting a statin.  PT/OT currently recommending CIR.   Vitals:   04/09/22 1245 04/09/22 1521 04/09/22 2346 04/10/22 0754  BP: 123/68  (!) 107/51 135/64  Pulse: 81  72 72  Resp: 19  14   Temp:  97.7 F (36.5 C) 98.3 F (36.8 C) 97.8 F (36.6 C)  TempSrc:  Oral  Oral  SpO2: 100%  100% 100%   CBC:  Recent Labs  Lab 04/09/22 1210 04/09/22 1230 04/10/22 0302  WBC 7.5  --  6.1  NEUTROABS 2.4  --   --   HGB 11.3* 13.3 10.6*  HCT 37.0* 39.0 32.7*  MCV 79.7*  --  77.1*  PLT 171  --  725   Basic Metabolic Panel:  Recent Labs  Lab 04/09/22 1210 04/09/22 1230 04/10/22 0302  NA 135 137 136  K 4.0 4.0 4.3  CL 104 104 104  CO2 20*  --  20*  GLUCOSE 162* 160* 82  BUN 15 18 13   CREATININE 1.64* 1.70* 1.50*  CALCIUM 9.0  --  8.7*   Lipid Panel:  Recent Labs  Lab 04/10/22 0302  CHOL 152  TRIG 137  HDL 35*  CHOLHDL 4.3  VLDL 27  LDLCALC 90   HgbA1c:  Recent Labs  Lab 04/10/22 0302  HGBA1C 10.1*   Urine Drug Screen: No results for input(s): "LABOPIA", "COCAINSCRNUR", "LABBENZ", "AMPHETMU", "THCU", "LABBARB" in the last 168 hours.  Alcohol Level  Recent Labs  Lab 04/09/22 1210  ETH <10    IMAGING past 24 hours EEG adult  Result Date: 04/09/2022 Derek Jack, MD     04/09/2022  7:49 PM Routine EEG Report Zaedyn Covin. is a 89 y.o. male with a history of spells and altered mental status who is undergoing an EEG to evaluate for seizures. Report: This EEG was acquired with electrodes placed according to the International 10-20 electrode system (including Fp1, Fp2, F3, F4, C3, C4, P3, P4, O1, O2, T3, T4, T5, T6, A1, A2, Fz, Cz, Pz). The following electrodes were missing or displaced: none. The occipital dominant rhythm was 6-8 Hz. This activity is reactive to stimulation. Drowsiness was manifested by background  fragmentation; deeper stages of sleep were not identified. There was no focal slowing. There were no interictal epileptiform discharges. There were no electrographic seizures identified. Hyperventilation and photic stimulation were not performed. Impression and clinical correlation: This EEG was obtained while awake and drowsy and is abnormal due to mild diffuse slowing indicative of global cerebral dysfunction. Epileptiform abnormalities were not seen during this recording. Su Monks, MD [facility] [phone] If 7pm- 7am, please page neurology on call as listed in [place].   MR BRAIN WO CONTRAST  Result Date: 04/09/2022 CLINICAL DATA:  Left-sided weakness EXAM: MRI HEAD WITHOUT CONTRAST TECHNIQUE: Multiplanar, multiecho pulse sequences of the brain and surrounding structures were obtained without intravenous contrast. COMPARISON:  Same day CT stroke code and CTA head/neck angiogram. FINDINGS: Brain: Small regions of acute infarct in the periventricular right posterior frontal lobe/corona radiata (series 4, image 40). Evidence of hemorrhage. There is sequela of moderate chronic microvascular ischemic change. No hemorrhage. Advanced generalized volume loss. Vascular: Normal flow voids. Skull and upper cervical spine: Redemonstrated is mild narrowing at the craniocervical junction. Sinuses/Orbits: Bilateral lens replacement. Paranasal sinuses are clear. Other: None. IMPRESSION: Small foci of acute  infarct in the right periventricular posterior frontal lobe/corona radiata. No hemorrhage or mass effect. Electronically Signed   By: Lorenza Cambridge M.D.   On: 04/09/2022 15:26   DG Chest Portable 1 View  Result Date: 04/09/2022 CLINICAL DATA:  Rule out pneumonia. Right years. Slurred speech and mild left arm pain and left leg weakness. EXAM: PORTABLE CHEST 1 VIEW COMPARISON:  12/06/2021 FINDINGS: Stable cardiomediastinal contours. No pleural effusion or edema. Chronic bronchitic changes are identified  bilaterally. No superimposed airspace consolidation. Visualized osseous structures are unremarkable. IMPRESSION: 1. No acute findings. 2. Chronic bronchitic changes. Electronically Signed   By: Signa Kell M.D.   On: 04/09/2022 13:17   CT ANGIO HEAD NECK W WO CM (CODE STROKE)  Result Date: 04/09/2022 CLINICAL DATA:  Slurred speech.  Left-sided weakness. EXAM: CT ANGIOGRAPHY HEAD AND NECK TECHNIQUE: Multidetector CT imaging of the head and neck was performed using the standard protocol during bolus administration of intravenous contrast. Multiplanar CT image reconstructions and MIPs were obtained to evaluate the vascular anatomy. Carotid stenosis measurements (when applicable) are obtained utilizing NASCET criteria, using the distal internal carotid diameter as the denominator. RADIATION DOSE REDUCTION: This exam was performed according to the departmental dose-optimization program which includes automated exposure control, adjustment of the mA and/or kV according to patient size and/or use of iterative reconstruction technique. CONTRAST:  75 ml Omni 350 COMPARISON:  CT Head 04/30/21 FINDINGS: CT HEAD FINDINGS Brain: No evidence of acute infarction, hemorrhage, hydrocephalus, extra-axial collection or mass lesion/mass effect. Is sequela of severe chronic microvascular ischemic change with advanced generalized volume loss. No evidence of hydrocephalus. Vascular: No hyperdense vessel or unexpected calcification. Skull: Normal. Negative for fracture or focal lesion. Sinuses/Orbits: No mastoid or middle ear effusion. Paranasal sinuses are clear. Bilateral lens replacement. Orbits are otherwise unremarkable. Other: None. Review of the MIP images confirms the above findings CTA NECK FINDINGS Aortic arch: Standard branching. Imaged portion shows no evidence of aneurysm or dissection. No significant stenosis of the major arch vessel origins. Right carotid system: No evidence of dissection, stenosis (50% or greater), or  occlusion. Mild atherosclerotic calcification at the carotid bifurcation Left carotid system: No evidence of dissection, stenosis (50% or greater), or occlusion. Vertebral arteries: Moderate stenosis of the origin of the left vertebral artery secondary to calcified atherosclerotic plaque. Skeleton: There are degenerative changes at C1-C2 articulation where there is a large pannus and ligamentum flavum hypertrophy. This results in moderate spinal canal stenosis at the craniocervical junction. Other neck: Negative. Upper chest: Biapical bronchial wall thickening as can be seen in the setting of bronchitis or small airways disease. Review of the MIP images confirms the above findings CTA HEAD FINDINGS Anterior circulation: 1 of the proximal M2 branches on the right is likely occluded (series 7, image 99). ) Posterior circulation: Moderate focal stenosis in the P2 segment of the right PCA (series 11, image 77). Venous sinuses: As permitted by contrast timing, patent. Anatomic variants: None Review of the MIP images confirms the above findings IMPRESSION: 1. No hemorrhage or CT evidence of an acute infarct.  Aspects is 10. 2. One of the proximal M2 branches on the right is severely stenosed to nearly occluded. 3. Moderate focal stenosis in the P2 segment of the right PCA. 4. Moderate stenosis of the origin of the left vertebral artery. 5. Moderate spinal canal stenosis at the craniocervical junction due to ligamentum falvum hypertrophy and pannus. Findings were paged to Dr. Derry Lory on 04/09/22 at 12:50 PM via [facility] paging system.  Electronically Signed   By: Marin Roberts M.D.   On: 04/09/2022 12:57   CT HEAD CODE STROKE WO CONTRAST`  Result Date: 04/09/2022 CLINICAL DATA:  Slurred speech.  Left-sided weakness. EXAM: CT ANGIOGRAPHY HEAD AND NECK TECHNIQUE: Multidetector CT imaging of the head and neck was performed using the standard protocol during bolus administration of intravenous contrast. Multiplanar CT image  reconstructions and MIPs were obtained to evaluate the vascular anatomy. Carotid stenosis measurements (when applicable) are obtained utilizing NASCET criteria, using the distal internal carotid diameter as the denominator. RADIATION DOSE REDUCTION: This exam was performed according to the departmental dose-optimization program which includes automated exposure control, adjustment of the mA and/or kV according to patient size and/or use of iterative reconstruction technique. CONTRAST:  75 ml Omni 350 COMPARISON:  CT Head 04/30/21 FINDINGS: CT HEAD FINDINGS Brain: No evidence of acute infarction, hemorrhage, hydrocephalus, extra-axial collection or mass lesion/mass effect. Is sequela of severe chronic microvascular ischemic change with advanced generalized volume loss. No evidence of hydrocephalus. Vascular: No hyperdense vessel or unexpected calcification. Skull: Normal. Negative for fracture or focal lesion. Sinuses/Orbits: No mastoid or middle ear effusion. Paranasal sinuses are clear. Bilateral lens replacement. Orbits are otherwise unremarkable. Other: None. Review of the MIP images confirms the above findings CTA NECK FINDINGS Aortic arch: Standard branching. Imaged portion shows no evidence of aneurysm or dissection. No significant stenosis of the major arch vessel origins. Right carotid system: No evidence of dissection, stenosis (50% or greater), or occlusion. Mild atherosclerotic calcification at the carotid bifurcation Left carotid system: No evidence of dissection, stenosis (50% or greater), or occlusion. Vertebral arteries: Moderate stenosis of the origin of the left vertebral artery secondary to calcified atherosclerotic plaque. Skeleton: There are degenerative changes at C1-C2 articulation where there is a large pannus and ligamentum flavum hypertrophy. This results in moderate spinal canal stenosis at the craniocervical junction. Other neck: Negative. Upper chest: Biapical bronchial wall thickening as  can be seen in the setting of bronchitis or small airways disease. Review of the MIP images confirms the above findings CTA HEAD FINDINGS Anterior circulation: 1 of the proximal M2 branches on the right is likely occluded (series 7, image 99). ) Posterior circulation: Moderate focal stenosis in the P2 segment of the right PCA (series 11, image 77). Venous sinuses: As permitted by contrast timing, patent. Anatomic variants: None Review of the MIP images confirms the above findings IMPRESSION: 1. No hemorrhage or CT evidence of an acute infarct.  Aspects is 10. 2. One of the proximal M2 branches on the right is severely stenosed to nearly occluded. 3. Moderate focal stenosis in the P2 segment of the right PCA. 4. Moderate stenosis of the origin of the left vertebral artery. 5. Moderate spinal canal stenosis at the craniocervical junction due to ligamentum falvum hypertrophy and pannus. Findings were paged to Dr. Lorrin Goodell on 04/09/22 at 12:50 PM via [facility] paging system. Electronically Signed   By: Marin Roberts M.D.   On: 04/09/2022 12:57    PHYSICAL EXAM GENERAL: Awake, alert, in no acute distress LUNGS: Normal respiratory effort. Non-labored breathing on room air Extremities: warm, well perfused, without obvious deformity   NEURO:  Mental Status: Awake, alert, and oriented to person but disoriented to place time and situation He is not able to provide a clear and coherent history of present illness. Speech/Language: speech is clear and fluent, but slightly dysarthric per family.   No neglect is noted Cranial Nerves:  II: Pupils equal and round, no vision  due to glaucoma III, IV, VI: EOMI. Lid elevation symmetric and full.  V: Sensation is intact to light touch and symmetrical to face.  VII: Face is symmetric resting and smiling.  VIII: Hearing intact to voice IX, X: Voice slightly dysarthric per patient's family XI: Normal sternocleidomastoid and trapezius muscle strength XII: Tongue protrudes  midline without fasciculations.   Motor: 5/5 strength in right upper extremity and right lower extremity, 4+ out of 5 strength in left upper extremity and subtle drift in left lower extremity Tone is normal. Bulk is normal.  Sensation: Intact to light touch bilaterally in all four extremities. No extinction to DSS present.  Coordination: Difficult to assess due to blindness but able to find nose with fingers on both hands.  No drift in arms, but subtle drift in left lower extremity  Gait: Deferred  ASSESSMENT/PLAN Mr. Wlliam Grosso. is a 89 y.o. male with history of dementia, blindness due to glaucoma and diabetes who was last known to be normal 04/08/2022 at 2000.  At 0700 the next morning, he was noted by his wife to have left sided weakness and slurred speech.  He was unable to stand up to be cleaned up after being incontinent of urine.  He also had some shaking of bilateral hands and feet, which has not occurred before, but was able to converse normally while this was going on.    Stroke:  Small acute infarct in the right periventricular posterior frontal lobe Etiology:  SVD due to uncontrolled risk factors Code Stroke CT head -1. No hemorrhage or CT evidence of an acute infarct.  Aspects is 10. CTA head & neck  One of the proximal M2 branches on the right is severely stenosed to nearly occluded. Moderate focal stenosis in the P2 segment of the right PCA. Moderate stenosis of the origin of the left vertebral artery. Moderate spinal canal stenosis at the craniocervical junction due to ligamentum falvum hypertrophy and pannus. MRI  Small foci of acute infarct in the right periventricular posterior frontal lobe/corona radiata. No hemorrhage or mass effect. 2D Echo EF 42-70% grade 1 diastolic dysfunciton EEG- This EEG was obtained while awake and drowsy and is abnormal due to mild diffuse slowing indicative of global cerebral dysfunction. Epileptiform abnormalities were not seen during this  recording. LDL 90 HgbA1c 10.1 VTE prophylaxis - SCDs    Diet   Diet regular Room service appropriate? Yes; Fluid consistency: Thin   aspirin 81 mg daily prior to admission, now on aspirin 81 mg daily and clopidogrel 75 mg daily and then plavix 75mg  alone Therapy recommendations:  CIR Disposition:  Pending   Hypertension Home meds:  Losartan, metoprolol Stable Permissive hypertension (OK if < 220/120) but gradually normalize in 5-7 days Long-term BP goal normotensive  Hyperlipidemia Home meds:  None, resumed in hospital LDL 90, goal < 70 Add Crestor 20mg   Continue statin at discharge  Diabetes type II Uncontrolled Home meds:  Metformin HgbA1c 10.1, goal < 7.0 CBGs Recent Labs    04/09/22 2346 04/10/22 0342 04/10/22 0738  GLUCAP 124* 103* 90    SSI  Other Stroke Risk Factors Advanced Age >/= 91   Other Active Problems Dementia Continue Aricept Glaucoma   Hospital day # 1  Patient seen and examined by NP/APP with MD. MD to update note as needed.   Janine Ores, DNP, FNP-BC [facility] Pager: [phone]  ATTENDING ATTESTATION:  89 year old right frontal CVA.  CTA is negative.  EEG is negative.  His  exam is largely unremarkable and nonfocal.  Stroke workup is completed.  Recommend DAPT therapy for 3 weeks then Plavix alone.  He can follow-up in stroke clinic outpatient Dr. Leonie Man in 6 to 8 weeks.  Neurology signed off please call with questions.  Dr. Reeves Forth evaluated pt independently, reviewed imaging, chart, labs. Discussed and formulated plan with the Resident/APP. Changes were made to the note where appropriate. Please see APP/resident note above for details.    Tenleigh Byer,MD    To contact Stroke Continuity provider, please refer to [email or web address] After hours, contact General Neurology

## 2022-04-10 NOTE — Evaluation (Signed)
Speech Language Pathology Evaluation Patient Details Name: Grant Jensen. MRN: [id number] DOB: 03-22-1928 Today's Date: 04/10/2022 Time: 5852-7782 SLP Time Calculation (min) (ACUTE ONLY): 8 min  Problem List:  Patient Active Problem List   Diagnosis Date Noted   Acute CVA (cerebrovascular accident) ([place]) 04/09/2022   Rhabdomyolysis 05/01/2020   Dementia ([place]) 05/01/2020   Fall at home, initial encounter 05/01/2020   Diabetes ([place]) 05/01/2020   Essential hypertension 05/01/2020   NICM (nonischemic cardiomyopathy) ([place]) 05/01/2020   Chest pain with high risk for cardiac etiology 06/20/2014   Past Medical History:  Past Medical History:  Diagnosis Date   Diabetes mellitus without complication ([place])    Glaucoma    Past Surgical History:  Past Surgical History:  Procedure Laterality Date   LEFT HEART CATHETERIZATION WITH CORONARY ANGIOGRAM N/A 06/22/2014   Procedure: LEFT HEART CATHETERIZATION WITH CORONARY ANGIOGRAM;  Surgeon: Adrian Prows, MD;  Location: [facility] CATH LAB;  Service: Cardiovascular;  Laterality: N/A;   HPI:  Pt is a 89 y.o. male who presented 04/09/22 with left sided weakness and dysarthria. Outside window for TNK. CT head shows no acute abnormality. MRI showing small foci of acute infarct in the right periventricular posterior frontal lobe/corona radiata. No hemorrhage or mass effect.   PMH: dementia, DM, blindness due to glaucoma   Assessment / Plan / Recommendation Clinical Impression  Pt presents with a very mild dysarthria. He is able to follow commands, make jokes and express wants and needs. He is fully intelligible and given edentulous status would have assumed speech is baseline if family at bedside hadnt reported otherwise. Pt unlikely to benefit from SLP interventions for mild dysarthria given they are typically compensatory strategies requiring memory abilty for carry over. Pts cognition impaired at baseline and family already fully responsible for pts care. Would  not recommend f/u SLP interventions in this case. Pts effort better spent with PT/OT to address weakness and mobility.    SLP Assessment  SLP Recommendation/Assessment: Patient does not need any further Speech [facility] SLP Visit Diagnosis: Dysarthria and anarthria (R47.1)    Recommendations for follow up therapy are one component of a multi-disciplinary discharge planning process, led by the attending physician.  Recommendations may be updated based on patient status, additional functional criteria and insurance authorization.                   SLP Evaluation Cognition  Overall Cognitive Status: History of cognitive impairments - at baseline Arousal/Alertness: Awake/alert Orientation Level: Oriented to person;Disoriented to place;Disoriented to time;Disoriented to situation Attention: Focused Focused Attention: Appears intact Memory: Impaired Awareness: Impaired Awareness Impairment: Emergent impairment;Intellectual impairment Problem Solving: Impaired       Comprehension  Auditory Comprehension Overall Auditory Comprehension: Appears within functional limits for tasks assessed    Expression Verbal Expression Overall Verbal Expression: Appears within functional limits for tasks assessed Written Expression Dominant Hand: Right   Oral / Motor  Oral Motor/Sensory Function Overall Oral Motor/Sensory Function: Within functional limits Motor Speech Overall Motor Speech: Impaired Respiration: Within functional limits Phonation: Normal Resonance: Within functional limits Articulation: Impaired Level of Impairment: Conversation Intelligibility: Intelligible Motor Planning: Witnin functional limits Motor Speech Errors: Unaware;Consistent            Ramesh Moan, Katherene Ponto 04/10/2022, 2:36 PM

## 2022-04-11 DIAGNOSIS — G8194 Hemiplegia, unspecified affecting left nondominant side: Secondary | ICD-10-CM

## 2022-04-11 DIAGNOSIS — I639 Cerebral infarction, unspecified: Secondary | ICD-10-CM | POA: Diagnosis not present

## 2022-04-11 LAB — GLUCOSE, CAPILLARY
Glucose-Capillary: 108 mg/dL — ABNORMAL HIGH (ref 70–99)
Glucose-Capillary: 194 mg/dL — ABNORMAL HIGH (ref 70–99)
Glucose-Capillary: 138 mg/dL — ABNORMAL HIGH (ref 70–99)
Glucose-Capillary: 130 mg/dL — ABNORMAL HIGH (ref 70–99)

## 2022-04-11 LAB — COMPREHENSIVE METABOLIC PANEL
ALT: 17 U/L (ref 0–44)
AST: 30 U/L (ref 15–41)
Albumin: 3.7 g/dL (ref 3.5–5.0)
Alkaline Phosphatase: 62 U/L (ref 38–126)
Anion gap: 11 (ref 5–15)
BUN: 14 mg/dL (ref 8–23)
CO2: 20 mmol/L — ABNORMAL LOW (ref 22–32)
Calcium: 8.8 mg/dL — ABNORMAL LOW (ref 8.9–10.3)
Chloride: 105 mmol/L (ref 98–111)
Creatinine, Ser: 1.5 mg/dL — ABNORMAL HIGH (ref 0.61–1.24)
GFR, Estimated: 44 mL/min — ABNORMAL LOW (ref >60)
Glucose, Bld: 131 mg/dL — ABNORMAL HIGH (ref 70–99)
Potassium: 4 mmol/L (ref 3.5–5.1)
Sodium: 136 mmol/L (ref 135–145)
Total Bilirubin: 0.4 mg/dL (ref 0.3–1.2)
Total Protein: 6.7 g/dL (ref 6.5–8.1)

## 2022-04-11 LAB — CBC
HCT: 35.2 % — ABNORMAL LOW (ref 39.0–52.0)
Hemoglobin: 11.4 g/dL — ABNORMAL LOW (ref 13.0–17.0)
MCH: 25.3 pg — ABNORMAL LOW (ref 26.0–34.0)
MCHC: 32.4 g/dL (ref 30.0–36.0)
MCV: 78 fL — ABNORMAL LOW (ref 80.0–100.0)
Platelets: 178 10*3/uL (ref 150–400)
RBC: 4.51 MIL/uL (ref 4.22–5.81)
RDW: 14.2 % (ref 11.5–15.5)
WBC: 7.2 10*3/uL (ref 4.0–10.5)
nRBC: 0 % (ref 0.0–0.2)

## 2022-04-11 LAB — MAGNESIUM: Magnesium: 1.8 mg/dL (ref 1.7–2.4)

## 2022-04-11 NOTE — PMR Pre-admission (Signed)
PMR Admission Coordinator Pre-Admission Assessment  Patient: Grant Jensen. is an 89 y.o., male MRN: [id number] DOB: 1928-06-21 Height: 5' 2"$  (157.5 cm) Weight: 80.7 kg (Per wife)              Insurance Information HMO: yes    PPO:      PCP:      IPA:      80/20:      OTHER:  PRIMARY: [place] [employer]      Policy#: [id number]      Subscriber: pt CM Name: Ward Chatters      Phone#: [phone] option #7     Fax#: [phone] Pre-Cert#: [id number] approved for 7 days     Employer: 2/6 Benefits:  Phone #: [phone]     Name: 2/6 Eff. Date: 04/06/22     Deduct: none      Out of Pocket Max: $3600      Life Max: none  CIR: $295 co pay per day days 1 until 5      SNF: no co pay per day days 1 until 20; $203 co pay per day days 21 until 100 Outpatient: $20 per visit     Co-Pay: visits per medical neccesity Home Health: 100%      Co-Pay: visits per medical neccesity DME: 80%     Co-Pay: 20% Providers: in network  SECONDARY: none  Financial Counselor:       Phone#:   The Engineer, petroleum" for patients in Inpatient Rehabilitation Facilities with attached "Privacy Act [place] Records" was provided and verbally reviewed with: Family  Emergency Contact Information Contact Information     Name Relation Home Work Mobile   [place] Spouse [phone]  [phone]      Current Medical History  Patient Admitting Diagnosis: CVA  History of Present Illness: 89 year old male with history of HTN, DM, dementia, nonischemic cardiomyopathy, glaucoma/blindness presented 04/09/22 with left sided weakness.  MRI revealed small foci of acute infarct in the right periventricular posterior frontal lobe/corona radiata. Neurology consulted and felt likely due to small vessel disease. CTA head and neck with one of the proximal M2 branches on the right is severely stenosed to nearly occluded. Moderate focal stenosis in the P2 segment of the right PCA. Moderate  stenosis of the origin of the left vertebral artery. Moderate spinal canal stenosis at the craniocervical junction due to ligamentum falvum hypertrophy and pannus. 2 D echo EF 60 to 65 % grade 1 diastolic dysfunction. EEG is banromal due to mild diffuse slowing indicative of global cerebral dysfunction. Epileptiform abnormalities were not seen.   Recommends ASA and Clopidogrel and then ASA alone. Allow permissive HTN and then normalize with home meds of losartan and metoprolol. LDL 90 and to add Crestor. Hgb A1c 10.1 on metformin, and SSI.  Complete NIHSS TOTAL: 5 Glasgow Coma Scale Score: 15  Patient's medical record from [facility] has been reviewed by the rehabilitation admission coordinator and physician.  Past Medical History  Past Medical History:  Diagnosis Date   Blindness    Dementia ([place])    Diabetes mellitus without complication ([place])    Glaucoma    Hypertension    Has the patient had major surgery during 100 days prior to admission? No  Family History  family history is not on file.   Current Medications   Current Facility-Administered Medications:    acetaminophen (TYLENOL) tablet 650 mg, 650 mg, Oral, Q4H PRN, 650 mg at 04/11/22 0634 **OR**  acetaminophen (TYLENOL) 160 MG/5ML solution 650 mg, 650 mg, Per Tube, Q4H PRN **OR** acetaminophen (TYLENOL) suppository 650 mg, 650 mg, Rectal, Q4H PRN, Marcelyn Bruins, MD   aspirin EC tablet 81 mg, 81 mg, Oral, Daily, Shafer, Devon, NP, 81 mg at 04/14/22 1000   clopidogrel (PLAVIX) tablet 75 mg, 75 mg, Oral, Daily, Shafer, Devon, NP, 75 mg at 04/14/22 1000   donepezil (ARICEPT) tablet 5 mg, 5 mg, Oral, QHS, Marcelyn Bruins, MD, 5 mg at 04/13/22 2132   hydrOXYzine (ATARAX) tablet 10 mg, 10 mg, Oral, QHS PRN, Marcelyn Bruins, MD, 10 mg at 04/11/22 2207   insulin aspart (novoLOG) injection 0-9 Units, 0-9 Units, Subcutaneous, TID WC, Marcelyn Bruins, MD, 2 Units at 04/13/22 1719   rosuvastatin (CRESTOR) tablet  20 mg, 20 mg, Oral, Daily, Shafer, Devon, NP, 20 mg at 04/14/22 1000   senna-docusate (Senokot-S) tablet 1 tablet, 1 tablet, Oral, QHS PRN, Marcelyn Bruins, MD  Patients Current Diet:  Diet Order             Diet regular Room service appropriate? Yes; Fluid consistency: Thin  Diet effective now                  Precautions / Restrictions Precautions Precautions: Fall, Other (comment) Precaution Comments: blind Restrictions Weight Bearing Restrictions: No   Has the patient had 2 or more falls or a fall with injury in the past year?Yes  Prior Activity Level Limited Community (1-2x/wk): wife provides supervision / min assist with all ADLS and ambulation (Goes ot [facility] [facility] day care program 2 to 3 times per week, wife transports)  Prior Functional Level Prior Function Prior Level of Function : History of Falls (last six months), Needs assist Mobility Comments: Pt requires bil HHA or RW for mobility and guidance for all standing mobility due to his blindness. Able to ambulate household distances and navigate stairs with guidance. Hx of falls. ADLs Comments: Wife assists with all ADLs due to blindness and dementia. Pt able to assist in dressing.  Self Care: Did the patient need help bathing, dressing, using the toilet or eating?  Dependent  Indoor Mobility: Did the patient need assistance with walking from room to room (with or without device)? Dependent  Stairs: Did the patient need assistance with internal or external stairs (with or without device)? Dependent  Functional Cognition: Did the patient need help planning regular tasks such as shopping or remembering to take medications? Dependent  Patient Information Are you of Hispanic, Latino/a,or Spanish origin?: X. Patient unable to respond, A. No, not of Hispanic, Latino/a, or Spanish origin What is your race?: X. Patient unable to respond, B. Black or African American Do you need or want an interpreter to  communicate with a doctor or health care staff?: 9. Unable to respond (no)  Patient's Response To:  Health Literacy and Transportation Is the patient able to respond to health literacy and transportation needs?: No Health Literacy - How often do you need to have someone help you when you read instructions, pamphlets, or other written material from your doctor or pharmacy?: Patient unable to respond (unable to respond; always) In the past 12 months, has lack of transportation kept you from medical appointments or from getting medications?: No In the past 12 months, has lack of transportation kept you from meetings, work, or from getting things needed for daily living?: No  [place] Devices/Equipment: Environmental consultant (specify type) Home Equipment: Conservation officer, nature (  2 wheels), Transport chair, BSC/3in1, Shower seat, Grab bars - tub/shower, Hand held shower head  Prior Device Use: Indicate devices/aids used by the patient prior to current illness, exacerbation or injury? Walker  Current Functional Level Cognition  Arousal/Alertness: Awake/alert Overall Cognitive Status: History of cognitive impairments - at baseline Orientation Level: Oriented to person General Comments: Wife reports he seems at his baseline currently. Pt has dementia and asks questions frequently and fidgets with things often. Attention: Focused Focused Attention: Appears intact Memory: Impaired Awareness: Impaired Awareness Impairment: Emergent impairment, Intellectual impairment Problem Solving: Impaired    Extremity Assessment (includes Sensation/Coordination)  Upper Extremity Assessment: Overall WFL for tasks assessed, Difficult to assess due to impaired cognition (Patient with no weakness in L side when assessed, able to follow commands to complete strength assessment, however will continue to assess)  Lower Extremity Assessment: Defer to PT evaluation LLE Deficits / Details: MMT scores  of 4 hip flexion, 4+ knee extension; appeared to be hypersensitive to touch at the foot noted by pt withdrawing and often pulling foot up off the ground; possible deficits in sensation and proprioception noted by poor awareness of his foot location; incoordination noted LLE Coordination: decreased gross motor, decreased fine motor    ADLs  Overall ADL's : Needs assistance/impaired Eating/Feeding: Maximal assistance, Sitting Eating/Feeding Details (indicate cue type and reason): secondary to blindness Grooming: Wash/dry hands, Wash/dry face, Sitting, Minimal assistance Upper Body Bathing: Moderate assistance, Sitting Lower Body Bathing: Maximal assistance, Total assistance, Sitting/lateral leans, Sit to/from stand Upper Body Dressing : Moderate assistance, Sitting Lower Body Dressing: Maximal assistance, Total assistance, Sitting/lateral leans, Sit to/from stand, +2 for physical assistance Lower Body Dressing Details (indicate cue type and reason): changing brief out in standing, needing assist to thread and to doff and don, unable  to complete peri-care in standing Toilet Transfer: Moderate assistance, +2 for physical assistance, +2 for safety/equipment, Stand-pivot, Cueing for sequencing, Cueing for safety Toilet Transfer Details (indicate cue type and reason): simulated with sit<>stand transfers, cues to advance feet but noted increased ability to move L foot Toileting- Clothing Manipulation and Hygiene: Total assistance, +2 for physical assistance, +2 for safety/equipment, Sitting/lateral lean, Sit to/from stand Toileting - Clothing Manipulation Details (indicate cue type and reason): total A for peri care in standing Functional mobility during ADLs: Moderate assistance, +2 for physical assistance, +2 for safety/equipment, Cueing for safety, Cueing for sequencing General ADL Comments: Session focus on sit<>stands and functional mobility to increase overall activity tolerance    Mobility   Overal bed mobility: Needs Assistance Bed Mobility: Supine to Sit, Sit to Supine Supine to sit: Min guard Sit to supine: Min assist General bed mobility comments: Min A at the LE for sitting to supine. Min Verbal/tactile  cues for pt to get from supine to sitting    Transfers  Overall transfer level: Needs assistance Equipment used: Rolling walker (2 wheels) Transfers: Sit to/from Stand Sit to Stand: Min assist Bed to/from chair/wheelchair/BSC transfer type:: Stand pivot Stand pivot transfers: Mod assist, +2 physical assistance, +2 safety/equipment Squat pivot transfers: Mod assist, +2 physical assistance General transfer comment: Min A for hand placement with AD and verbal cues for body mechanics in order to decrease risk for falls.    Ambulation / Gait / Stairs / Wheelchair Mobility  Ambulation/Gait Ambulation/Gait assistance: Max Web designer (Feet): 30 Feet Assistive device: Rolling walker (2 wheels) Gait Pattern/deviations: Step-to pattern, Decreased step length - left, Decreased stance time - left, Decreased weight shift to left General Gait  Details: Pt requires Max A for verbal cues and assistance navigating RW with cuing "walker, big step L, big step R" for each step for safe ambulation. Intermittenty pt would need cueing to take two steps with the L to surpass the R for a step to or partial step through to prevent the LLE from trailing. Gait velocity: Decreased cadence. Gait velocity interpretation: <1.31 ft/sec, indicative of household ambulator Stairs:  (Unable at this time due to current functional status.)    Posture / Balance Dynamic Sitting Balance Sitting balance - Comments: No LOB while sitting. Balance Overall balance assessment: Needs assistance Sitting-balance support: Single extremity supported, Feet supported Sitting balance-Leahy Scale: Fair Sitting balance - Comments: No LOB while sitting. Postural control: Posterior lean, Left lateral lean Standing  balance support: Bilateral upper extremity supported, Single extremity supported Standing balance-Leahy Scale: Fair Standing balance comment: Pt can stand without AD or UE support but with dynamic activities requires AD and constant cueing in order to maintain upright balance and prevent fall mostly due to poor kinesthetic awareness at the LE.    Special needs/care consideration Blind and dementia Fall precautions Hgb A1c 10.1   Previous Home Environment  Living Arrangements: Spouse/significant other  Lives With: Spouse (of 20 years) Available Help at Discharge: Family, Available 24 hours/day Type of Home: House Home Layout: One level Home Access: Stairs to enter Entrance Stairs-Rails: Right, Left Entrance Stairs-Number of Steps: 3 Bathroom Shower/Tub: Optometrist: Yes Home Care Services: No Additional Comments: goes to [facility] day care 2 to 3 times per week ; paid by DSS; wife transports  Discharge Living Setting Plans for Discharge Living Setting: Patient's home, Lives with (comment) (wife of 20 years) Type of Home at Discharge: House Discharge Home Layout: One level Discharge Home Access: Stairs to enter Entrance Stairs-Rails: Right, Left Entrance Stairs-Number of Steps: 3 Discharge Bathroom Shower/Tub: Tub/shower unit Discharge Bathroom Toilet: Standard Discharge Bathroom Accessibility: Yes How Accessible: Accessible via walker Does the patient have any problems obtaining your medications?: No  Social/Family/Support Systems Patient Roles: Spouse Contact Information: wife, Landscape architect Anticipated Caregiver: Wife , Marketing executive Information: see contacts Ability/Limitations of Caregiver: wife is 75 years younger, they have been married 38 years 3/1 Caregiver Availability: 24/7 Discharge Plan Discussed with Primary Caregiver: Yes Is Caregiver In Agreement with Plan?: Yes Does  Caregiver/Family have Issues with Lodging/Transportation while Pt is in Rehab?: No  Goals Patient/Family Goal for Rehab: min assist with PT and OT, min to max asisst with SLP Expected length of stay: ELOS 7 to 10 days Additional Information: Was at [facility] 05/04/20 Pt/Family Agrees to Admission and willing to participate: Yes Program Orientation Provided & Reviewed with Pt/Caregiver Including Roles  & Responsibilities: Yes  Decrease burden of Care through IP rehab admission: n/a  Possible need for SNF placement upon discharge:not anticipated  Patient Condition: This patient's medical and functional status has changed since the consult dated: 04/11/22 in which the Rehabilitation Physician determined and documented that the patient's condition is appropriate for intensive rehabilitative care in an inpatient rehabilitation facility. See "History of Present Illness" (above) for medical update. Functional changes are: overall mod assist. Patient's medical and functional status update has been discussed with the Rehabilitation physician and patient remains appropriate for inpatient rehabilitation. Will admit to inpatient rehab today.  Preadmission Screen Completed By:  Cleatrice Burke, RN, 04/14/2022 10:37 AM ______________________________________________________________________   Discussed status with Dr. Naaman Plummer on 04/14/22 at 1038 and received  approval for admission today.  Admission Coordinator:  Cleatrice Burke, time [id number] Date 04/14/22

## 2022-04-11 NOTE — Progress Notes (Signed)
Mobility Specialist Progress Note   04/11/22 1525  Mobility  Activity Transferred from bed to chair  Level of Assistance +2 (takes two people) (Mod A)  Assistive Device Other (Comment) (HHA)  Range of Motion/Exercises Active;All extremities  Activity Response Tolerated well   Patient received in recliner presenting with confusion but agreeable to participate. RN requesting patient return to bed. Required mod A +2 and multimodal cues to stand/pivot to bed from recliner chair. Initially patient was resistive to laying down but with cues and reorienting to task patient agreeable. Required TA from sit to supine and for bed mobility. Tolerated without complaint or incident. Was left in supine with all needs met, restraints and mittens applied, bed alarm set and call bell in reach. RN present throughout.   [place] Frayda Egley, BS EXP Mobility Specialist Please contact via SecureChat or Rehab office at [phone]

## 2022-04-11 NOTE — Consult Note (Signed)
Physical Medicine and Rehabilitation Consult Reason for Consult: New mobility deficits after stroke Referring Physician: Feliberto Gottron   HPI: Grant Jensen. is a 89 y.o. male with a history of dementia and blindness who was admitted on 04/09/2022 with increased left-sided weakness and dysarthria.  He was unable to stand for hygiene and personal care tasks.  CT of the head was negative.  EEG was performed and showed mild diffuse slowing.  MRI ultimately demonstrated small foci of acute infarct in the right periventricular posterior frontal lobe/corona radiata.  Neurology feels that stroke is due to small vessel disease.  Patient has been working with physical therapy while on acute and has been making progress.  Today he was requiring 2 person moderate assistance for sit to stand transfers and ambulation in the room with rolling walker.  He was requiring manual assistance/tactile facilitation for weight shift and advancing the left leg forward.  Prior to arrival, patient was able to assist in his self-care and mobility from a physical aspect.  He was able to transfer on his own and could ambulate short distances with supervision to contact-guard assistance per wife.  Patient was attending adult daycare 3 days a week as well.  Patient was occasionally incontinent at home at baseline.  He wore undergarments for protection.  He has had issues with falls at home as well.   Review of Systems  Constitutional:  Negative for fever.  HENT:  Positive for hearing loss.   Eyes:  Eye pain: blind.  Respiratory:  Negative for cough.   Cardiovascular:  Negative for chest pain.  Gastrointestinal:  Negative for nausea.  Genitourinary:  Negative for dysuria.  Musculoskeletal:  Positive for falls.  Neurological:  Positive for focal weakness and weakness.  Psychiatric/Behavioral:  Positive for memory loss.    Past Medical History:  Diagnosis Date   Blindness    Dementia ([place])    Diabetes mellitus without  complication ([place])    Glaucoma    Hypertension    Past Surgical History:  Procedure Laterality Date   LEFT HEART CATHETERIZATION WITH CORONARY ANGIOGRAM N/A 06/22/2014   Procedure: LEFT HEART CATHETERIZATION WITH CORONARY ANGIOGRAM;  Surgeon: Adrian Prows, MD;  Location: [facility] CATH LAB;  Service: Cardiovascular;  Laterality: N/A;   History reviewed. No pertinent family history. Social History:  reports that he has never smoked. He does not have any smokeless tobacco history on file. He reports current alcohol use. He reports that he does not currently use drugs. Allergies:  Allergies  Allergen Reactions   Lisinopril Cough   Penicillin G Hives and Rash   Medications Prior to Admission  Medication Sig Dispense Refill   acetaminophen (TYLENOL) 325 MG tablet Take 2 tablets (650 mg total) by mouth every 6 (six) hours as needed for moderate pain.     albuterol (VENTOLIN HFA) 108 (90 Base) MCG/ACT inhaler Inhale 2 puffs into the lungs every 4 (four) hours as needed for wheezing or shortness of breath.     aspirin EC 81 MG tablet Take 81 mg by mouth daily.     donepezil (ARICEPT) 5 MG tablet Take 5 mg by mouth at bedtime.     glimepiride (AMARYL) 1 MG tablet Take 1 mg by mouth every morning.     hydrOXYzine (ATARAX) 25 MG tablet Take 1 at night as needed for sleep (Patient taking differently: Take 10 mg by mouth at bedtime as needed (sleep). Take 1 at night as needed for sleep) 30 tablet 0  ipratropium-albuterol (DUONEB) 0.5-2.5 (3) MG/3ML SOLN Take 3 mLs by nebulization every 6 (six) hours as needed (SOB).     latanoprost (XALATAN) 0.005 % ophthalmic solution Place 1 drop into both eyes at bedtime.     losartan (COZAAR) 25 MG tablet Take 25 mg by mouth daily.     metFORMIN (GLUCOPHAGE) 500 MG tablet Take 1 tablet (500 mg total) by mouth daily with breakfast. (Patient taking differently: Take 500 mg by mouth 2 (two) times daily.)     metoprolol succinate (TOPROL-XL) 25 MG 24 hr tablet Take 25 mg by  mouth daily.     omeprazole (PRILOSEC) 20 MG capsule Take 20 mg by mouth every other day.     isosorbide mononitrate (IMDUR) 30 MG 24 hr tablet Take 30 mg by mouth daily. (Patient not taking: Reported on 04/09/2022)     predniSONE (DELTASONE) 50 MG tablet Take 1 tablet (50 mg total) by mouth daily. (Patient not taking: Reported on 04/09/2022) 5 tablet 0    Home: Home Living Family/patient expects to be discharged to:: Private residence Living Arrangements: Spouse/significant other Available Help at Discharge: Family, Available 24 hours/day Type of Home: House Home Access: Stairs to enter [employer] of Steps: 3 Entrance Stairs-Rails: Right, Left Home Layout: One level Bathroom Shower/Tub: Chiropodist: Standard Bathroom Accessibility: Yes Home Equipment: Conservation officer, nature (2 wheels), Transport chair, BSC/3in1, Civil engineer, contracting, Grab bars - tub/shower, Hand held shower head  Functional History: Prior Function Prior Level of Function : History of Falls (last six months), Needs assist Mobility Comments: Pt requires bil HHA or RW for mobility and guidance for all standing mobility due to his blindness. Able to ambulate household distances and navigate stairs with guidance. Hx of falls. ADLs Comments: Wife assists with all ADLs due to blindness and dementia. Pt able to assist in dressing. Functional Status:  Mobility: Bed Mobility Overal bed mobility: Needs Assistance Bed Mobility: Supine to Sit Supine to sit: Mod assist, +2 for physical assistance Sit to supine: Max assist, HOB elevated General bed mobility comments: OOB in chair Transfers Overall transfer level: Needs assistance Equipment used: Rolling walker (2 wheels) Transfers: Sit to/from Stand Sit to Stand: Mod assist, +2 physical assistance Bed to/from chair/wheelchair/BSC transfer type:: Stand pivot Stand pivot transfers: Mod assist, +2 physical assistance, +2 safety/equipment Squat pivot transfers: Mod  assist, +2 physical assistance General transfer comment: ModA + 2 to rise from recliner, hand over hand guidance for placing on walker. PT stabilized while pt pulled up Ambulation/Gait Ambulation/Gait assistance: Mod assist, +2 physical assistance, +2 safety/equipment Gait Distance (Feet): 7 Feet Assistive device: Rolling walker (2 wheels) Gait Pattern/deviations: Step-to pattern, Decreased step length - left, Decreased stance time - left, Decreased weight shift to left General Gait Details: Pt with shortened, step to gait, tactile/manual assist for weight shifting and progressing LLE forwards. Verbal cueing for larger R steps and stepping initiation. Close chair follow Gait velocity: decreased Gait velocity interpretation: <1.31 ft/sec, indicative of household ambulator    ADL: ADL Overall ADL's : Needs assistance/impaired Eating/Feeding: Maximal assistance, Sitting Eating/Feeding Details (indicate cue type and reason): secondary to blindness Grooming: Moderate assistance, Wash/dry hands, Wash/dry face, Sitting Upper Body Bathing: Moderate assistance, Sitting Lower Body Bathing: Maximal assistance, Total assistance, Sitting/lateral leans, Sit to/from stand Upper Body Dressing : Moderate assistance, Sitting Lower Body Dressing: Maximal assistance, Total assistance, Sitting/lateral leans, Sit to/from stand, +2 for physical assistance Lower Body Dressing Details (indicate cue type and reason): changing brief out in standing, needing assist  to thread and to doff and don, unable  to complete peri-care in standing Toilet Transfer: Moderate assistance, +2 for physical assistance, +2 for safety/equipment, Stand-pivot, Cueing for sequencing, Cueing for safety Toilet Transfer Details (indicate cue type and reason): simulated with transfer from recliner to bed, cues to pivot feet but noted increased ability to move L foot Toileting- Clothing Manipulation and Hygiene: Total assistance, +2 for physical  assistance, +2 for safety/equipment, Sitting/lateral lean, Sit to/from stand Toileting - Clothing Manipulation Details (indicate cue type and reason): total A for peri care in standing Functional mobility during ADLs: Moderate assistance, +2 for physical assistance, +2 for safety/equipment, Cueing for safety, Cueing for sequencing General ADL Comments: Patient presenting with L sided weakness (more prominent in leg than arm) and need for increased asssit for all aspects of care  Cognition: Cognition Overall Cognitive Status: History of cognitive impairments - at baseline Arousal/Alertness: Awake/alert Orientation Level: Oriented to person Attention: Focused Focused Attention: Appears intact Memory: Impaired Awareness: Impaired Awareness Impairment: Emergent impairment, Intellectual impairment Problem Solving: Impaired Cognition Arousal/Alertness: Awake/alert Behavior During Therapy: WFL for tasks assessed/performed Overall Cognitive Status: History of cognitive impairments - at baseline General Comments: Wife reports he seems at his baseline currently. Pt has dementia and asks questions frequently and fidgets with things often.  Blood pressure 135/69, pulse 75, temperature 97.8 F (36.6 C), temperature source Oral, resp. rate 15, height 5\' 2"  (1.575 m), weight 80.7 kg, SpO2 92 %. Physical Exam Constitutional:      General: He is not in acute distress.    Appearance: He is ill-appearing.     Comments: Wearing bilateral mittens  HENT:     Head: Normocephalic.     Right Ear: External ear normal.     Left Ear: External ear normal.     Nose: Nose normal.     Mouth/Throat:     Mouth: Mucous membranes are moist.  Eyes:     Comments: Blind. Cannot even see light or dark.  Cardiovascular:     Rate and Rhythm: Normal rate.  Pulmonary:     Effort: Pulmonary effort is normal.  Abdominal:     General: There is no distension.     Palpations: Abdomen is soft.  Musculoskeletal:         General: No tenderness.  Skin:    General: Skin is warm and dry.     Findings: Bruising present.  Neurological:     Comments: Oriented to person only. Followed basic commands. Is distracted. Speech dysarthric with mild left facial weakness. Moves all 4 limbs. RUE 4+/5. LUE 4 to 4+/5. RLE 3+ prox to 5/5 distally. LLE 3 prox to 4+ distally. Senses pain in all 4's. No resting abnl tone.   Psychiatric:     Comments: Pleasantly confused.      Results for orders placed or performed during the hospital encounter of 04/09/22 (from the past 24 hour(s))  Glucose, capillary     Status: Abnormal   Collection Time: 04/10/22  5:17 PM  Result Value Ref Range   Glucose-Capillary 126 (H) 70 - 99 mg/dL  Glucose, capillary     Status: Abnormal   Collection Time: 04/10/22  9:21 PM  Result Value Ref Range   Glucose-Capillary 183 (H) 70 - 99 mg/dL   Comment 1 Notify RN    Comment 2 Document in Chart   CBC     Status: Abnormal   Collection Time: 04/11/22  6:09 AM  Result Value Ref Range   WBC 7.2  4.0 - 10.5 K/uL   RBC 4.51 4.22 - 5.81 MIL/uL   Hemoglobin 11.4 (L) 13.0 - 17.0 g/dL   HCT 47.0 (L) 96.2 - 83.6 %   MCV 78.0 (L) 80.0 - 100.0 fL   MCH 25.3 (L) 26.0 - 34.0 pg   MCHC 32.4 30.0 - 36.0 g/dL   RDW 62.9 47.6 - 54.6 %   Platelets 178 150 - 400 K/uL   nRBC 0.0 0.0 - 0.2 %  Comprehensive metabolic panel     Status: Abnormal   Collection Time: 04/11/22  6:09 AM  Result Value Ref Range   Sodium 136 135 - 145 mmol/L   Potassium 4.0 3.5 - 5.1 mmol/L   Chloride 105 98 - 111 mmol/L   CO2 20 (L) 22 - 32 mmol/L   Glucose, Bld 131 (H) 70 - 99 mg/dL   BUN 14 8 - 23 mg/dL   Creatinine, Ser 5.03 (H) 0.61 - 1.24 mg/dL   Calcium 8.8 (L) 8.9 - 10.3 mg/dL   Total Protein 6.7 6.5 - 8.1 g/dL   Albumin 3.7 3.5 - 5.0 g/dL   AST 30 15 - 41 U/L   ALT 17 0 - 44 U/L   Alkaline Phosphatase 62 38 - 126 U/L   Total Bilirubin 0.4 0.3 - 1.2 mg/dL   GFR, Estimated 44 (L) >60 mL/min   Anion gap 11 5 - 15  Magnesium      Status: None   Collection Time: 04/11/22  6:09 AM  Result Value Ref Range   Magnesium 1.8 1.7 - 2.4 mg/dL  Glucose, capillary     Status: Abnormal   Collection Time: 04/11/22  6:12 AM  Result Value Ref Range   Glucose-Capillary 108 (H) 70 - 99 mg/dL   Comment 1 Notify RN    Comment 2 Document in Chart   Glucose, capillary     Status: Abnormal   Collection Time: 04/11/22 11:25 AM  Result Value Ref Range   Glucose-Capillary 194 (H) 70 - 99 mg/dL   ECHOCARDIOGRAM COMPLETE  Result Date: 04/10/2022    ECHOCARDIOGRAM REPORT   Patient Name:   Grant Jensen. Date of Exam: 04/10/2022 Medical Rec #:  [id number]          Height:       67.0 in Accession #:    [id number]         Weight:       156.5 lb Date of Birth:  16-Feb-1929          BSA:          1.822 m Patient Age:    89 years           BP:           107/51 mmHg Patient Gender: M                  HR:           72 bpm. Exam Location:  Inpatient Procedure: 2D Echo, Cardiac Doppler and Color Doppler Indications:    Stroke I63.9  History:        Patient has no prior history of Echocardiogram examinations.                 Cardiomyopathy, Signs/Symptoms:Chest Pain; Risk                 Factors:Hypertension and Diabetes.  Sonographer:    Lucendia Herrlich Referring Phys: [id number] Lyn Hollingshead B MELVIN IMPRESSIONS  1. Left ventricular  ejection fraction, by estimation, is 60 to 65%. The left ventricle has normal function. The left ventricle has no regional wall motion abnormalities. There is mild concentric left ventricular hypertrophy. Left ventricular diastolic parameters are consistent with Grade I diastolic dysfunction (impaired relaxation).  2. Right ventricular systolic function is normal. The right ventricular size is normal.  3. The mitral valve is degenerative. Mild mitral valve regurgitation. No evidence of mitral stenosis. Moderate mitral annular calcification.  4. The aortic valve is tricuspid. There is moderate calcification of the aortic valve. Aortic  valve regurgitation is not visualized. No aortic stenosis is present.  5. The inferior vena cava is normal in size with greater than 50% respiratory variability, suggesting right atrial pressure of 3 mmHg. FINDINGS  Left Ventricle: Left ventricular ejection fraction, by estimation, is 60 to 65%. The left ventricle has normal function. The left ventricle has no regional wall motion abnormalities. The left ventricular internal cavity size was normal in size. There is  mild concentric left ventricular hypertrophy. Left ventricular diastolic parameters are consistent with Grade I diastolic dysfunction (impaired relaxation). Right Ventricle: The right ventricular size is normal. No increase in right ventricular wall thickness. Right ventricular systolic function is normal. Left Atrium: Left atrial size was normal in size. Right Atrium: Right atrial size was normal in size. Pericardium: There is no evidence of pericardial effusion. Mitral Valve: The mitral valve is degenerative in appearance. There is mild calcification of the mitral valve leaflet(s). Moderate mitral annular calcification. Mild mitral valve regurgitation. No evidence of mitral valve stenosis. Tricuspid Valve: The tricuspid valve is normal in structure. Tricuspid valve regurgitation is trivial. No evidence of tricuspid stenosis. Aortic Valve: The aortic valve is tricuspid. There is moderate calcification of the aortic valve. Aortic valve regurgitation is not visualized. No aortic stenosis is present. Aortic valve mean gradient measures 8.5 mmHg. Aortic valve peak gradient measures 15.1 mmHg. Aortic valve area, by VTI measures 2.12 cm. Pulmonic Valve: The pulmonic valve was normal in structure. Pulmonic valve regurgitation is trivial. No evidence of pulmonic stenosis. Aorta: The aortic root is normal in size and structure. Venous: The inferior vena cava was not well visualized. The inferior vena cava is normal in size with greater than 50% respiratory  variability, suggesting right atrial pressure of 3 mmHg. IAS/Shunts: No atrial level shunt detected by color flow Doppler.  LEFT VENTRICLE PLAX 2D LVIDd:         3.20 cm   Diastology LVIDs:         2.50 cm   LV e' medial:    4.90 cm/s LV PW:         1.50 cm   LV E/e' medial:  18.6 LV IVS:        0.90 cm   LV e' lateral:   6.53 cm/s LVOT diam:     2.00 cm   LV E/e' lateral: 14.0 LV SV:         85 LV SV Index:   46 LVOT Area:     3.14 cm  RIGHT VENTRICLE RV S prime:     5.66 cm/s TAPSE (M-mode): 1.2 cm LEFT ATRIUM             Index LA diam:        3.10 cm 1.70 cm/m LA Vol (A2C):   28.3 ml 15.53 ml/m LA Vol (A4C):   22.9 ml 12.57 ml/m LA Biplane Vol: 25.6 ml 14.05 ml/m  AORTIC VALVE AV Area (Vmax):    2.23  cm AV Area (Vmean):   2.14 cm AV Area (VTI):     2.12 cm AV Vmax:           194.00 cm/s AV Vmean:          133.500 cm/s AV VTI:            0.399 m AV Peak Grad:      15.1 mmHg AV Mean Grad:      8.5 mmHg LVOT Vmax:         138.00 cm/s LVOT Vmean:        90.900 cm/s LVOT VTI:          0.270 m LVOT/AV VTI ratio: 0.67  AORTA Ao Root diam: 3.40 cm Ao Asc diam:  3.40 cm MITRAL VALVE MV Area (PHT): 3.08 cm     SHUNTS MV Decel Time: 246 msec     Systemic VTI:  0.27 m MV E velocity: 91.20 cm/s   Systemic Diam: 2.00 cm MV A velocity: 162.00 cm/s MV E/A ratio:  0.56 [name] MD Electronically signed by [name] MD Signature Date/Time: 04/10/2022/4:51:44 PM    Final    EEG adult  Result Date: 04/09/2022 Derek Jack, MD     04/09/2022  7:49 PM Routine EEG Report Grant Jensen. is a 89 y.o. male with a history of spells and altered mental status who is undergoing an EEG to evaluate for seizures. Report: This EEG was acquired with electrodes placed according to the International 10-20 electrode system (including Fp1, Fp2, F3, F4, C3, C4, P3, P4, O1, O2, T3, T4, T5, T6, A1, A2, Fz, Cz, Pz). The following electrodes were missing or displaced: none. The occipital dominant rhythm was 6-8 Hz. This  activity is reactive to stimulation. Drowsiness was manifested by background fragmentation; deeper stages of sleep were not identified. There was no focal slowing. There were no interictal epileptiform discharges. There were no electrographic seizures identified. Hyperventilation and photic stimulation were not performed. Impression and clinical correlation: This EEG was obtained while awake and drowsy and is abnormal due to mild diffuse slowing indicative of global cerebral dysfunction. Epileptiform abnormalities were not seen during this recording. Su Monks, MD [facility] [phone] If 7pm- 7am, please page neurology on call as listed in [place].   MR BRAIN WO CONTRAST  Result Date: 04/09/2022 CLINICAL DATA:  Left-sided weakness EXAM: MRI HEAD WITHOUT CONTRAST TECHNIQUE: Multiplanar, multiecho pulse sequences of the brain and surrounding structures were obtained without intravenous contrast. COMPARISON:  Same day CT stroke code and CTA head/neck angiogram. FINDINGS: Brain: Small regions of acute infarct in the periventricular right posterior frontal lobe/corona radiata (series 4, image 40). Evidence of hemorrhage. There is sequela of moderate chronic microvascular ischemic change. No hemorrhage. Advanced generalized volume loss. Vascular: Normal flow voids. Skull and upper cervical spine: Redemonstrated is mild narrowing at the craniocervical junction. Sinuses/Orbits: Bilateral lens replacement. Paranasal sinuses are clear. Other: None. IMPRESSION: Small foci of acute infarct in the right periventricular posterior frontal lobe/corona radiata. No hemorrhage or mass effect. Electronically Signed   By: Marin Roberts M.D.   On: 04/09/2022 15:26    Assessment/Plan: Diagnosis: Acute right periventricular posterior frontal lobe infarct due to small vessel disease with left hemiparesis. Does the need for close, 24 hr/day medical supervision in concert with the patient's rehab needs make it  unreasonable for this patient to be served in a less intensive setting? Yes Co-Morbidities requiring supervision/potential complications: Legally blind due to glaucoma, hypertension, uncontrolled diabetes,  dementia. Due to bladder management, bowel management, safety, skin/wound care, disease management, medication administration, pain management, and patient education, does the patient require 24 hr/day rehab nursing? Yes Does the patient require coordinated care of a physician, rehab nurse, therapy disciplines of PT, OT, +/- SLP to address physical and functional deficits in the context of the above medical diagnosis(es)? Yes Addressing deficits in the following areas: balance, endurance, locomotion, strength, transferring, bowel/bladder control, bathing, dressing, feeding, grooming, toileting, cognition, speech, and psychosocial support Can the patient actively participate in an intensive therapy program of at least 3 hrs of therapy per day at least 5 days per week? Yes The potential for patient to make measurable gains while on inpatient rehab is good Anticipated functional outcomes upon discharge from inpatient rehab are min assist  with PT, min assist with OT, min assist and max assist with SLP. Estimated rehab length of stay to reach the above functional goals is: 7-10 days Anticipated discharge destination: Home Overall Rehab/Functional Prognosis: good  POST ACUTE RECOMMENDATIONS: This patient's condition is appropriate for continued rehabilitative care in the following setting: CIR Patient's wife has agreed to participate in recommended program. Yes Note that insurance prior authorization may be required for reimbursement for recommended care.  Comment: Grant Jensen had care needs at baseline.  However he was physically able to assist with transfers and gait which made his care manageable for his wife.  He was attending adult daycare 3 times a week and out in the community with his wife.   Since he has had a stroke, his difficulty with his left-sided coordination and initiation has impacted those abilities.  I think he could benefit from a 7 to 10-day inpatient rehab admission to address his lower extremity strength and functional mobility so that he and his wife are more easily able to manage at home.  He is a willing and able participant in therapy.  Wife is very motivated and engaged and will be part of the process also.  Rehab Admissions Coordinator to follow up.  Thanks,  Ranelle Oyster, MD 04/11/2022

## 2022-04-11 NOTE — Progress Notes (Addendum)
  Progress Note   Patient: Grant Jensen. HMC:[id number] DOB: 10/19/1928 DOA: 04/09/2022     2 DOS: the patient was seen and examined on 04/11/2022   Brief hospital course:  Assessment and Plan:  Acute CVA  - ASA 81 mg PO daily  - Plavix 75 mg PO daily  - ECHO 60-65%, no WMA's, grade I diastolic dysfunction  - Disposition per case manager (PT/OT recommended inpt rehab)   HTN  - Monitor    DM uncontrolled (A1c 10.1) - Novolog SS tid w/ meals    Dementia - Aricept 5 mg PO daily  - Atarax 10 mg PO PRN    Glaucoma - Per documentation, pt declines to take eye drops   6. Obesity BMI ? 30 - 39.9 - Obesity  (32 kg/m2) - Complicating care   7. CKD Stage 3b - GFR 30 to 44 (Moderately to severely decreased)  - Complicating care    DVT prophylaxis: SCDs       Subjective: Pt seen and examined at the bedside. He remains stable. Disposition shall be inpatient rehab. Case manager is assisting with this.   Physical Exam: Vitals:   04/10/22 2000 04/11/22 0000 04/11/22 0400 04/11/22 0719  BP: (!) 144/67 128/74 138/73 (!) 148/79  Pulse: 68 70 85 82  Resp: 16 16 16 16   Temp: 98 F (36.7 C) 97.9 F (36.6 C) 98.6 F (37 C) 98.4 F (36.9 C)  TempSrc: Axillary Axillary Axillary Axillary  SpO2: 100% 97% 92%   Weight:      Height:       Constitutional:      Appearance: Normal appearance.  HENT:     Head: Normocephalic.     Mouth/Throat:     Mouth: Mucous membranes are moist.  Cardiovascular:     Rate and Rhythm: Normal rate and regular rhythm.  Pulmonary:     Effort: Pulmonary effort is normal.  Abdominal:     General: Abdomen is flat.     Palpations: Abdomen is soft.  Skin:    General: Skin is warm.  Neurological:     Mental Status: He is alert. Mental status is at baseline.  Psychiatric:        Mood and Affect: Mood normal.   Data Reviewed:   Disposition: Status is: Inpatient  Planned Discharge Destination: Rehab    Time spent: 35  minutes  Author: Lucienne Minks , MD 04/11/2022 11:05 AM  For on call review [email or web address].

## 2022-04-11 NOTE — Progress Notes (Signed)
Physical Therapy Treatment Patient Details Name: Grant Jensen. MRN: [id number] DOB: 1928-10-02 Today's Date: 04/11/2022   History of Present Illness Pt is a 89 y.o. male who presented 04/09/22 with left sided weakness and dysarthria. Outside window for TNK. CT head shows no acute abnormality. Awaiting brain MRI. PMH: dementia, DM, blindness due to glaucoma    PT Comments    Pt progressing towards his physical therapy goals. Received up in chair and agreeable to participate. Requiring two person moderate assist for functional mobility. Ambulating limited room distances with a RW and close chair follow. Manual assist/tactile facilitation for weight shifting and bringing LLE forward. Continue to recommend acute inpatient rehab (AIR) for post-acute therapy needs.    Recommendations for follow up therapy are one component of a multi-disciplinary discharge planning process, led by the attending physician.  Recommendations may be updated based on patient status, additional functional criteria and insurance authorization.  Follow Up Recommendations  Acute inpatient rehab (3hours/day)     Assistance Recommended at Discharge Frequent or constant Supervision/Assistance  Patient can return home with the following Two people to help with walking and/or transfers;A lot of help with bathing/dressing/bathroom;Assistance with cooking/housework;Direct supervision/assist for financial management;Direct supervision/assist for medications management;Assist for transportation;Help with stairs or ramp for entrance   Equipment Recommendations  Hospital bed;Other (comment) (hoyer lift and pad-pending progress)    Recommendations for Other Services Rehab consult     Precautions / Restrictions Precautions Precautions: Fall;Other (comment) Precaution Comments: blind, mitts Restrictions Weight Bearing Restrictions: No     Mobility  Bed Mobility               General bed mobility comments: OOB in  chair    Transfers Overall transfer level: Needs assistance Equipment used: Rolling walker (2 wheels) Transfers: Sit to/from Stand Sit to Stand: Mod assist, +2 physical assistance           General transfer comment: ModA + 2 to rise from recliner, hand over hand guidance for placing on walker. PT stabilized while pt pulled up    Ambulation/Gait Ambulation/Gait assistance: Mod assist, +2 physical assistance, +2 safety/equipment Gait Distance (Feet): 7 Feet Assistive device: Rolling walker (2 wheels) Gait Pattern/deviations: Step-to pattern, Decreased step length - left, Decreased stance time - left, Decreased weight shift to left Gait velocity: decreased Gait velocity interpretation: <1.31 ft/sec, indicative of household ambulator   General Gait Details: Pt with shortened, step to gait, tactile/manual assist for weight shifting and progressing LLE forwards. Verbal cueing for larger R steps and stepping initiation. Close chair follow   Stairs             Wheelchair Mobility    Modified Rankin (Stroke Patients Only) Modified Rankin (Stroke Patients Only) Pre-Morbid Rankin Score: Moderately severe disability Modified Rankin: Moderately severe disability     Balance Overall balance assessment: Needs assistance Sitting-balance support: Single extremity supported, Feet supported Sitting balance-Leahy Scale: Fair     Standing balance support: Bilateral upper extremity supported, During functional activity, Reliant on assistive device for balance Standing balance-Leahy Scale: Poor Standing balance comment: Reliant on UE support, bil knee block,                            Cognition Arousal/Alertness: Awake/alert Behavior During Therapy: WFL for tasks assessed/performed Overall Cognitive Status: History of cognitive impairments - at baseline  General Comments: Wife reports he seems at his baseline currently. Pt  has dementia and asks questions frequently and fidgets with things often.        Exercises General Exercises - Lower Extremity Ankle Circles/Pumps: AAROM, Both, 10 reps, Seated Long Arc Quad: AAROM, Both, 10 reps, Seated    General Comments        Pertinent Vitals/Pain Pain Assessment Pain Assessment: Faces Faces Pain Scale: No hurt    Home Living                          Prior Function            PT Goals (current goals can now be found in the care plan section) Acute Rehab PT Goals Patient Stated Goal: did not state; wife hopeful for pt to improve PT Goal Formulation: With patient/family Time For Goal Achievement: 04/23/22 Potential to Achieve Goals: Fair Progress towards PT goals: Progressing toward goals    Frequency    Min 3X/week      PT Plan Current plan remains appropriate    Co-evaluation              AM-PAC PT "6 Clicks" Mobility   Outcome Measure  Help needed turning from your back to your side while in a flat bed without using bedrails?: A Lot Help needed moving from lying on your back to sitting on the side of a flat bed without using bedrails?: A Lot Help needed moving to and from a bed to a chair (including a wheelchair)?: A Lot Help needed standing up from a chair using your arms (e.g., wheelchair or bedside chair)?: A Lot Help needed to walk in hospital room?: Total Help needed climbing 3-5 steps with a railing? : Total 6 Click Score: 10    End of Session Equipment Utilized During Treatment: Gait belt Activity Tolerance: Patient tolerated treatment well Patient left: with call bell/phone within reach;in chair;with chair alarm set;with family/visitor present Nurse Communication: Mobility status PT Visit Diagnosis: Unsteadiness on feet (R26.81);Other abnormalities of gait and mobility (R26.89);Repeated falls (R29.6);Muscle weakness (generalized) (M62.81);History of falling (Z91.81);Difficulty in walking, not elsewhere  classified (R26.2);Other symptoms and signs involving the nervous system (R29.898)     Time: 1004-1020 PT Time Calculation (min) (ACUTE ONLY): 16 min  Charges:  $Therapeutic Activity: 8-22 mins                     Wyona Almas, PT, DPT Acute Rehabilitation Services Office [place] 04/11/2022, 12:14 PM

## 2022-04-11 NOTE — Progress Notes (Addendum)
  Inpatient Rehabilitation Admissions Coordinator   Met with patient and wife at bedside for rehab assessment. We discussed goals and expectations of a possible CIR admit. He was at SNF 2 years ago, [facility], and wife prefers CIR for rehab. Wife transports patient 2 to 3 times per week to [facility] [facility] memory care program from  9 am until 3 to  4 pm. She provides 24/7 min assist. I will begin insurance Auth with [employer] for possible CIR admit pending approval.  Dr Naaman Plummer consulted. Please call me with any questions.   Danne Baxter, RN, MSN Rehab Admissions Coordinator [phone]

## 2022-04-12 DIAGNOSIS — I639 Cerebral infarction, unspecified: Secondary | ICD-10-CM | POA: Diagnosis not present

## 2022-04-12 LAB — GLUCOSE, CAPILLARY
Glucose-Capillary: 102 mg/dL — ABNORMAL HIGH (ref 70–99)
Glucose-Capillary: 207 mg/dL — ABNORMAL HIGH (ref 70–99)
Glucose-Capillary: 152 mg/dL — ABNORMAL HIGH (ref 70–99)
Glucose-Capillary: 149 mg/dL — ABNORMAL HIGH (ref 70–99)

## 2022-04-12 NOTE — Progress Notes (Signed)
Inpatient Rehabilitation Admissions Coordinator   I have insurance approval for CIR admit but no bed available today. Hopeful will have bed in next 48 hrs. I met with wife at bedside and she is aware and in agreement. I will follow up tomorrow. Acute team and TOC made aware.  Danne Baxter, RN, MSN Rehab Admissions Coordinator [phone] 04/12/2022 10:50 AM

## 2022-04-12 NOTE — Care Management Important Message (Signed)
Important Message  Patient Details  Name: Grant Jensen. MRN: [id number] Date of Birth: 06/02/1928   Medicare Important Message Given:  Yes     Hannah Beat 04/12/2022, 1:33 PM

## 2022-04-12 NOTE — Progress Notes (Signed)
Occupational Therapy Treatment Patient Details Name: Grant Jensen. MRN: [id number] DOB: [birth year] Today's Date: 04/12/2022   History of present illness Pt is a 89 y.o. male who presented 04/09/22 with left sided weakness and dysarthria. Outside window for TNK. CT head shows no acute abnormality. Awaiting brain MRI. PMH: dementia, DM, blindness due to glaucoma   OT comments  Patient continues to make steady progress towards goals in skilled OT session. Patient's session encompassed  sit<>stands and functional mobility to increase overall activity tolerance. Patient mod A of 2 to come into standing, though improved advancement of LLE to complete shuffled steps x2 with max encouragement. OT continues to recommend AIR placement; will continue to follow.     Recommendations for follow up therapy are one component of a multi-disciplinary discharge planning process, led by the attending physician.  Recommendations may be updated based on patient status, additional functional criteria and insurance authorization.    Follow Up Recommendations  Acute inpatient rehab (3hours/day)     Assistance Recommended at Discharge Frequent or constant Supervision/Assistance  Patient can return home with the following  Two people to help with walking and/or transfers;A lot of help with bathing/dressing/bathroom;Assistance with cooking/housework;Assistance with feeding;Direct supervision/assist for medications management;Direct supervision/assist for financial management;Assist for transportation;Help with stairs or ramp for entrance   Equipment Recommendations  Other (comment) (defer to next venue)    Recommendations for Other Services      Precautions / Restrictions Precautions Precautions: Fall;Other (comment) Precaution Comments: blind, mitts Restrictions Weight Bearing Restrictions: No       Mobility Bed Mobility               General bed mobility comments: OOB in chair     Transfers Overall transfer level: Needs assistance Equipment used: Rolling walker (2 wheels) Transfers: Sit to/from Stand Sit to Stand: Mod assist, +2 physical assistance           General transfer comment: ModA + 2 to rise from recliner, patient able to complete sit<>stands x3, with shuffled gait x2     Balance Overall balance assessment: Needs assistance Sitting-balance support: Single extremity supported, Feet supported Sitting balance-Leahy Scale: Fair     Standing balance support: Bilateral upper extremity supported, During functional activity, Reliant on assistive device for balance Standing balance-Leahy Scale: Poor Standing balance comment: Reliant on UE support                           ADL either performed or assessed with clinical judgement   ADL Overall ADL's : Needs assistance/impaired Eating/Feeding: Maximal assistance;Sitting Eating/Feeding Details (indicate cue type and reason): secondary to blindness Grooming: Wash/dry hands;Wash/dry face;Sitting;Minimal Scientist, research (physical sciences): Moderate assistance;+2 for physical assistance;+2 for safety/equipment;Stand-pivot;Cueing for sequencing;Cueing for safety Toilet Transfer Details (indicate cue type and reason): simulated with sit<>stand transfers, cues to advance feet but noted increased ability to move L foot         Functional mobility during ADLs: Moderate assistance;+2 for physical assistance;+2 for safety/equipment;Cueing for safety;Cueing for sequencing General ADL Comments: Session focus on sit<>stands and functional mobility to increase overall activity tolerance    Extremity/Trunk Assessment              Vision       Perception     Praxis      Cognition Arousal/Alertness: Awake/alert Behavior During Therapy: [facility] for tasks  assessed/performed Overall Cognitive Status: History of cognitive impairments - at baseline                                  General Comments: Wife reports he seems at his baseline currently. Pt has dementia and asks questions frequently and fidgets with things often.        Exercises      Shoulder Instructions       General Comments      Pertinent Vitals/ Pain       Pain Assessment Pain Assessment: No/denies pain  Home Living                                          Prior Functioning/Environment              Frequency  Min 2X/week        Progress Toward Goals  OT Goals(current goals can now be found in the care plan section)  Progress towards OT goals: Progressing toward goals  Acute Rehab OT Goals Patient Stated Goal: per wife for patient to improve to take home OT Goal Formulation: With patient/family Time For Goal Achievement: 04/24/22 Potential to Achieve Goals: Good  Plan Discharge plan remains appropriate    Co-evaluation                 AM-PAC OT "6 Clicks" Daily Activity     Outcome Measure   Help from another person eating meals?: A Lot Help from another person taking care of personal grooming?: A Little Help from another person toileting, which includes using toliet, bedpan, or urinal?: Total Help from another person bathing (including washing, rinsing, drying)?: A Lot Help from another person to put on and taking off regular upper body clothing?: A Lot Help from another person to put on and taking off regular lower body clothing?: Total 6 Click Score: 11    End of Session Equipment Utilized During Treatment: Gait belt;Rolling walker (2 wheels)  OT Visit Diagnosis: Unsteadiness on feet (R26.81);Other abnormalities of gait and mobility (R26.89);Repeated falls (R29.6);Muscle weakness (generalized) (M62.81);History of falling (Z91.81);Other symptoms and signs involving cognitive function;Hemiplegia and hemiparesis Hemiplegia - Right/Left: Left Hemiplegia - dominant/non-dominant: Non-Dominant Hemiplegia - caused by: Cerebral  infarction   Activity Tolerance Patient tolerated treatment well   Patient Left in chair;with call bell/phone within reach;with chair alarm set;with family/visitor present   Nurse Communication Mobility status        Time: 1610-9604 OT Time Calculation (min): 23 min  Charges: OT General Charges $OT Visit: 1 Visit OT Treatments $Self Care/Home Management : 23-37 mins  [place]. Panhia Karl, OTR/L Acute Rehabilitation Services [place] 04/12/2022, 4:27 PM

## 2022-04-12 NOTE — Progress Notes (Signed)
Mobility Specialist: Progress Note   04/12/22 1222  Mobility  Activity Ambulated with assistance in room  Level of Assistance +2 (takes two people)  Assistive Device Front wheel walker  Distance Ambulated (ft) 12 ft (6'x2)  Activity Response Tolerated well  Mobility Referral Yes  $Mobility charge 1 Mobility   Pt received in the chair, reluctant but agreeable to mobility. ModA to stand from the chair with verbal and tactile cues for hand placement and BLE advancement. C/o back pain during ambulation, no rating given. Pt back to the chair after session with call bell in his lap. Chair alarm is on.   [place] Volney Reierson Mobility Specialist Please contact via SecureChat or Rehab office at [phone]

## 2022-04-12 NOTE — Progress Notes (Signed)
  Progress Note   Patient: Grant Jensen. NWG:[id number] DOB: [birth year] DOA: 04/09/2022     3 DOS: the patient was seen and examined on 04/12/2022   Brief hospital course:  Assessment and Plan: Acute CVA  - ASA 81 mg PO daily  - Plavix 75 mg PO daily  - Crestor 20 mg PO daily  - ECHO 60-65%, no WMA's, grade I diastolic dysfunction  - Disposition per case manager (PT/OT recommended inpt rehab)   HTN  - Monitor    DM uncontrolled (A1c 10.1) - Novolog SS tid w/ meals    Dementia - Aricept 5 mg PO daily  - Atarax 10 mg PO PRN    Glaucoma - Per documentation, pt declines to take eye drops    6. Obesity BMI ? 30 - 39.9 - Obesity  (32 kg/m2) - Complicating care    7. CKD Stage 3b - GFR 30 to 44 (Moderately to severely decreased)  - Complicating care    DVT prophylaxis: SCDs     Subjective: Pt seen and examined at the bedside. Pt has insurance auth for inpatient rehab, however, there is no bed at this time. [facility] anticipates an open bed in the next 48hr.  Physical Exam: Vitals:   04/11/22 2325 04/12/22 0312 04/12/22 0741 04/12/22 1149  BP: 119/69 125/61 (!) 136/92 130/61  Pulse: 71 69 67 76  Resp: 18 18 18 16   Temp: 98.2 F (36.8 C) 97.7 F (36.5 C) 98.6 F (37 C) 98 F (36.7 C)  TempSrc: Oral Oral Oral   SpO2: 100% 100% 100% 100%  Weight:      Height:       Constitutional:      Appearance: Normal appearance.  HENT:     Head: Normocephalic.     Mouth/Throat:     Mouth: Mucous membranes are moist.  Cardiovascular:     Rate and Rhythm: Normal rate and regular rhythm.  Pulmonary:     Effort: Pulmonary effort is normal.  Abdominal:     General: Abdomen is flat.     Palpations: Abdomen is soft.  Skin:    General: Skin is warm.  Neurological:     Mental Status: He is alert. Mental status is at baseline.  Psychiatric:        Mood and Affect: Mood normal.   Data Reviewed:   Disposition: Status is: Inpatient  Planned Discharge Destination:  Rehab    Time spent: 35 minutes  Author: Lucienne Minks , MD 04/12/2022 12:48 PM  For on call review [email or web address].

## 2022-04-13 DIAGNOSIS — I639 Cerebral infarction, unspecified: Secondary | ICD-10-CM | POA: Diagnosis not present

## 2022-04-13 LAB — COMPREHENSIVE METABOLIC PANEL WITH GFR
ALT: 17 U/L (ref 0–44)
AST: 28 U/L (ref 15–41)
Albumin: 3.5 g/dL (ref 3.5–5.0)
Alkaline Phosphatase: 52 U/L (ref 38–126)
Anion gap: 10 (ref 5–15)
BUN: 17 mg/dL (ref 8–23)
CO2: 23 mmol/L (ref 22–32)
Calcium: 9 mg/dL (ref 8.9–10.3)
Chloride: 103 mmol/L (ref 98–111)
Creatinine, Ser: 1.69 mg/dL — ABNORMAL HIGH (ref 0.61–1.24)
GFR, Estimated: 38 mL/min — ABNORMAL LOW
Glucose, Bld: 125 mg/dL — ABNORMAL HIGH (ref 70–99)
Potassium: 4.2 mmol/L (ref 3.5–5.1)
Sodium: 136 mmol/L (ref 135–145)
Total Bilirubin: 0.5 mg/dL (ref 0.3–1.2)
Total Protein: 6.1 g/dL — ABNORMAL LOW (ref 6.5–8.1)

## 2022-04-13 LAB — GLUCOSE, CAPILLARY
Glucose-Capillary: 108 mg/dL — ABNORMAL HIGH (ref 70–99)
Glucose-Capillary: 135 mg/dL — ABNORMAL HIGH (ref 70–99)
Glucose-Capillary: 184 mg/dL — ABNORMAL HIGH (ref 70–99)
Glucose-Capillary: 206 mg/dL — ABNORMAL HIGH (ref 70–99)

## 2022-04-13 LAB — MAGNESIUM: Magnesium: 1.9 mg/dL (ref 1.7–2.4)

## 2022-04-13 NOTE — H&P (Signed)
Physical Medicine and Rehabilitation Admission H&P     HPI: Grant Jensen. Grant, Jensen. is a 89 year old right-handed male with history significant for hypertension, CKD stage III, diabetes melitis with retinopathy/blindness since 1994, nonischemic cardiomyopathy maintained on low-dose aspirin, dementia maintained on Aricept.  Per chart review patient lives with spouse.  He does attend adult daycare 3 days a week.  1 level home 3 steps to entry.  Required bilateral hand-held assist versus rolling walker for mobility and guidance for all standing mobility due to his blindness.  Wife assist with ADLs.  Presented 04/09/2022 with acute onset of left-sided weakness and slurred speech.  Cranial CT scan negative.  CT angiogram head and neck one of the proximal M2 branches on the right severely stenosed to nearly occluded.  Moderate focal stenosis in the P2 segment of the right PCA.  MRI showed small foci of acute infarct in the right periventricular posterior frontal lobe/corona radiata.  No hemorrhage or mass effect.  EEG performed showed mild diffuse slowing no seizure.  Echocardiogram with ejection fraction of 60 to 65% no wall motion abnormalities grade 1 diastolic dysfunction.  Patient did not receive tPA.  Admission chemistries unremarkable except glucose 162, creatinine 1.64, hemoglobin A1c 10.1.  Neurology follow-up placed on low-dose aspirin as well as Plavix for CVA prophylaxis x 3 weeks then Plavix alone.  Tolerating a regular consistency diet.  Therapy evaluations completed due to patient decreased functional mobility and left-sided weakness was admitted for a comprehensive rehab program.  Review of Systems  Constitutional:  Negative for chills and fever.  HENT:  Negative for hearing loss.   Eyes:        Total blindness  Respiratory:  Negative for cough and shortness of breath.   Cardiovascular:  Positive for leg swelling. Negative for chest pain and palpitations.  Gastrointestinal:  Positive for  constipation. Negative for heartburn, nausea and vomiting.  Genitourinary:  Positive for urgency. Negative for dysuria, flank pain and hematuria.  Musculoskeletal:  Positive for falls, joint pain and myalgias.  Skin:  Negative for rash.  Neurological:  Positive for speech change and weakness.  Psychiatric/Behavioral:  Positive for memory loss.   All other systems reviewed and are negative.  Past Medical History:  Diagnosis Date   Blindness    Dementia ([place])    Diabetes mellitus without complication ([place])    Glaucoma    Hypertension    Past Surgical History:  Procedure Laterality Date   LEFT HEART CATHETERIZATION WITH CORONARY ANGIOGRAM N/A 06/22/2014   Procedure: LEFT HEART CATHETERIZATION WITH CORONARY ANGIOGRAM;  Surgeon: Adrian Prows, MD;  Location: [facility] CATH LAB;  Service: Cardiovascular;  Laterality: N/A;   History reviewed. No pertinent family history. Social History:  reports that he has never smoked. He does not have any smokeless tobacco history on file. He reports current alcohol use. He reports that he does not currently use drugs. Allergies:  Allergies  Allergen Reactions   Lisinopril Cough   Penicillin G Hives and Rash   Medications Prior to Admission  Medication Sig Dispense Refill   acetaminophen (TYLENOL) 325 MG tablet Take 2 tablets (650 mg total) by mouth every 6 (six) hours as needed for moderate pain.     albuterol (VENTOLIN HFA) 108 (90 Base) MCG/ACT inhaler Inhale 2 puffs into the lungs every 4 (four) hours as needed for wheezing or shortness of breath.     aspirin EC 81 MG tablet Take 81 mg by mouth daily.     donepezil (ARICEPT)  5 MG tablet Take 5 mg by mouth at bedtime.     glimepiride (AMARYL) 1 MG tablet Take 1 mg by mouth every morning.     hydrOXYzine (ATARAX) 25 MG tablet Take 1 at night as needed for sleep (Patient taking differently: Take 10 mg by mouth at bedtime as needed (sleep). Take 1 at night as needed for sleep) 30 tablet 0   ipratropium-albuterol  (DUONEB) 0.5-2.5 (3) MG/3ML SOLN Take 3 mLs by nebulization every 6 (six) hours as needed (SOB).     latanoprost (XALATAN) 0.005 % ophthalmic solution Place 1 drop into both eyes at bedtime.     losartan (COZAAR) 25 MG tablet Take 25 mg by mouth daily.     metFORMIN (GLUCOPHAGE) 500 MG tablet Take 1 tablet (500 mg total) by mouth daily with breakfast. (Patient taking differently: Take 500 mg by mouth 2 (two) times daily.)     metoprolol succinate (TOPROL-XL) 25 MG 24 hr tablet Take 25 mg by mouth daily.     omeprazole (PRILOSEC) 20 MG capsule Take 20 mg by mouth every other day.     isosorbide mononitrate (IMDUR) 30 MG 24 hr tablet Take 30 mg by mouth daily. (Patient not taking: Reported on 04/09/2022)     predniSONE (DELTASONE) 50 MG tablet Take 1 tablet (50 mg total) by mouth daily. (Patient not taking: Reported on 04/09/2022) 5 tablet 0      Home: Home Living Family/patient expects to be discharged to:: Private residence Living Arrangements: Spouse/significant other Available Help at Discharge: Family, Available 24 hours/day Type of Home: House Home Access: Stairs to enter [employer] of Steps: 3 Entrance Stairs-Rails: Right, Left Home Layout: One level Bathroom Shower/Tub: Chiropodist: Standard Bathroom Accessibility: Yes Home Equipment: Conservation officer, nature (2 wheels), Transport chair, BSC/3in1, Civil engineer, contracting, Grab bars - tub/shower, Hand held shower head Additional Comments: goes to [employer] day care 2 to 3 times per week ; paid by DSS; wife transports  Lives With: Spouse (of 20 years)   Functional History: Prior Function Prior Level of Function : History of Falls (last six months), Needs assist Mobility Comments: Pt requires bil HHA or RW for mobility and guidance for all standing mobility due to his blindness. Able to ambulate household distances and navigate stairs with guidance. Hx of falls. ADLs Comments: Wife assists with all ADLs due to  blindness and dementia. Pt able to assist in dressing.  Functional Status:  Mobility: Bed Mobility Overal bed mobility: Needs Assistance Bed Mobility: Supine to Sit, Sit to Supine Supine to sit: Min guard Sit to supine: Min assist General bed mobility comments: Min A at the LE for sitting to supine. Min Verbal/tactile  cues for pt to get from supine to sitting Transfers Overall transfer level: Needs assistance Equipment used: Rolling walker (2 wheels) Transfers: Sit to/from Stand Sit to Stand: Min assist Bed to/from chair/wheelchair/BSC transfer type:: Stand pivot Stand pivot transfers: Mod assist, +2 physical assistance, +2 safety/equipment Squat pivot transfers: Mod assist, +2 physical assistance General transfer comment: Min A for hand placement with AD and verbal cues for body mechanics in order to decrease risk for falls. Ambulation/Gait Ambulation/Gait assistance: Max assist Gait Distance (Feet): 30 Feet Assistive device: Rolling walker (2 wheels) Gait Pattern/deviations: Step-to pattern, Decreased step length - left, Decreased stance time - left, Decreased weight shift to left General Gait Details: Pt requires Max A for verbal cues and assistance navigating RW with cuing "walker, big step L, big step R" for each  step for safe ambulation. Intermittenty pt would need cueing to take two steps with the L to surpass the R for a step to or partial step through to prevent the LLE from trailing. Gait velocity: Decreased cadence. Gait velocity interpretation: <1.31 ft/sec, indicative of household ambulator Stairs:  (Unable at this time due to current functional status.)    ADL: ADL Overall ADL's : Needs assistance/impaired Eating/Feeding: Maximal assistance, Sitting Eating/Feeding Details (indicate cue type and reason): secondary to blindness Grooming: Wash/dry hands, Wash/dry face, Sitting, Minimal assistance Upper Body Bathing: Moderate assistance, Sitting Lower Body Bathing:  Maximal assistance, Total assistance, Sitting/lateral leans, Sit to/from stand Upper Body Dressing : Moderate assistance, Sitting Lower Body Dressing: Maximal assistance, Total assistance, Sitting/lateral leans, Sit to/from stand, +2 for physical assistance Lower Body Dressing Details (indicate cue type and reason): changing brief out in standing, needing assist to thread and to doff and don, unable  to complete peri-care in standing Toilet Transfer: Moderate assistance, +2 for physical assistance, +2 for safety/equipment, Stand-pivot, Cueing for sequencing, Cueing for safety Toilet Transfer Details (indicate cue type and reason): simulated with sit<>stand transfers, cues to advance feet but noted increased ability to move L foot Toileting- Clothing Manipulation and Hygiene: Total assistance, +2 for physical assistance, +2 for safety/equipment, Sitting/lateral lean, Sit to/from stand Toileting - Clothing Manipulation Details (indicate cue type and reason): total A for peri care in standing Functional mobility during ADLs: Moderate assistance, +2 for physical assistance, +2 for safety/equipment, Cueing for safety, Cueing for sequencing General ADL Comments: Session focus on sit<>stands and functional mobility to increase overall activity tolerance  Cognition: Cognition Overall Cognitive Status: History of cognitive impairments - at baseline Arousal/Alertness: Awake/alert Orientation Level: Oriented to person Attention: Focused Focused Attention: Appears intact Memory: Impaired Awareness: Impaired Awareness Impairment: Emergent impairment, Intellectual impairment Problem Solving: Impaired Cognition Arousal/Alertness: Awake/alert Behavior During Therapy: WFL for tasks assessed/performed Overall Cognitive Status: History of cognitive impairments - at baseline General Comments: Wife reports he seems at his baseline currently. Pt has dementia and asks questions frequently and fidgets with things  often.  Physical Exam: Blood pressure 134/69, pulse 67, temperature 98.2 F (36.8 C), temperature source Oral, resp. rate 20, height 5' 2"$  (1.575 m), weight 80.7 kg, SpO2 100 %. Physical Exam Constitutional:      General: He is not in acute distress. HENT:     Head: Atraumatic.     Right Ear: External ear normal.     Left Ear: External ear normal.     Nose: Nose normal.     Mouth/Throat:     Pharynx: Oropharynx is clear.  Eyes:     Comments: Unable to see even light/dark  Cardiovascular:     Rate and Rhythm: Normal rate and regular rhythm.     Heart sounds: No murmur heard.    No gallop.  Pulmonary:     Effort: Pulmonary effort is normal. No respiratory distress.     Breath sounds: Normal breath sounds. No wheezing.  Abdominal:     General: Bowel sounds are normal. There is no distension.     Palpations: Abdomen is soft.  Musculoskeletal:        General: No swelling or tenderness.     Cervical back: Normal range of motion.     Comments: No pain with ROM or palpation  Neurological:     Mental Status: He is alert.     Comments: Patient is alert sitting up in bed.  Follows simple commands.  Oriented to person  only. Blind. Left facial droop. Speech slurred.  Moves right side preferentially over left. RUE 4/5. LUE 4-/5. RLE 4/5 LLE 3+ prox to 4/5 distally. Sensed pain in a 4's. Feet hypersensitive to touch. DTR's 1+. No abnl tone  Psychiatric:     Comments: Generally cooperative. Occasionally irritable today     Results for orders placed or performed during the hospital encounter of 04/09/22 (from the past 48 hour(s))  Glucose, capillary     Status: Abnormal   Collection Time: 04/12/22  6:00 AM  Result Value Ref Range   Glucose-Capillary 102 (H) 70 - 99 mg/dL    Comment: Glucose reference range applies only to samples taken after fasting for at least 8 hours.  Glucose, capillary     Status: Abnormal   Collection Time: 04/12/22 11:47 AM  Result Value Ref Range    Glucose-Capillary 207 (H) 70 - 99 mg/dL    Comment: Glucose reference range applies only to samples taken after fasting for at least 8 hours.  Glucose, capillary     Status: Abnormal   Collection Time: 04/12/22  4:19 PM  Result Value Ref Range   Glucose-Capillary 152 (H) 70 - 99 mg/dL    Comment: Glucose reference range applies only to samples taken after fasting for at least 8 hours.  Glucose, capillary     Status: Abnormal   Collection Time: 04/12/22  9:06 PM  Result Value Ref Range   Glucose-Capillary 149 (H) 70 - 99 mg/dL    Comment: Glucose reference range applies only to samples taken after fasting for at least 8 hours.  Comprehensive metabolic panel     Status: Abnormal   Collection Time: 04/13/22  3:19 AM  Result Value Ref Range   Sodium 136 135 - 145 mmol/L   Potassium 4.2 3.5 - 5.1 mmol/L   Chloride 103 98 - 111 mmol/L   CO2 23 22 - 32 mmol/L   Glucose, Bld 125 (H) 70 - 99 mg/dL    Comment: Glucose reference range applies only to samples taken after fasting for at least 8 hours.   BUN 17 8 - 23 mg/dL   Creatinine, Ser 1.69 (H) 0.61 - 1.24 mg/dL   Calcium 9.0 8.9 - 10.3 mg/dL   Total Protein 6.1 (L) 6.5 - 8.1 g/dL   Albumin 3.5 3.5 - 5.0 g/dL   AST 28 15 - 41 U/L   ALT 17 0 - 44 U/L   Alkaline Phosphatase 52 38 - 126 U/L   Total Bilirubin 0.5 0.3 - 1.2 mg/dL   GFR, Estimated 38 (L) >60 mL/min    Comment: (NOTE) Calculated using the CKD-EPI Creatinine Equation (2021)    Anion gap 10 5 - 15    Comment: Performed at [facility] [address], [place], [place] [address]  Magnesium     Status: None   Collection Time: 04/13/22  3:19 AM  Result Value Ref Range   Magnesium 1.9 1.7 - 2.4 mg/dL    Comment: Performed at [facility] [address], [place], [place] [address]  Glucose, capillary     Status: Abnormal   Collection Time: 04/13/22  6:14 AM  Result Value Ref Range   Glucose-Capillary 108 (H) 70 - 99 mg/dL    Comment: Glucose reference range  applies only to samples taken after fasting for at least 8 hours.  Glucose, capillary     Status: Abnormal   Collection Time: 04/13/22 11:54 AM  Result Value Ref Range  Glucose-Capillary 135 (H) 70 - 99 mg/dL    Comment: Glucose reference range applies only to samples taken after fasting for at least 8 hours.   Comment 1 Notify RN    Comment 2 Document in Chart   Glucose, capillary     Status: Abnormal   Collection Time: 04/13/22  5:03 PM  Result Value Ref Range   Glucose-Capillary 184 (H) 70 - 99 mg/dL    Comment: Glucose reference range applies only to samples taken after fasting for at least 8 hours.   Comment 1 Notify RN    Comment 2 Document in Chart   Glucose, capillary     Status: Abnormal   Collection Time: 04/13/22 10:02 PM  Result Value Ref Range   Glucose-Capillary 206 (H) 70 - 99 mg/dL    Comment: Glucose reference range applies only to samples taken after fasting for at least 8 hours.  CBC     Status: Abnormal   Collection Time: 04/14/22  4:42 AM  Result Value Ref Range   WBC 7.6 4.0 - 10.5 K/uL   RBC 4.97 4.22 - 5.81 MIL/uL   Hemoglobin 12.2 (L) 13.0 - 17.0 g/dL   HCT 39.0 39.0 - 52.0 %   MCV 78.5 (L) 80.0 - 100.0 fL   MCH 24.5 (L) 26.0 - 34.0 pg   MCHC 31.3 30.0 - 36.0 g/dL   RDW 14.3 11.5 - 15.5 %   Platelets 180 150 - 400 K/uL   nRBC 0.0 0.0 - 0.2 %    Comment: Performed at [facility], [place] [address], [place], [place] [address]   No results found.    Blood pressure 134/69, pulse 67, temperature 98.2 F (36.8 C), temperature source Oral, resp. rate 20, height 5' 2"$  (1.575 m), weight 80.7 kg, SpO2 100 %.  Medical Problem List and Plan: 1. Functional deficits secondary to right periventricular posterior frontal lobe infarct due to small vessel disease  -patient may shower  -ELOS/Goals: 7-10 days, min assist goals with PT, OT. Min to max with SLP. Pt needs to be more able to assist with care so that his wife can bring him home. 2.   Antithrombotics: -DVT/anticoagulation:  Mechanical: Antiembolism stockings, thigh (TED hose) Bilateral lower extremities  -antiplatelet therapy: Aspirin 81 mg daily and Plavix 75 mg daily x 3 weeks then Plavix alone 3. Pain Management: Tylenol as needed 4. Mood/Behavior/Sleep: Aricept 5 mg nightly, Atarax 10 mg nightly as needed sleep  -antipsychotic agents: N/A  -keep sleep chart 5. Neuropsych/cognition: This patient is not capable of making decisions on his own behalf. 6. Skin/Wound Care: Routine skin checks 7. Fluids/Electrolytes/Nutrition: Routine in and outs with follow-up chemistries.  -po intake has generally been steady 8.  Hyperlipidemia.  Crestor 9.  Diabetes mellitus.  Hemoglobin A1c 10.1.  Currently on SSI.  Patient on Amaryl 1 mg daily, Glucophage 500 mg twice daily prior to admission.  Resume as needed  -control inconsistent so far. Follow for pattern 10.  Permissive hypertension.  Currently on no antihypertensive medication.  Patient on Cozaar 25 mg daily, Toprol-XL 25 mg daily and Imdur 30 mg daily prior to admission.  Resume as needed 11.  CKD stage III.  Follow-up chemistries on Monday 12.  Total blindness.  Resume eyedrops.   Lavon Paganini Angiulli, PA-C 04/14/2022

## 2022-04-13 NOTE — Progress Notes (Signed)
  Progress Note   Patient: Grant Jensen. ZOX:[id number] DOB: 1928/05/23 DOA: 04/09/2022     4 DOS: the patient was seen and examined on 04/13/2022   Brief hospital course:  Assessment and Plan: Acute CVA  - ASA 81 mg PO daily  - Plavix 75 mg PO daily  - Crestor 20 mg PO daily  - ECHO 60-65%, no WMA's, grade I diastolic dysfunction  - Pt may have an inpt rehab bed on Fri 04/14/2022   HTN  - Monitor    DM uncontrolled (A1c 10.1) - Novolog SS tid w/ meals    Dementia - Aricept 5 mg PO daily  - Atarax 10 mg PO PRN    Glaucoma - Per documentation, pt declines to take eye drops    6. Obesity BMI ? 30 - 39.9 - Obesity  (32 kg/m2) - Complicating care    7. CKD Stage 3b - GFR 30 to 44 (Moderately to severely decreased)  - Complicating care    DVT prophylaxis: SCDs       Subjective: Pt seen and examined at the bedside. Pt may have an inpatient rehab bed available on Fri 04/14/2022. He remains on the medicine service.  Physical Exam: Vitals:   04/12/22 2348 04/13/22 0346 04/13/22 0845 04/13/22 1151  BP: 129/61 116/66 (!) 133/94 134/73  Pulse: 69 72 80 67  Resp: 18 18 16 17   Temp: 98.2 F (36.8 C) 98.6 F (37 C) 98.2 F (36.8 C) (!) 101 F (38.3 C)  TempSrc: Oral  Oral Oral  SpO2: 98% 100% 100% 100%  Weight:      Height:       Constitutional:      Appearance: Normal appearance.  HENT:     Head: Normocephalic.     Mouth/Throat:     Mouth: Mucous membranes are moist.  Cardiovascular:     Rate and Rhythm: Normal rate and regular rhythm.  Pulmonary:     Effort: Pulmonary effort is normal.  Abdominal:     General: Abdomen is flat.     Palpations: Abdomen is soft.  Skin:    General: Skin is warm.  Neurological:     Mental Status: He is alert. Mental status is at baseline.  Psychiatric:        Mood and Affect: Mood normal.   Data Reviewed:   Disposition: Status is: Inpatient  Planned Discharge Destination: Rehab    Time spent: 35  minutes  Author: Lucienne Minks , MD 04/13/2022 12:51 PM  For on call review [email or web address].

## 2022-04-13 NOTE — Progress Notes (Signed)
Mobility Specialist: Progress Note   04/13/22 1356  Mobility  Activity Ambulated with assistance in room  Level of Assistance Moderate assist, patient does 50-74%  Assistive Device Front wheel walker  Distance Ambulated (ft) 12 ft  Activity Response Tolerated fair  Mobility Referral Yes  $Mobility charge 1 Mobility   Pt received in the bed and agreeable to mobility. ModA with bed mobility as well as to stand. Verbal cues for BLE advancement as well as RW management. Stopped x1 for seated break secondary to general fatigue. No c/o pain or SOB. Pt back to bed after session with family present in the room. Bed alarm is on.   [place] Grant Jensen Mobility Specialist Please contact via SecureChat or Rehab office at [phone]

## 2022-04-13 NOTE — Progress Notes (Signed)
Physical Therapy Treatment Patient Details Name: Grant Jensen. MRN: [id number] DOB: Feb 11, 1929 Today's Date: 04/13/2022   History of Present Illness Pt is a 89 y.o. male who presented 04/09/22 with left sided weakness and dysarthria. Outside window for TNK. CT head shows no acute abnormality. Awaiting brain MRI. PMH: dementia, DM, blindness due to glaucoma    PT Comments    Pt is progressing towards goals. Pt was able to increase gait distance this session but continues to require Max A with cueing for safe gait to prevent trailing the LLE resulting in fall. Pt has improved bed mobility and transfers this session but continues to require Min a for verbal cues. Due to pt current functional status, home set up, PLOF and available assistance at home recommending higher level of care with skilled physical therapy services on discharge from acute care hospital setting in order to decrease risk for falls, immobility, injury and care giver burden to prevent re-hospitalization.     Recommendations for follow up therapy are one component of a multi-disciplinary discharge planning process, led by the attending physician.  Recommendations may be updated based on patient status, additional functional criteria and insurance authorization.  Follow Up Recommendations  Acute inpatient rehab (3hours/day)     Assistance Recommended at Discharge Frequent or constant Supervision/Assistance  Patient can return home with the following Two people to help with walking and/or transfers;Assistance with cooking/housework;Assist for transportation;Help with stairs or ramp for entrance   Equipment Recommendations  Hospital bed;Wheelchair (measurements PT);Wheelchair cushion (measurements PT)    Recommendations for Other Services       Precautions / Restrictions Precautions Precautions: Fall;Other (comment) Precaution Comments: blind Restrictions Weight Bearing Restrictions: No     Mobility  Bed  Mobility Overal bed mobility: Needs Assistance Bed Mobility: Supine to Sit, Sit to Supine     Supine to sit: Min guard Sit to supine: Min assist   General bed mobility comments: Min A at the LE for sitting to supine. Min Verbal/tactile  cues for pt to get from supine to sitting Patient Response: Cooperative  Transfers Overall transfer level: Needs assistance Equipment used: Rolling walker (2 wheels) Transfers: Sit to/from Stand Sit to Stand: Min assist           General transfer comment: Min A for hand placement with AD and verbal cues for body mechanics in order to decrease risk for falls.    Ambulation/Gait Ambulation/Gait assistance: Max assist Gait Distance (Feet): 30 Feet Assistive device: Rolling walker (2 wheels) Gait Pattern/deviations: Step-to pattern, Decreased step length - left, Decreased stance time - left, Decreased weight shift to left Gait velocity: Decreased cadence. Gait velocity interpretation: <1.31 ft/sec, indicative of household ambulator   General Gait Details: Pt requires Max A for verbal cues and assistance navigating RW with cuing "walker, big step L, big step R" for each step for safe ambulation. Intermittenty pt would need cueing to take two steps with the L to surpass the R for a step to or partial step through to prevent the LLE from trailing.   Stairs Stairs:  (Unable at this time due to current functional status.)           Wheelchair Mobility    Modified Rankin (Stroke Patients Only) Modified Rankin (Stroke Patients Only) Pre-Morbid Rankin Score: Moderately severe disability Modified Rankin: Moderately severe disability     Balance Overall balance assessment: Needs assistance Sitting-balance support: Single extremity supported, Feet supported Sitting balance-Leahy Scale: Fair Sitting balance - Comments: No LOB  while sitting.   Standing balance support: Bilateral upper extremity supported, Single extremity supported Standing  balance-Leahy Scale: Fair Standing balance comment: Pt can stand without AD or UE support but with dynamic activities requires AD and constant cueing in order to maintain upright balance and prevent fall mostly due to poor kinesthetic awareness at the LE.          Cognition Arousal/Alertness: Awake/alert Behavior During Therapy: WFL for tasks assessed/performed Overall Cognitive Status: History of cognitive impairments - at baseline     General Comments: Wife reports he seems at his baseline currently. Pt has dementia and asks questions frequently and fidgets with things often.           General Comments General comments (skin integrity, edema, etc.): Wife was present near end of session. Very supportive. Pt was fatigued by end of session.      Pertinent Vitals/Pain Pain Assessment Pain Assessment: No/denies pain     PT Goals (current goals can now be found in the care plan section) Acute Rehab PT Goals Patient Stated Goal: did not state; wife hopeful for pt to improve PT Goal Formulation: With patient/family Time For Goal Achievement: 04/23/22 Potential to Achieve Goals: Fair Progress towards PT goals: Progressing toward goals    Frequency    Min 3X/week      PT Plan Current plan remains appropriate       AM-PAC PT "6 Clicks" Mobility   Outcome Measure  Help needed turning from your back to your side while in a flat bed without using bedrails?: A Little Help needed moving from lying on your back to sitting on the side of a flat bed without using bedrails?: A Little Help needed moving to and from a bed to a chair (including a wheelchair)?: A Little Help needed standing up from a chair using your arms (e.g., wheelchair or bedside chair)?: A Little Help needed to walk in hospital room?: A Lot Help needed climbing 3-5 steps with a railing? : Total 6 Click Score: 15    End of Session Equipment Utilized During Treatment: Gait belt Activity Tolerance: Patient  tolerated treatment well Patient left: with call bell/phone within reach;with family/visitor present;in bed;with bed alarm set Nurse Communication: Mobility status PT Visit Diagnosis: Unsteadiness on feet (R26.81);Other abnormalities of gait and mobility (R26.89);Repeated falls (R29.6);Muscle weakness (generalized) (M62.81);History of falling (Z91.81);Difficulty in walking, not elsewhere classified (R26.2);Other symptoms and signs involving the nervous system (R29.898)     Time: 2706-2376 PT Time Calculation (min) (ACUTE ONLY): 23 min  Charges:  $Gait Training: 8-22 mins $Therapeutic Activity: 8-22 mins                     Tomma Rakers, DPT, [place] Office: [phone] (Secure chat preferred)    Ander Purpura 04/13/2022, 2:33 PM

## 2022-04-14 ENCOUNTER — Inpatient Hospital Stay (HOSPITAL_COMMUNITY)
Admission: RE | Admit: 2022-04-14 | Discharge: 2022-04-19 | DRG: 057 | Disposition: A | Discharge status: Home Health | Payer: Medicare Other | Source: Intra-hospital | Attending: Physical Medicine and Rehabilitation | Admitting: Physical Medicine and Rehabilitation

## 2022-04-14 DIAGNOSIS — K219 Gastro-esophageal reflux disease without esophagitis: Secondary | ICD-10-CM | POA: Diagnosis present

## 2022-04-14 DIAGNOSIS — Z888 Allergy status to other drugs, medicaments and biological substances status: Secondary | ICD-10-CM

## 2022-04-14 DIAGNOSIS — I69328 Other speech and language deficits following cerebral infarction: Secondary | ICD-10-CM | POA: Diagnosis not present

## 2022-04-14 DIAGNOSIS — Z7902 Long term (current) use of antithrombotics/antiplatelets: Secondary | ICD-10-CM

## 2022-04-14 DIAGNOSIS — Z9181 History of falling: Secondary | ICD-10-CM

## 2022-04-14 DIAGNOSIS — Z7984 Long term (current) use of oral hypoglycemic drugs: Secondary | ICD-10-CM

## 2022-04-14 DIAGNOSIS — H42 Glaucoma in diseases classified elsewhere: Secondary | ICD-10-CM | POA: Diagnosis present

## 2022-04-14 DIAGNOSIS — E669 Obesity, unspecified: Secondary | ICD-10-CM

## 2022-04-14 DIAGNOSIS — Z79899 Other long term (current) drug therapy: Secondary | ICD-10-CM | POA: Diagnosis not present

## 2022-04-14 DIAGNOSIS — I129 Hypertensive chronic kidney disease with stage 1 through stage 4 chronic kidney disease, or unspecified chronic kidney disease: Secondary | ICD-10-CM | POA: Diagnosis present

## 2022-04-14 DIAGNOSIS — E11319 Type 2 diabetes mellitus with unspecified diabetic retinopathy without macular edema: Secondary | ICD-10-CM | POA: Diagnosis present

## 2022-04-14 DIAGNOSIS — R269 Unspecified abnormalities of gait and mobility: Secondary | ICD-10-CM | POA: Diagnosis present

## 2022-04-14 DIAGNOSIS — I10 Essential (primary) hypertension: Secondary | ICD-10-CM

## 2022-04-14 DIAGNOSIS — J470 Bronchiectasis with acute lower respiratory infection: Secondary | ICD-10-CM | POA: Diagnosis not present

## 2022-04-14 DIAGNOSIS — I428 Other cardiomyopathies: Secondary | ICD-10-CM | POA: Diagnosis present

## 2022-04-14 DIAGNOSIS — Z736 Limitation of activities due to disability: Secondary | ICD-10-CM

## 2022-04-14 DIAGNOSIS — E1169 Type 2 diabetes mellitus with other specified complication: Secondary | ICD-10-CM

## 2022-04-14 DIAGNOSIS — Z880 Allergy status to penicillin: Secondary | ICD-10-CM | POA: Diagnosis not present

## 2022-04-14 DIAGNOSIS — F01B0 Vascular dementia, moderate, without behavioral disturbance, psychotic disturbance, mood disturbance, and anxiety: Secondary | ICD-10-CM

## 2022-04-14 DIAGNOSIS — I69354 Hemiplegia and hemiparesis following cerebral infarction affecting left non-dominant side: Secondary | ICD-10-CM | POA: Diagnosis not present

## 2022-04-14 DIAGNOSIS — N183 Chronic kidney disease, stage 3 unspecified: Secondary | ICD-10-CM | POA: Diagnosis not present

## 2022-04-14 DIAGNOSIS — E785 Hyperlipidemia, unspecified: Secondary | ICD-10-CM | POA: Diagnosis present

## 2022-04-14 DIAGNOSIS — E1122 Type 2 diabetes mellitus with diabetic chronic kidney disease: Secondary | ICD-10-CM | POA: Diagnosis present

## 2022-04-14 DIAGNOSIS — H548 Legal blindness, as defined in USA: Secondary | ICD-10-CM | POA: Diagnosis present

## 2022-04-14 DIAGNOSIS — F0390 Unspecified dementia without behavioral disturbance: Secondary | ICD-10-CM | POA: Diagnosis present

## 2022-04-14 DIAGNOSIS — R32 Unspecified urinary incontinence: Secondary | ICD-10-CM | POA: Diagnosis present

## 2022-04-14 DIAGNOSIS — I639 Cerebral infarction, unspecified: Principal | ICD-10-CM | POA: Diagnosis present

## 2022-04-14 DIAGNOSIS — Z66 Do not resuscitate: Secondary | ICD-10-CM | POA: Diagnosis present

## 2022-04-14 DIAGNOSIS — Z9989 Dependence on other enabling machines and devices: Secondary | ICD-10-CM

## 2022-04-14 DIAGNOSIS — E1139 Type 2 diabetes mellitus with other diabetic ophthalmic complication: Secondary | ICD-10-CM | POA: Diagnosis present

## 2022-04-14 DIAGNOSIS — Z7982 Long term (current) use of aspirin: Secondary | ICD-10-CM | POA: Diagnosis not present

## 2022-04-14 DIAGNOSIS — I69392 Facial weakness following cerebral infarction: Secondary | ICD-10-CM

## 2022-04-14 DIAGNOSIS — R051 Acute cough: Secondary | ICD-10-CM | POA: Diagnosis not present

## 2022-04-14 LAB — GLUCOSE, CAPILLARY
Glucose-Capillary: 106 mg/dL — ABNORMAL HIGH (ref 70–99)
Glucose-Capillary: 161 mg/dL — ABNORMAL HIGH (ref 70–99)
Glucose-Capillary: 135 mg/dL — ABNORMAL HIGH (ref 70–99)
Glucose-Capillary: 152 mg/dL — ABNORMAL HIGH (ref 70–99)

## 2022-04-14 LAB — CBC
HCT: 39 % (ref 39.0–52.0)
Hemoglobin: 12.2 g/dL — ABNORMAL LOW (ref 13.0–17.0)
MCH: 24.5 pg — ABNORMAL LOW (ref 26.0–34.0)
MCHC: 31.3 g/dL (ref 30.0–36.0)
MCV: 78.5 fL — ABNORMAL LOW (ref 80.0–100.0)
Platelets: 180 10*3/uL (ref 150–400)
RBC: 4.97 MIL/uL (ref 4.22–5.81)
RDW: 14.3 % (ref 11.5–15.5)
WBC: 7.6 10*3/uL (ref 4.0–10.5)
nRBC: 0 % (ref 0.0–0.2)

## 2022-04-14 LAB — COMPREHENSIVE METABOLIC PANEL
ALT: 18 U/L (ref 0–44)
AST: 29 U/L (ref 15–41)
Albumin: 3.7 g/dL (ref 3.5–5.0)
Alkaline Phosphatase: 53 U/L (ref 38–126)
Anion gap: 14 (ref 5–15)
BUN: 16 mg/dL (ref 8–23)
CO2: 22 mmol/L (ref 22–32)
Calcium: 9.2 mg/dL (ref 8.9–10.3)
Chloride: 102 mmol/L (ref 98–111)
Creatinine, Ser: 1.48 mg/dL — ABNORMAL HIGH (ref 0.61–1.24)
GFR, Estimated: 44 mL/min — ABNORMAL LOW (ref >60)
Glucose, Bld: 110 mg/dL — ABNORMAL HIGH (ref 70–99)
Potassium: 4.4 mmol/L (ref 3.5–5.1)
Sodium: 138 mmol/L (ref 135–145)
Total Bilirubin: 0.3 mg/dL (ref 0.3–1.2)
Total Protein: 6.2 g/dL — ABNORMAL LOW (ref 6.5–8.1)

## 2022-04-14 LAB — MAGNESIUM: Magnesium: 2.1 mg/dL (ref 1.7–2.4)

## 2022-04-14 MED ORDER — ACETAMINOPHEN 160 MG/5ML PO SOLN: 650.0000 mg | ORAL | Status: DC | PRN

## 2022-04-14 MED ORDER — ACETAMINOPHEN 325 MG PO TABS: 650.0000 mg | ORAL_TABLET | ORAL | Status: DC | PRN

## 2022-04-14 MED ORDER — PANTOPRAZOLE SODIUM 40 MG PO TBEC: 40.0000 mg | DELAYED_RELEASE_TABLET | Freq: Every day | ORAL | 1 refills | Status: DC

## 2022-04-14 MED ORDER — GLIMEPIRIDE 1 MG PO TABS
1.0000 mg | ORAL_TABLET | Freq: Every day | ORAL | Status: DC
  Administered 2022-04-15 – 2022-04-19 (×5): 1 mg via ORAL
  Filled 2022-04-14 (×5): qty 1

## 2022-04-14 MED ORDER — ALBUTEROL SULFATE HFA 108 (90 BASE) MCG/ACT IN AERS: 2.0000 | INHALATION_SPRAY | Freq: Four times a day (QID) | RESPIRATORY_TRACT | 2 refills | Status: DC | PRN

## 2022-04-14 MED ORDER — METFORMIN HCL 500 MG PO TABS
500.0000 mg | ORAL_TABLET | Freq: Every day | ORAL | Status: DC
  Administered 2022-04-15 – 2022-04-19 (×5): 500 mg via ORAL
  Filled 2022-04-14 (×5): qty 1

## 2022-04-14 MED ORDER — SENNOSIDES-DOCUSATE SODIUM 8.6-50 MG PO TABS: 1.0000 | ORAL_TABLET | Freq: Every evening | ORAL | Status: DC | PRN

## 2022-04-14 MED ORDER — IPRATROPIUM-ALBUTEROL 0.5-2.5 (3) MG/3ML IN SOLN: 3.0000 mL | Freq: Four times a day (QID) | RESPIRATORY_TRACT | Status: DC | PRN

## 2022-04-14 MED ORDER — CLOPIDOGREL BISULFATE 75 MG PO TABS
75.0000 mg | ORAL_TABLET | Freq: Every day | ORAL | Status: DC
  Administered 2022-04-15 – 2022-04-19 (×5): 75 mg via ORAL
  Filled 2022-04-14 (×5): qty 1

## 2022-04-14 MED ORDER — HYDROXYZINE HCL 10 MG PO TABS
10.0000 mg | ORAL_TABLET | Freq: Every evening | ORAL | Status: DC | PRN
  Administered 2022-04-17: 10 mg via ORAL
  Filled 2022-04-14 (×2): qty 1

## 2022-04-14 MED ORDER — ROSUVASTATIN CALCIUM 20 MG PO TABS: 20.0000 mg | ORAL_TABLET | Freq: Every day | ORAL | 0 refills | Status: DC

## 2022-04-14 MED ORDER — ASPIRIN 81 MG PO TBEC: 81.0000 mg | DELAYED_RELEASE_TABLET | Freq: Every day | ORAL | 0 refills | Status: AC

## 2022-04-14 MED ORDER — CLOPIDOGREL BISULFATE 75 MG PO TABS: 75.0000 mg | ORAL_TABLET | Freq: Every day | ORAL | 11 refills | Status: DC

## 2022-04-14 MED ORDER — INSULIN ASPART 100 UNIT/ML IJ SOLN
0.0000 [IU] | Freq: Three times a day (TID) | INTRAMUSCULAR | Status: DC
  Administered 2022-04-15 – 2022-04-16 (×3): 1 [IU] via SUBCUTANEOUS

## 2022-04-14 MED ORDER — ROSUVASTATIN CALCIUM 20 MG PO TABS
20.0000 mg | ORAL_TABLET | Freq: Every day | ORAL | Status: DC
  Administered 2022-04-15 – 2022-04-19 (×5): 20 mg via ORAL
  Filled 2022-04-14 (×5): qty 1

## 2022-04-14 MED ORDER — LATANOPROST 0.005 % OP SOLN
1.0000 [drp] | Freq: Every day | OPHTHALMIC | Status: DC
  Administered 2022-04-14 – 2022-04-18 (×5): 1 [drp] via OPHTHALMIC
  Filled 2022-04-14: qty 2.5

## 2022-04-14 MED ORDER — ACETAMINOPHEN 650 MG RE SUPP: 650.0000 mg | RECTAL | Status: DC | PRN

## 2022-04-14 MED ORDER — DONEPEZIL HCL 10 MG PO TABS
5.0000 mg | ORAL_TABLET | Freq: Every day | ORAL | Status: DC
  Administered 2022-04-14 – 2022-04-18 (×5): 5 mg via ORAL
  Filled 2022-04-14 (×5): qty 1

## 2022-04-14 MED ORDER — ASPIRIN 81 MG PO TBEC
81.0000 mg | DELAYED_RELEASE_TABLET | Freq: Every day | ORAL | Status: DC
  Administered 2022-04-15 – 2022-04-19 (×5): 81 mg via ORAL
  Filled 2022-04-14 (×5): qty 1

## 2022-04-14 NOTE — Progress Notes (Signed)
Grant Staggers, MD  Physician Physical Medicine and Rehabilitation   Consult Note     Signed   Date of Service: 04/11/2022  1:18 PM  Related encounter: ED to Hosp-Admission (Current) from 04/09/2022 in [facility]   Signed     Expand All Collapse All  Show:Clear all [x]$ Written[x]$ Templated[]$ Copied  Added by: [x]$ Grant Staggers, MD  []$ Hover for details          Physical Medicine and Rehabilitation Consult Reason for Consult: New mobility deficits after stroke Referring Physician: Feliberto Gottron     HPI: Grant Jensen. is a 89 y.o. male with a history of dementia and blindness who was admitted on 04/09/2022 with increased left-sided weakness and dysarthria.  He was unable to stand for hygiene and personal care tasks.  CT of the head was negative.  EEG was performed and showed mild diffuse slowing.  MRI ultimately demonstrated small foci of acute infarct in the right periventricular posterior frontal lobe/corona radiata.  Neurology feels that stroke is due to small vessel disease.  Patient has been working with physical therapy while on acute and has been making progress.  Today he was requiring 2 person moderate assistance for sit to stand transfers and ambulation in the room with rolling walker.  He was requiring manual assistance/tactile facilitation for weight shift and advancing the left leg forward.   Prior to arrival, patient was able to assist in his self-care and mobility from a physical aspect.  He was able to transfer on his own and could ambulate short distances with supervision to contact-guard assistance per wife.  Patient was attending adult daycare 3 days a week as well.  Patient was occasionally incontinent at home at baseline.  He wore undergarments for protection.  He has had issues with falls at home as well.     Review of Systems  Constitutional:  Negative for fever.  HENT:  Positive for hearing loss.   Eyes:  Eye pain: blind.  Respiratory:   Negative for cough.   Cardiovascular:  Negative for chest pain.  Gastrointestinal:  Negative for nausea.  Genitourinary:  Negative for dysuria.  Musculoskeletal:  Positive for falls.  Neurological:  Positive for focal weakness and weakness.  Psychiatric/Behavioral:  Positive for memory loss.         Past Medical History:  Diagnosis Date   Blindness     Dementia ([place])     Diabetes mellitus without complication ([place])     Glaucoma     Hypertension           Past Surgical History:  Procedure Laterality Date   LEFT HEART CATHETERIZATION WITH CORONARY ANGIOGRAM N/A 06/22/2014    Procedure: LEFT HEART CATHETERIZATION WITH CORONARY ANGIOGRAM;  Surgeon: Adrian Prows, MD;  Location: [facility] CATH LAB;  Service: Cardiovascular;  Laterality: N/A;    History reviewed. No pertinent family history. Social History:  reports that he has never smoked. He does not have any smokeless tobacco history on file. He reports current alcohol use. He reports that he does not currently use drugs. Allergies:      Allergies  Allergen Reactions   Lisinopril Cough   Penicillin G Hives and Rash          Medications Prior to Admission  Medication Sig Dispense Refill   acetaminophen (TYLENOL) 325 MG tablet Take 2 tablets (650 mg total) by mouth every 6 (six) hours as needed for moderate pain.       albuterol (VENTOLIN  HFA) 108 (90 Base) MCG/ACT inhaler Inhale 2 puffs into the lungs every 4 (four) hours as needed for wheezing or shortness of breath.       aspirin EC 81 MG tablet Take 81 mg by mouth daily.       donepezil (ARICEPT) 5 MG tablet Take 5 mg by mouth at bedtime.       glimepiride (AMARYL) 1 MG tablet Take 1 mg by mouth every morning.       hydrOXYzine (ATARAX) 25 MG tablet Take 1 at night as needed for sleep (Patient taking differently: Take 10 mg by mouth at bedtime as needed (sleep). Take 1 at night as needed for sleep) 30 tablet 0   ipratropium-albuterol (DUONEB) 0.5-2.5 (3) MG/3ML SOLN Take 3 mLs by  nebulization every 6 (six) hours as needed (SOB).       latanoprost (XALATAN) 0.005 % ophthalmic solution Place 1 drop into both eyes at bedtime.       losartan (COZAAR) 25 MG tablet Take 25 mg by mouth daily.       metFORMIN (GLUCOPHAGE) 500 MG tablet Take 1 tablet (500 mg total) by mouth daily with breakfast. (Patient taking differently: Take 500 mg by mouth 2 (two) times daily.)       metoprolol succinate (TOPROL-XL) 25 MG 24 hr tablet Take 25 mg by mouth daily.       omeprazole (PRILOSEC) 20 MG capsule Take 20 mg by mouth every other day.       isosorbide mononitrate (IMDUR) 30 MG 24 hr tablet Take 30 mg by mouth daily. (Patient not taking: Reported on 04/09/2022)       predniSONE (DELTASONE) 50 MG tablet Take 1 tablet (50 mg total) by mouth daily. (Patient not taking: Reported on 04/09/2022) 5 tablet 0      Home: Home Living Family/patient expects to be discharged to:: Private residence Living Arrangements: Spouse/significant other Available Help at Discharge: Family, Available 24 hours/day Type of Home: House Home Access: Stairs to enter [employer] of Steps: 3 Entrance Stairs-Rails: Right, Left Home Layout: One level Bathroom Shower/Tub: Chiropodist: Standard Bathroom Accessibility: Yes Home Equipment: Conservation officer, nature (2 wheels), Transport chair, BSC/3in1, Civil engineer, contracting, Grab bars - tub/shower, Hand held shower head  Functional History: Prior Function Prior Level of Function : History of Falls (last six months), Needs assist Mobility Comments: Pt requires bil HHA or RW for mobility and guidance for all standing mobility due to his blindness. Able to ambulate household distances and navigate stairs with guidance. Hx of falls. ADLs Comments: Wife assists with all ADLs due to blindness and dementia. Pt able to assist in dressing. Functional Status:  Mobility: Bed Mobility Overal bed mobility: Needs Assistance Bed Mobility: Supine to Sit Supine to sit: Mod  assist, +2 for physical assistance Sit to supine: Max assist, HOB elevated General bed mobility comments: OOB in chair Transfers Overall transfer level: Needs assistance Equipment used: Rolling walker (2 wheels) Transfers: Sit to/from Stand Sit to Stand: Mod assist, +2 physical assistance Bed to/from chair/wheelchair/BSC transfer type:: Stand pivot Stand pivot transfers: Mod assist, +2 physical assistance, +2 safety/equipment Squat pivot transfers: Mod assist, +2 physical assistance General transfer comment: ModA + 2 to rise from recliner, hand over hand guidance for placing on walker. PT stabilized while pt pulled up Ambulation/Gait Ambulation/Gait assistance: Mod assist, +2 physical assistance, +2 safety/equipment Gait Distance (Feet): 7 Feet Assistive device: Rolling walker (2 wheels) Gait Pattern/deviations: Step-to pattern, Decreased step length - left, Decreased stance time -  left, Decreased weight shift to left General Gait Details: Pt with shortened, step to gait, tactile/manual assist for weight shifting and progressing LLE forwards. Verbal cueing for larger R steps and stepping initiation. Close chair follow Gait velocity: decreased Gait velocity interpretation: <1.31 ft/sec, indicative of household ambulator   ADL: ADL Overall ADL's : Needs assistance/impaired Eating/Feeding: Maximal assistance, Sitting Eating/Feeding Details (indicate cue type and reason): secondary to blindness Grooming: Moderate assistance, Wash/dry hands, Wash/dry face, Sitting Upper Body Bathing: Moderate assistance, Sitting Lower Body Bathing: Maximal assistance, Total assistance, Sitting/lateral leans, Sit to/from stand Upper Body Dressing : Moderate assistance, Sitting Lower Body Dressing: Maximal assistance, Total assistance, Sitting/lateral leans, Sit to/from stand, +2 for physical assistance Lower Body Dressing Details (indicate cue type and reason): changing brief out in standing, needing assist  to thread and to doff and don, unable  to complete peri-care in standing Toilet Transfer: Moderate assistance, +2 for physical assistance, +2 for safety/equipment, Stand-pivot, Cueing for sequencing, Cueing for safety Toilet Transfer Details (indicate cue type and reason): simulated with transfer from recliner to bed, cues to pivot feet but noted increased ability to move L foot Toileting- Clothing Manipulation and Hygiene: Total assistance, +2 for physical assistance, +2 for safety/equipment, Sitting/lateral lean, Sit to/from stand Toileting - Clothing Manipulation Details (indicate cue type and reason): total A for peri care in standing Functional mobility during ADLs: Moderate assistance, +2 for physical assistance, +2 for safety/equipment, Cueing for safety, Cueing for sequencing General ADL Comments: Patient presenting with L sided weakness (more prominent in leg than arm) and need for increased asssit for all aspects of care   Cognition: Cognition Overall Cognitive Status: History of cognitive impairments - at baseline Arousal/Alertness: Awake/alert Orientation Level: Oriented to person Attention: Focused Focused Attention: Appears intact Memory: Impaired Awareness: Impaired Awareness Impairment: Emergent impairment, Intellectual impairment Problem Solving: Impaired Cognition Arousal/Alertness: Awake/alert Behavior During Therapy: WFL for tasks assessed/performed Overall Cognitive Status: History of cognitive impairments - at baseline General Comments: Wife reports he seems at his baseline currently. Pt has dementia and asks questions frequently and fidgets with things often.   Blood pressure 135/69, pulse 75, temperature 97.8 F (36.6 C), temperature source Oral, resp. rate 15, height 5' 2"$  (1.575 m), weight 80.7 kg, SpO2 92 %. Physical Exam Constitutional:      General: He is not in acute distress.    Appearance: He is ill-appearing.     Comments: Wearing bilateral mittens   HENT:     Head: Normocephalic.     Right Ear: External ear normal.     Left Ear: External ear normal.     Nose: Nose normal.     Mouth/Throat:     Mouth: Mucous membranes are moist.  Eyes:     Comments: Blind. Cannot even see light or dark.  Cardiovascular:     Rate and Rhythm: Normal rate.  Pulmonary:     Effort: Pulmonary effort is normal.  Abdominal:     General: There is no distension.     Palpations: Abdomen is soft.  Musculoskeletal:        General: No tenderness.  Skin:    General: Skin is warm and dry.     Findings: Bruising present.  Neurological:     Comments: Oriented to person only. Followed basic commands. Is distracted. Speech dysarthric with mild left facial weakness. Moves all 4 limbs. RUE 4+/5. LUE 4 to 4+/5. RLE 3+ prox to 5/5 distally. LLE 3 prox to 4+ distally. Senses pain in all 4's.  No resting abnl tone.   Psychiatric:     Comments: Pleasantly confused.         Lab Results Last 24 Hours       Results for orders placed or performed during the hospital encounter of 04/09/22 (from the past 24 hour(s))  Glucose, capillary     Status: Abnormal    Collection Time: 04/10/22  5:17 PM  Result Value Ref Range    Glucose-Capillary 126 (H) 70 - 99 mg/dL  Glucose, capillary     Status: Abnormal    Collection Time: 04/10/22  9:21 PM  Result Value Ref Range    Glucose-Capillary 183 (H) 70 - 99 mg/dL    Comment 1 Notify RN      Comment 2 Document in Chart    CBC     Status: Abnormal    Collection Time: 04/11/22  6:09 AM  Result Value Ref Range    WBC 7.2 4.0 - 10.5 K/uL    RBC 4.51 4.22 - 5.81 MIL/uL    Hemoglobin 11.4 (L) 13.0 - 17.0 g/dL    HCT 35.2 (L) 39.0 - 52.0 %    MCV 78.0 (L) 80.0 - 100.0 fL    MCH 25.3 (L) 26.0 - 34.0 pg    MCHC 32.4 30.0 - 36.0 g/dL    RDW 14.2 11.5 - 15.5 %    Platelets 178 150 - 400 K/uL    nRBC 0.0 0.0 - 0.2 %  Comprehensive metabolic panel     Status: Abnormal    Collection Time: 04/11/22  6:09 AM  Result Value Ref Range     Sodium 136 135 - 145 mmol/L    Potassium 4.0 3.5 - 5.1 mmol/L    Chloride 105 98 - 111 mmol/L    CO2 20 (L) 22 - 32 mmol/L    Glucose, Bld 131 (H) 70 - 99 mg/dL    BUN 14 8 - 23 mg/dL    Creatinine, Ser 1.50 (H) 0.61 - 1.24 mg/dL    Calcium 8.8 (L) 8.9 - 10.3 mg/dL    Total Protein 6.7 6.5 - 8.1 g/dL    Albumin 3.7 3.5 - 5.0 g/dL    AST 30 15 - 41 U/L    ALT 17 0 - 44 U/L    Alkaline Phosphatase 62 38 - 126 U/L    Total Bilirubin 0.4 0.3 - 1.2 mg/dL    GFR, Estimated 44 (L) >60 mL/min    Anion gap 11 5 - 15  Magnesium     Status: None    Collection Time: 04/11/22  6:09 AM  Result Value Ref Range    Magnesium 1.8 1.7 - 2.4 mg/dL  Glucose, capillary     Status: Abnormal    Collection Time: 04/11/22  6:12 AM  Result Value Ref Range    Glucose-Capillary 108 (H) 70 - 99 mg/dL    Comment 1 Notify RN      Comment 2 Document in Chart    Glucose, capillary     Status: Abnormal    Collection Time: 04/11/22 11:25 AM  Result Value Ref Range    Glucose-Capillary 194 (H) 70 - 99 mg/dL       Imaging Results (Last 48 hours)  ECHOCARDIOGRAM COMPLETE   Result Date: 04/10/2022    ECHOCARDIOGRAM REPORT   Patient Name:   Grant Jensen. Date of Exam: 04/10/2022 Medical Rec #:  [id number]          Height:  67.0 in Accession #:    [id number]         Weight:       156.5 lb Date of Birth:  Nov 26, 1928          BSA:          1.822 m Patient Age:    43 years           BP:           107/51 mmHg Patient Gender: M                  HR:           72 bpm. Exam Location:  Inpatient Procedure: 2D Echo, Cardiac Doppler and Color Doppler Indications:    Stroke I63.9  History:        Patient has no prior history of Echocardiogram examinations.                 Cardiomyopathy, Signs/Symptoms:Chest Pain; Risk                 Factors:Hypertension and Diabetes.  Sonographer:    Ronny Flurry Referring Phys: [id number] [place]  1. Left ventricular ejection fraction, by estimation, is 60 to 65%.  The left ventricle has normal function. The left ventricle has no regional wall motion abnormalities. There is mild concentric left ventricular hypertrophy. Left ventricular diastolic parameters are consistent with Grade I diastolic dysfunction (impaired relaxation).  2. Right ventricular systolic function is normal. The right ventricular size is normal.  3. The mitral valve is degenerative. Mild mitral valve regurgitation. No evidence of mitral stenosis. Moderate mitral annular calcification.  4. The aortic valve is tricuspid. There is moderate calcification of the aortic valve. Aortic valve regurgitation is not visualized. No aortic stenosis is present.  5. The inferior vena cava is normal in size with greater than 50% respiratory variability, suggesting right atrial pressure of 3 mmHg. FINDINGS  Left Ventricle: Left ventricular ejection fraction, by estimation, is 60 to 65%. The left ventricle has normal function. The left ventricle has no regional wall motion abnormalities. The left ventricular internal cavity size was normal in size. There is  mild concentric left ventricular hypertrophy. Left ventricular diastolic parameters are consistent with Grade I diastolic dysfunction (impaired relaxation). Right Ventricle: The right ventricular size is normal. No increase in right ventricular wall thickness. Right ventricular systolic function is normal. Left Atrium: Left atrial size was normal in size. Right Atrium: Right atrial size was normal in size. Pericardium: There is no evidence of pericardial effusion. Mitral Valve: The mitral valve is degenerative in appearance. There is mild calcification of the mitral valve leaflet(s). Moderate mitral annular calcification. Mild mitral valve regurgitation. No evidence of mitral valve stenosis. Tricuspid Valve: The tricuspid valve is normal in structure. Tricuspid valve regurgitation is trivial. No evidence of tricuspid stenosis. Aortic Valve: The aortic valve is tricuspid.  There is moderate calcification of the aortic valve. Aortic valve regurgitation is not visualized. No aortic stenosis is present. Aortic valve mean gradient measures 8.5 mmHg. Aortic valve peak gradient measures 15.1 mmHg. Aortic valve area, by VTI measures 2.12 cm. Pulmonic Valve: The pulmonic valve was normal in structure. Pulmonic valve regurgitation is trivial. No evidence of pulmonic stenosis. Aorta: The aortic root is normal in size and structure. Venous: The inferior vena cava was not well visualized. The inferior vena cava is normal in size with greater than 50% respiratory variability, suggesting right atrial pressure of 3 mmHg. IAS/Shunts:  No atrial level shunt detected by color flow Doppler.  LEFT VENTRICLE PLAX 2D LVIDd:         3.20 cm   Diastology LVIDs:         2.50 cm   LV e' medial:    4.90 cm/s LV PW:         1.50 cm   LV E/e' medial:  18.6 LV IVS:        0.90 cm   LV e' lateral:   6.53 cm/s LVOT diam:     2.00 cm   LV E/e' lateral: 14.0 LV SV:         85 LV SV Index:   46 LVOT Area:     3.14 cm  RIGHT VENTRICLE RV S prime:     5.66 cm/s TAPSE (M-mode): 1.2 cm LEFT ATRIUM             Index LA diam:        3.10 cm 1.70 cm/m LA Vol (A2C):   28.3 ml 15.53 ml/m LA Vol (A4C):   22.9 ml 12.57 ml/m LA Biplane Vol: 25.6 ml 14.05 ml/m  AORTIC VALVE AV Area (Vmax):    2.23 cm AV Area (Vmean):   2.14 cm AV Area (VTI):     2.12 cm AV Vmax:           194.00 cm/s AV Vmean:          133.500 cm/s AV VTI:            0.399 m AV Peak Grad:      15.1 mmHg AV Mean Grad:      8.5 mmHg LVOT Vmax:         138.00 cm/s LVOT Vmean:        90.900 cm/s LVOT VTI:          0.270 m LVOT/AV VTI ratio: 0.67  AORTA Ao Root diam: 3.40 cm Ao Asc diam:  3.40 cm MITRAL VALVE MV Area (PHT): 3.08 cm     SHUNTS MV Decel Time: 246 msec     Systemic VTI:  0.27 m MV E velocity: 91.20 cm/s   Systemic Diam: 2.00 cm MV A velocity: 162.00 cm/s MV E/A ratio:  0.56 [name] MD Electronically signed by [name] MD Signature  Date/Time: 04/10/2022/4:51:44 PM    Final     EEG adult   Result Date: 04/09/2022 Derek Jack, MD     04/09/2022  7:49 PM Routine EEG Report Grant Jensen. is a 89 y.o. male with a history of spells and altered mental status who is undergoing an EEG to evaluate for seizures. Report: This EEG was acquired with electrodes placed according to the International 10-20 electrode system (including Fp1, Fp2, F3, F4, C3, C4, P3, P4, O1, O2, T3, T4, T5, T6, A1, A2, Fz, Cz, Pz). The following electrodes were missing or displaced: none. The occipital dominant rhythm was 6-8 Hz. This activity is reactive to stimulation. Drowsiness was manifested by background fragmentation; deeper stages of sleep were not identified. There was no focal slowing. There were no interictal epileptiform discharges. There were no electrographic seizures identified. Hyperventilation and photic stimulation were not performed. Impression and clinical correlation: This EEG was obtained while awake and drowsy and is abnormal due to mild diffuse slowing indicative of global cerebral dysfunction. Epileptiform abnormalities were not seen during this recording. Su Monks, MD [facility] [phone] If 7pm- 7am, please page neurology on call as listed in [place].  MR BRAIN WO CONTRAST   Result Date: 04/09/2022 CLINICAL DATA:  Left-sided weakness EXAM: MRI HEAD WITHOUT CONTRAST TECHNIQUE: Multiplanar, multiecho pulse sequences of the brain and surrounding structures were obtained without intravenous contrast. COMPARISON:  Same day CT stroke code and CTA head/neck angiogram. FINDINGS: Brain: Small regions of acute infarct in the periventricular right posterior frontal lobe/corona radiata (series 4, image 40). Evidence of hemorrhage. There is sequela of moderate chronic microvascular ischemic change. No hemorrhage. Advanced generalized volume loss. Vascular: Normal flow voids. Skull and upper cervical spine: Redemonstrated is mild  narrowing at the craniocervical junction. Sinuses/Orbits: Bilateral lens replacement. Paranasal sinuses are clear. Other: None. IMPRESSION: Small foci of acute infarct in the right periventricular posterior frontal lobe/corona radiata. No hemorrhage or mass effect. Electronically Signed   By: Marin Roberts M.D.   On: 04/09/2022 15:26       Assessment/Plan: Diagnosis: Acute right periventricular posterior frontal lobe infarct due to small vessel disease with left hemiparesis. Does the need for close, 24 hr/day medical supervision in concert with the patient's rehab needs make it unreasonable for this patient to be served in a less intensive setting? Yes Co-Morbidities requiring supervision/potential complications: Legally blind due to glaucoma, hypertension, uncontrolled diabetes, dementia. Due to bladder management, bowel management, safety, skin/wound care, disease management, medication administration, pain management, and patient education, does the patient require 24 hr/day rehab nursing? Yes Does the patient require coordinated care of a physician, rehab nurse, therapy disciplines of PT, OT, +/- SLP to address physical and functional deficits in the context of the above medical diagnosis(es)? Yes Addressing deficits in the following areas: balance, endurance, locomotion, strength, transferring, bowel/bladder control, bathing, dressing, feeding, grooming, toileting, cognition, speech, and psychosocial support Can the patient actively participate in an intensive therapy program of at least 3 hrs of therapy per day at least 5 days per week? Yes The potential for patient to make measurable gains while on inpatient rehab is good Anticipated functional outcomes upon discharge from inpatient rehab are min assist  with PT, min assist with OT, min assist and max assist with SLP. Estimated rehab length of stay to reach the above functional goals is: 7-10 days Anticipated discharge destination: Home Overall  Rehab/Functional Prognosis: good   POST ACUTE RECOMMENDATIONS: This patient's condition is appropriate for continued rehabilitative care in the following setting: CIR Patient's wife has agreed to participate in recommended program. Yes Note that insurance prior authorization may be required for reimbursement for recommended care.   Comment: Mr. Plona had care needs at baseline.  However he was physically able to assist with transfers and gait which made his care manageable for his wife.  He was attending adult daycare 3 times a week and out in the community with his wife.  Since he has had a stroke, his difficulty with his left-sided coordination and initiation has impacted those abilities.  I think he could benefit from a 7 to 10-day inpatient rehab admission to address his lower extremity strength and functional mobility so that he and his wife are more easily able to manage at home.  He is a willing and able participant in therapy.  Wife is very motivated and engaged and will be part of the process also.   Rehab Admissions Coordinator to follow up.   Thanks,   Grant Staggers, MD 04/11/2022          Routing History

## 2022-04-14 NOTE — Progress Notes (Signed)
Mobility Specialist: Progress Note   04/14/22 1125  Mobility  Activity Refused mobility   Pt refused mobility stating "No, I don't feel like it." Despite encouragement pt still refusing. Assisted pt with pericare with help from NT d/t urinary incontinence. Pt is in the bed with call bell at his side. Will f/u as able.   [place] Mobility Specialist Please contact via SecureChat or Rehab office at [phone]

## 2022-04-14 NOTE — Progress Notes (Signed)
Patient admitted to unit. Write went over rehab process and fall safety measures with patient. Patient will need reinforcement. Patient alert to self only, denies pain. No family at bedside at this time. Will defer admission questions to next shift once family becomes available.

## 2022-04-14 NOTE — TOC Transition Note (Signed)
Transition of Care [facility]) - CM/SW Discharge Note   Patient Details  Name: Grant Jensen. MRN: [id number] Date of Birth: 12/29/1928  [facility]) CM/SW Contact:  Pollie Friar, RN Phone Number: 04/14/2022, 11:08 AM   Clinical Narrative:    Pt is discharging to CIR today. CM signing off.   Final next level of care: IP Rehab Facility Barriers to Discharge: No Barriers Identified   Patient Goals and CMS Choice CMS Medicare.gov Compare Post Acute Care list provided to:: Patient Choice offered to / list presented to : Patient  Discharge Placement                         Discharge Plan and Services Additional resources added to the After Visit Summary for                                       Social Determinants of Health (SDOH) Interventions SDOH Screenings   Food Insecurity: No Food Insecurity (04/09/2022)  Housing: Low Risk  (04/09/2022)  Transportation Needs: No Transportation Needs (04/10/2022)  Utilities: At Risk (04/10/2022)  Tobacco Use: Unknown (04/10/2022)     Readmission Risk Interventions     No data to display

## 2022-04-14 NOTE — Discharge Summary (Signed)
Physician Discharge Summary  Grant Jensen. [id number] DOB: 11-11-1928 DOA: 04/09/2022  PCP: [place] date: 04/09/2022 Discharge date: 04/14/2022  Admitted From: Home Disposition: Inpatient rehab  Recommendations for Outpatient Follow-up:  Follow up with PCP in 1-2 weeks Follow-up with neurology as scheduled  Discharge Condition: Stable CODE STATUS: DNR Diet recommendation: Low-salt low-fat diabetic diet  Brief/Interim Summary: Grant Jensen. is a 89 y.o. male with medical history significant of hypertension, diabetes, dementia, nonischemic cardiomyopathy, glaucoma presenting with left-sided neurologic deficits. History obtained with assistance of chart review and family. Patient woke up around 7 AM and is having trouble getting out of bed. Wife noticed that he was leaning to the left and appeared weaker on his left side.  She additionally noted left facial droop and some slurred speech.  She called EMS who transported patient to the ED for further evaluation.  She has additionally reported some shaking episodes this morning but remains alert during the episodes and responsive.  He does have dementia and at his baseline he knows his name but he he can have difficulty with remembering places or date.  Patient admitted as above, imaging positive for acute CVA, small acute infarct of the right periventricular posterior frontal lobe/corona radiata without hemorrhage or mass effect.  Neurology following recommending aspirin Plavix for 21 days then transition to Plavix alone thereafter.  Echo shows grade 1 diastolic dysfunction otherwise EEG and labs within normal limits other than elevated A1c.  At this time patient's blood pressure is more appropriately well-controlled, continue statin per neurology at discharge.  Patient's A1c as above is markedly elevated at 10.1, point-of-care glucose readings improved drastically with appropriate diet - no indication for  insulin at discharge - resume PO medications for diabetes control.  Discharge Diagnoses:  Principal Problem:   Acute CVA (cerebrovascular accident) ([place]) Active Problems:   Dementia ([place])   Diabetes ([place])   Essential hypertension   Acute CVA - Small foci of acute infarct in the right periventricular posterior frontal lobe/corona radiata. No hemorrhage or mass effect.  - ASA 81 mg PO daily for 21 days - Plavix 75 mg PO daily ongoing - Crestor 20 mg PO daily  - ECHO 60-65%, no WMA's, grade I diastolic dysfunction  - Inpt rehab at discharge   HTN, essential - Well controlled   DM uncontrolled with hyperglycemia (A1c 10.1) - Resume prior home medications - no indication for insulin given marked control with improved diet.   Dementia - Aricept 5 mg PO daily  - Atarax 10 mg PO PRN    Glaucoma - Per documentation, pt declines to take eye drops (re-ordered if he becomes compliant)   Obesity BMI ? 30 - 39.9 - Obesity  (32 kg/m2) - Complicating care    CKD Stage 3b - GFR 30 to 44 (Moderately to severely decreased)  - At baseline  Discharge Instructions   Allergies as of 04/14/2022       Reactions   Lisinopril Cough   Penicillin G Hives, Rash        Medication List     STOP taking these medications    isosorbide mononitrate 30 MG 24 hr tablet Commonly known as: IMDUR   losartan 25 MG tablet Commonly known as: COZAAR   metoprolol succinate 25 MG 24 hr tablet Commonly known as: TOPROL-XL   omeprazole 20 MG capsule Commonly known as: PRILOSEC   predniSONE 50 MG tablet Commonly known as: DELTASONE  TAKE these medications    acetaminophen 325 MG tablet Commonly known as: TYLENOL Take 2 tablets (650 mg total) by mouth every 6 (six) hours as needed for moderate pain.   albuterol 108 (90 Base) MCG/ACT inhaler Commonly known as: VENTOLIN HFA Inhale 2 puffs into the lungs every 4 (four) hours as needed for wheezing or shortness of breath.   aspirin EC  81 MG tablet Take 1 tablet (81 mg total) by mouth daily for 15 days. Swallow whole. Start taking on: April 15, 2022 What changed: additional instructions   clopidogrel 75 MG tablet Commonly known as: PLAVIX Take 1 tablet (75 mg total) by mouth daily. Start taking on: April 15, 2022   donepezil 5 MG tablet Commonly known as: ARICEPT Take 5 mg by mouth at bedtime.   glimepiride 1 MG tablet Commonly known as: AMARYL Take 1 mg by mouth every morning.   hydrOXYzine 25 MG tablet Commonly known as: ATARAX Take 1 at night as needed for sleep What changed:  how much to take how to take this when to take this reasons to take this   ipratropium-albuterol 0.5-2.5 (3) MG/3ML Soln Commonly known as: DUONEB Take 3 mLs by nebulization every 6 (six) hours as needed (SOB).   latanoprost 0.005 % ophthalmic solution Commonly known as: XALATAN Place 1 drop into both eyes at bedtime.   metFORMIN 500 MG tablet Commonly known as: GLUCOPHAGE Take 1 tablet (500 mg total) by mouth daily with breakfast. What changed: when to take this   pantoprazole 40 MG tablet Commonly known as: Protonix Take 1 tablet (40 mg total) by mouth daily.   rosuvastatin 20 MG tablet Commonly known as: CRESTOR Take 1 tablet (20 mg total) by mouth daily. Start taking on: April 15, 2022        Allergies  Allergen Reactions   Lisinopril Cough   Penicillin G Hives and Rash    Consultations: Neuro   Procedures/Studies: ECHOCARDIOGRAM COMPLETE  Result Date: 04/10/2022    ECHOCARDIOGRAM REPORT   Patient Name:   Grant Jensen. Date of Exam: 04/10/2022 Medical Rec #:  [id number]          Height:       67.0 in Accession #:    [id number]         Weight:       156.5 lb Date of Birth:  03-18-1928          BSA:          1.822 m Patient Age:    11 years           BP:           107/51 mmHg Patient Gender: M                  HR:           72 bpm. Exam Location:  Inpatient Procedure: 2D Echo, Cardiac Doppler  and Color Doppler Indications:    Stroke I63.9  History:        Patient has no prior history of Echocardiogram examinations.                 Cardiomyopathy, Signs/Symptoms:Chest Pain; Risk                 Factors:Hypertension and Diabetes.  Sonographer:    Ronny Flurry Referring Phys: [id number] [place]  1. Left ventricular ejection fraction, by estimation, is 60 to 65%. The left ventricle has normal function.  The left ventricle has no regional wall motion abnormalities. There is mild concentric left ventricular hypertrophy. Left ventricular diastolic parameters are consistent with Grade I diastolic dysfunction (impaired relaxation).  2. Right ventricular systolic function is normal. The right ventricular size is normal.  3. The mitral valve is degenerative. Mild mitral valve regurgitation. No evidence of mitral stenosis. Moderate mitral annular calcification.  4. The aortic valve is tricuspid. There is moderate calcification of the aortic valve. Aortic valve regurgitation is not visualized. No aortic stenosis is present.  5. The inferior vena cava is normal in size with greater than 50% respiratory variability, suggesting right atrial pressure of 3 mmHg. FINDINGS  Left Ventricle: Left ventricular ejection fraction, by estimation, is 60 to 65%. The left ventricle has normal function. The left ventricle has no regional wall motion abnormalities. The left ventricular internal cavity size was normal in size. There is  mild concentric left ventricular hypertrophy. Left ventricular diastolic parameters are consistent with Grade I diastolic dysfunction (impaired relaxation). Right Ventricle: The right ventricular size is normal. No increase in right ventricular wall thickness. Right ventricular systolic function is normal. Left Atrium: Left atrial size was normal in size. Right Atrium: Right atrial size was normal in size. Pericardium: There is no evidence of pericardial effusion. Mitral Valve: The  mitral valve is degenerative in appearance. There is mild calcification of the mitral valve leaflet(s). Moderate mitral annular calcification. Mild mitral valve regurgitation. No evidence of mitral valve stenosis. Tricuspid Valve: The tricuspid valve is normal in structure. Tricuspid valve regurgitation is trivial. No evidence of tricuspid stenosis. Aortic Valve: The aortic valve is tricuspid. There is moderate calcification of the aortic valve. Aortic valve regurgitation is not visualized. No aortic stenosis is present. Aortic valve mean gradient measures 8.5 mmHg. Aortic valve peak gradient measures 15.1 mmHg. Aortic valve area, by VTI measures 2.12 cm. Pulmonic Valve: The pulmonic valve was normal in structure. Pulmonic valve regurgitation is trivial. No evidence of pulmonic stenosis. Aorta: The aortic root is normal in size and structure. Venous: The inferior vena cava was not well visualized. The inferior vena cava is normal in size with greater than 50% respiratory variability, suggesting right atrial pressure of 3 mmHg. IAS/Shunts: No atrial level shunt detected by color flow Doppler.  LEFT VENTRICLE PLAX 2D LVIDd:         3.20 cm   Diastology LVIDs:         2.50 cm   LV e' medial:    4.90 cm/s LV PW:         1.50 cm   LV E/e' medial:  18.6 LV IVS:        0.90 cm   LV e' lateral:   6.53 cm/s LVOT diam:     2.00 cm   LV E/e' lateral: 14.0 LV SV:         85 LV SV Index:   46 LVOT Area:     3.14 cm  RIGHT VENTRICLE RV S prime:     5.66 cm/s TAPSE (M-mode): 1.2 cm LEFT ATRIUM             Index LA diam:        3.10 cm 1.70 cm/m LA Vol (A2C):   28.3 ml 15.53 ml/m LA Vol (A4C):   22.9 ml 12.57 ml/m LA Biplane Vol: 25.6 ml 14.05 ml/m  AORTIC VALVE AV Area (Vmax):    2.23 cm AV Area (Vmean):   2.14 cm AV Area (VTI):  2.12 cm AV Vmax:           194.00 cm/s AV Vmean:          133.500 cm/s AV VTI:            0.399 m AV Peak Grad:      15.1 mmHg AV Mean Grad:      8.5 mmHg LVOT Vmax:         138.00 cm/s LVOT  Vmean:        90.900 cm/s LVOT VTI:          0.270 m LVOT/AV VTI ratio: 0.67  AORTA Ao Root diam: 3.40 cm Ao Asc diam:  3.40 cm MITRAL VALVE MV Area (PHT): 3.08 cm     SHUNTS MV Decel Time: 246 msec     Systemic VTI:  0.27 m MV E velocity: 91.20 cm/s   Systemic Diam: 2.00 cm MV A velocity: 162.00 cm/s MV E/A ratio:  0.56 [name] MD Electronically signed by [name] MD Signature Date/Time: 04/10/2022/4:51:44 PM    Final    EEG adult  Result Date: 04/09/2022 Derek Jack, MD     04/09/2022  7:49 PM Routine EEG Report Grant Jensen. is a 89 y.o. male with a history of spells and altered mental status who is undergoing an EEG to evaluate for seizures. Report: This EEG was acquired with electrodes placed according to the International 10-20 electrode system (including Fp1, Fp2, F3, F4, C3, C4, P3, P4, O1, O2, T3, T4, T5, T6, A1, A2, Fz, Cz, Pz). The following electrodes were missing or displaced: none. The occipital dominant rhythm was 6-8 Hz. This activity is reactive to stimulation. Drowsiness was manifested by background fragmentation; deeper stages of sleep were not identified. There was no focal slowing. There were no interictal epileptiform discharges. There were no electrographic seizures identified. Hyperventilation and photic stimulation were not performed. Impression and clinical correlation: This EEG was obtained while awake and drowsy and is abnormal due to mild diffuse slowing indicative of global cerebral dysfunction. Epileptiform abnormalities were not seen during this recording. Su Monks, MD [facility] [phone] If 7pm- 7am, please page neurology on call as listed in [place].   MR BRAIN WO CONTRAST  Result Date: 04/09/2022 CLINICAL DATA:  Left-sided weakness EXAM: MRI HEAD WITHOUT CONTRAST TECHNIQUE: Multiplanar, multiecho pulse sequences of the brain and surrounding structures were obtained without intravenous contrast. COMPARISON:  Same day CT stroke code  and CTA head/neck angiogram. FINDINGS: Brain: Small regions of acute infarct in the periventricular right posterior frontal lobe/corona radiata (series 4, image 40). Evidence of hemorrhage. There is sequela of moderate chronic microvascular ischemic change. No hemorrhage. Advanced generalized volume loss. Vascular: Normal flow voids. Skull and upper cervical spine: Redemonstrated is mild narrowing at the craniocervical junction. Sinuses/Orbits: Bilateral lens replacement. Paranasal sinuses are clear. Other: None. IMPRESSION: Small foci of acute infarct in the right periventricular posterior frontal lobe/corona radiata. No hemorrhage or mass effect. Electronically Signed   By: Marin Roberts M.D.   On: 04/09/2022 15:26   DG Chest Portable 1 View  Result Date: 04/09/2022 CLINICAL DATA:  Rule out pneumonia. Right years. Slurred speech and mild left arm pain and left leg weakness. EXAM: PORTABLE CHEST 1 VIEW COMPARISON:  12/06/2021 FINDINGS: Stable cardiomediastinal contours. No pleural effusion or edema. Chronic bronchitic changes are identified bilaterally. No superimposed airspace consolidation. Visualized osseous structures are unremarkable. IMPRESSION: 1. No acute findings. 2. Chronic bronchitic changes. Electronically Signed   By: Lovena Le  Clovis Riley M.D.   On: 04/09/2022 13:17   CT ANGIO HEAD NECK W WO CM (CODE STROKE)  Result Date: 04/09/2022 CLINICAL DATA:  Slurred speech.  Left-sided weakness. EXAM: CT ANGIOGRAPHY HEAD AND NECK TECHNIQUE: Multidetector CT imaging of the head and neck was performed using the standard protocol during bolus administration of intravenous contrast. Multiplanar CT image reconstructions and MIPs were obtained to evaluate the vascular anatomy. Carotid stenosis measurements (when applicable) are obtained utilizing NASCET criteria, using the distal internal carotid diameter as the denominator. RADIATION DOSE REDUCTION: This exam was performed according to the departmental  dose-optimization program which includes automated exposure control, adjustment of the mA and/or kV according to patient size and/or use of iterative reconstruction technique. CONTRAST:  75 ml Omni 350 COMPARISON:  CT Head 04/30/21 FINDINGS: CT HEAD FINDINGS Brain: No evidence of acute infarction, hemorrhage, hydrocephalus, extra-axial collection or mass lesion/mass effect. Is sequela of severe chronic microvascular ischemic change with advanced generalized volume loss. No evidence of hydrocephalus. Vascular: No hyperdense vessel or unexpected calcification. Skull: Normal. Negative for fracture or focal lesion. Sinuses/Orbits: No mastoid or middle ear effusion. Paranasal sinuses are clear. Bilateral lens replacement. Orbits are otherwise unremarkable. Other: None. Review of the MIP images confirms the above findings CTA NECK FINDINGS Aortic arch: Standard branching. Imaged portion shows no evidence of aneurysm or dissection. No significant stenosis of the major arch vessel origins. Right carotid system: No evidence of dissection, stenosis (50% or greater), or occlusion. Mild atherosclerotic calcification at the carotid bifurcation Left carotid system: No evidence of dissection, stenosis (50% or greater), or occlusion. Vertebral arteries: Moderate stenosis of the origin of the left vertebral artery secondary to calcified atherosclerotic plaque. Skeleton: There are degenerative changes at C1-C2 articulation where there is a large pannus and ligamentum flavum hypertrophy. This results in moderate spinal canal stenosis at the craniocervical junction. Other neck: Negative. Upper chest: Biapical bronchial wall thickening as can be seen in the setting of bronchitis or small airways disease. Review of the MIP images confirms the above findings CTA HEAD FINDINGS Anterior circulation: 1 of the proximal M2 branches on the right is likely occluded (series 7, image 99). ) Posterior circulation: Moderate focal stenosis in the P2  segment of the right PCA (series 11, image 77). Venous sinuses: As permitted by contrast timing, patent. Anatomic variants: None Review of the MIP images confirms the above findings IMPRESSION: 1. No hemorrhage or CT evidence of an acute infarct.  Aspects is 10. 2. One of the proximal M2 branches on the right is severely stenosed to nearly occluded. 3. Moderate focal stenosis in the P2 segment of the right PCA. 4. Moderate stenosis of the origin of the left vertebral artery. 5. Moderate spinal canal stenosis at the craniocervical junction due to ligamentum falvum hypertrophy and pannus. Findings were paged to Dr. Lorrin Goodell on 04/09/22 at 12:50 PM via [facility] paging system. Electronically Signed   By: Marin Roberts M.D.   On: 04/09/2022 12:57   CT HEAD CODE STROKE WO CONTRAST`  Result Date: 04/09/2022 CLINICAL DATA:  Slurred speech.  Left-sided weakness. EXAM: CT ANGIOGRAPHY HEAD AND NECK TECHNIQUE: Multidetector CT imaging of the head and neck was performed using the standard protocol during bolus administration of intravenous contrast. Multiplanar CT image reconstructions and MIPs were obtained to evaluate the vascular anatomy. Carotid stenosis measurements (when applicable) are obtained utilizing NASCET criteria, using the distal internal carotid diameter as the denominator. RADIATION DOSE REDUCTION: This exam was performed according to the departmental dose-optimization program  which includes automated exposure control, adjustment of the mA and/or kV according to patient size and/or use of iterative reconstruction technique. CONTRAST:  75 ml Omni 350 COMPARISON:  CT Head 04/30/21 FINDINGS: CT HEAD FINDINGS Brain: No evidence of acute infarction, hemorrhage, hydrocephalus, extra-axial collection or mass lesion/mass effect. Is sequela of severe chronic microvascular ischemic change with advanced generalized volume loss. No evidence of hydrocephalus. Vascular: No hyperdense vessel or unexpected calcification. Skull:  Normal. Negative for fracture or focal lesion. Sinuses/Orbits: No mastoid or middle ear effusion. Paranasal sinuses are clear. Bilateral lens replacement. Orbits are otherwise unremarkable. Other: None. Review of the MIP images confirms the above findings CTA NECK FINDINGS Aortic arch: Standard branching. Imaged portion shows no evidence of aneurysm or dissection. No significant stenosis of the major arch vessel origins. Right carotid system: No evidence of dissection, stenosis (50% or greater), or occlusion. Mild atherosclerotic calcification at the carotid bifurcation Left carotid system: No evidence of dissection, stenosis (50% or greater), or occlusion. Vertebral arteries: Moderate stenosis of the origin of the left vertebral artery secondary to calcified atherosclerotic plaque. Skeleton: There are degenerative changes at C1-C2 articulation where there is a large pannus and ligamentum flavum hypertrophy. This results in moderate spinal canal stenosis at the craniocervical junction. Other neck: Negative. Upper chest: Biapical bronchial wall thickening as can be seen in the setting of bronchitis or small airways disease. Review of the MIP images confirms the above findings CTA HEAD FINDINGS Anterior circulation: 1 of the proximal M2 branches on the right is likely occluded (series 7, image 99). ) Posterior circulation: Moderate focal stenosis in the P2 segment of the right PCA (series 11, image 77). Venous sinuses: As permitted by contrast timing, patent. Anatomic variants: None Review of the MIP images confirms the above findings IMPRESSION: 1. No hemorrhage or CT evidence of an acute infarct.  Aspects is 10. 2. One of the proximal M2 branches on the right is severely stenosed to nearly occluded. 3. Moderate focal stenosis in the P2 segment of the right PCA. 4. Moderate stenosis of the origin of the left vertebral artery. 5. Moderate spinal canal stenosis at the craniocervical junction due to ligamentum falvum  hypertrophy and pannus. Findings were paged to Dr. Lorrin Goodell on 04/09/22 at 12:50 PM via [facility] paging system. Electronically Signed   By: Marin Roberts M.D.   On: 04/09/2022 12:57     Subjective: No acute issues/events overnight   Discharge Exam: Vitals:   04/14/22 0400 04/14/22 0727  BP: 134/69 (!) 150/62  Pulse: 67 66  Resp: 20 17  Temp: 98.2 F (36.8 C) 98.2 F (36.8 C)  SpO2: 100% 100%   Vitals:   04/13/22 1700 04/14/22 0000 04/14/22 0400 04/14/22 0727  BP: 121/69 130/64 134/69 (!) 150/62  Pulse: 67 63 67 66  Resp: 17 16 20 17  $ Temp: 98 F (36.7 C) 97.6 F (36.4 C) 98.2 F (36.8 C) 98.2 F (36.8 C)  TempSrc: Oral Oral Oral Oral  SpO2: 100% 100% 100% 100%  Weight:      Height:       General: Pt is alert, awake, not in acute distress Cardiovascular: RRR, S1/S2 +, no rubs, no gallops Respiratory: CTA bilaterally, no wheezing, no rhonchi Abdominal: Soft, NT, ND, bowel sounds + Extremities: no edema, no cyanosis  The results of significant diagnostics from this hospitalization (including imaging, microbiology, ancillary and laboratory) are listed below for reference.     Microbiology: Recent Results (from the past 240 hour(s))  Resp panel  by RT-PCR (RSV, Flu A&B, Covid) Anterior Nasal Swab     Status: None   Collection Time: 04/09/22 12:37 PM   Specimen: Anterior Nasal Swab  Result Value Ref Range Status   SARS Coronavirus 2 by RT PCR NEGATIVE NEGATIVE Final   Influenza A by PCR NEGATIVE NEGATIVE Final   Influenza B by PCR NEGATIVE NEGATIVE Final    Comment: (NOTE) The Xpert Xpress SARS-CoV-2/FLU/RSV plus assay is intended as an aid in the diagnosis of influenza from Nasopharyngeal swab specimens and should not be used as a sole basis for treatment. Nasal washings and aspirates are unacceptable for Xpert Xpress SARS-CoV-2/FLU/RSV testing.  Fact Sheet for Patients: EntrepreneurPulse.com.au  Fact Sheet for Healthcare  Providers: IncredibleEmployment.be  This test is not yet approved or cleared by the [place] FDA and has been authorized for detection and/or diagnosis of SARS-CoV-2 by FDA under an Emergency Use Authorization (EUA). This EUA will remain in effect (meaning this test can be used) for the duration of the COVID-19 declaration under Section 564(b)(1) of the Act, 21 U.S.C. section 360bbb-3(b)(1), unless the authorization is terminated or revoked.     Resp Syncytial Virus by PCR NEGATIVE NEGATIVE Final    Comment: (NOTE) Fact Sheet for Patients: EntrepreneurPulse.com.au  Fact Sheet for Healthcare Providers: IncredibleEmployment.be  This test is not yet approved or cleared by the [place] FDA and has been authorized for detection and/or diagnosis of SARS-CoV-2 by FDA under an Emergency Use Authorization (EUA). This EUA will remain in effect (meaning this test can be used) for the duration of the COVID-19 declaration under Section 564(b)(1) of the Act, 21 U.S.C. section 360bbb-3(b)(1), unless the authorization is terminated or revoked.  Performed at [facility], [place] [address], [place], [place] [address]      Labs: Basic Metabolic Panel: Recent Labs  Lab 04/09/22 1210 04/09/22 1230 04/10/22 0302 04/11/22 0609 04/13/22 0319 04/14/22 0616  NA 135 137 136 136 136 138  K 4.0 4.0 4.3 4.0 4.2 4.4  CL 104 104 104 105 103 102  CO2 20*  --  20* 20* 23 22  GLUCOSE 162* 160* 82 131* 125* 110*  BUN 15 18 13 14 17 16  $ CREATININE 1.64* 1.70* 1.50* 1.50* 1.69* 1.48*  CALCIUM 9.0  --  8.7* 8.8* 9.0 9.2  MG  --   --   --  1.8 1.9 2.1   Liver Function Tests: Recent Labs  Lab 04/09/22 1210 04/10/22 0302 04/11/22 0609 04/13/22 0319 04/14/22 0616  AST 32 24 30 28 29  $ ALT 16 14 17 17 18  $ ALKPHOS 57 46 62 52 53  BILITOT 0.3 0.7 0.4 0.5 0.3  PROT 6.6 5.9* 6.7 6.1* 6.2*  ALBUMIN 3.9 3.4* 3.7 3.5 3.7    CBC: Recent Labs  Lab 04/09/22 1210 04/09/22 1230 04/10/22 0302 04/11/22 0609 04/14/22 0442  WBC 7.5  --  6.1 7.2 7.6  NEUTROABS 2.4  --   --   --   --   HGB 11.3* 13.3 10.6* 11.4* 12.2*  HCT 37.0* 39.0 32.7* 35.2* 39.0  MCV 79.7*  --  77.1* 78.0* 78.5*  PLT 171  --  197 178 180   CBG: Recent Labs  Lab 04/13/22 0614 04/13/22 1154 04/13/22 1703 04/13/22 2202 04/14/22 0631  GLUCAP 108* 135* 184* 206* 106*   Urinalysis    Component Value Date/Time   COLORURINE STRAW (A) 04/30/2021 0346   APPEARANCEUR CLEAR 04/30/2021 0346   LABSPEC 1.010 04/30/2021 [place]  7.0 04/30/2021 0346   GLUCOSEU NEGATIVE 04/30/2021 0346   HGBUR NEGATIVE 04/30/2021 0346   BILIRUBINUR NEGATIVE 04/30/2021 0346   KETONESUR NEGATIVE 04/30/2021 0346   PROTEINUR NEGATIVE 04/30/2021 0346   NITRITE NEGATIVE 04/30/2021 0346   LEUKOCYTESUR NEGATIVE 04/30/2021 0346   Sepsis Labs Recent Labs  Lab 04/09/22 1210 04/10/22 0302 04/11/22 0609 04/14/22 0442  WBC 7.5 6.1 7.2 7.6   Microbiology Recent Results (from the past 240 hour(s))  Resp panel by RT-PCR (RSV, Flu A&B, Covid) Anterior Nasal Swab     Status: None   Collection Time: 04/09/22 12:37 PM   Specimen: Anterior Nasal Swab  Result Value Ref Range Status   SARS Coronavirus 2 by RT PCR NEGATIVE NEGATIVE Final   Influenza A by PCR NEGATIVE NEGATIVE Final   Influenza B by PCR NEGATIVE NEGATIVE Final    Comment: (NOTE) The Xpert Xpress SARS-CoV-2/FLU/RSV plus assay is intended as an aid in the diagnosis of influenza from Nasopharyngeal swab specimens and should not be used as a sole basis for treatment. Nasal washings and aspirates are unacceptable for Xpert Xpress SARS-CoV-2/FLU/RSV testing.  Fact Sheet for Patients: EntrepreneurPulse.com.au  Fact Sheet for Healthcare Providers: IncredibleEmployment.be  This test is not yet approved or cleared by the [place] FDA and has been authorized  for detection and/or diagnosis of SARS-CoV-2 by FDA under an Emergency Use Authorization (EUA). This EUA will remain in effect (meaning this test can be used) for the duration of the COVID-19 declaration under Section 564(b)(1) of the Act, 21 U.S.C. section 360bbb-3(b)(1), unless the authorization is terminated or revoked.     Resp Syncytial Virus by PCR NEGATIVE NEGATIVE Final    Comment: (NOTE) Fact Sheet for Patients: EntrepreneurPulse.com.au  Fact Sheet for Healthcare Providers: IncredibleEmployment.be  This test is not yet approved or cleared by the [place] FDA and has been authorized for detection and/or diagnosis of SARS-CoV-2 by FDA under an Emergency Use Authorization (EUA). This EUA will remain in effect (meaning this test can be used) for the duration of the COVID-19 declaration under Section 564(b)(1) of the Act, 21 U.S.C. section 360bbb-3(b)(1), unless the authorization is terminated or revoked.  Performed at [facility], [place] [address], [place], [place] [address]      Time coordinating discharge: Over 30 minutes  SIGNED:   Little Ishikawa, DO [facility] 04/14/2022, 11:10 AM Pager   If 7PM-7AM, please contact night-coverage [email or web address]

## 2022-04-14 NOTE — Plan of Care (Signed)
Pt is discharged to [facility] for rehab. PIV removed. All belongings returned to patient's wife.   Problem: Education: Goal: Knowledge of disease or condition will improve Outcome: Completed/Met Goal: Knowledge of secondary prevention will improve (MUST DOCUMENT ALL) Outcome: Completed/Met Goal: Knowledge of patient specific risk factors will improve Elta [place] N/A or DELETE if not current risk factor) Outcome: Completed/Met   Problem: Ischemic Stroke/TIA Tissue Perfusion: Goal: Complications of ischemic stroke/TIA will be minimized Outcome: Completed/Met   Problem: Coping: Goal: Will verbalize positive feelings about self Outcome: Completed/Met Goal: Will identify appropriate support needs Outcome: Completed/Met   Problem: Health Behavior/Discharge Planning: Goal: Ability to manage health-related needs will improve Outcome: Completed/Met Goal: Goals will be collaboratively established with patient/family Outcome: Completed/Met   Problem: Self-Care: Goal: Ability to participate in self-care as condition permits will improve Outcome: Completed/Met Goal: Verbalization of feelings and concerns over difficulty with self-care will improve Outcome: Completed/Met Goal: Ability to communicate needs accurately will improve Outcome: Completed/Met   Problem: Nutrition: Goal: Risk of aspiration will decrease Outcome: Completed/Met Goal: Dietary intake will improve Outcome: Completed/Met   Problem: Education: Goal: Ability to describe self-care measures that may prevent or decrease complications (Diabetes Survival Skills Education) will improve Outcome: Completed/Met Goal: Individualized Educational Video(s) Outcome: Completed/Met   Problem: Coping: Goal: Ability to adjust to condition or change in health will improve Outcome: Completed/Met   Problem: Fluid Volume: Goal: Ability to maintain a balanced intake and output will improve Outcome: Completed/Met   Problem: Health  Behavior/Discharge Planning: Goal: Ability to identify and utilize available resources and services will improve Outcome: Completed/Met Goal: Ability to manage health-related needs will improve Outcome: Completed/Met   Problem: Metabolic: Goal: Ability to maintain appropriate glucose levels will improve Outcome: Completed/Met   Problem: Nutritional: Goal: Maintenance of adequate nutrition will improve Outcome: Completed/Met Goal: Progress toward achieving an optimal weight will improve Outcome: Completed/Met   Problem: Skin Integrity: Goal: Risk for impaired skin integrity will decrease Outcome: Completed/Met   Problem: Tissue Perfusion: Goal: Adequacy of tissue perfusion will improve Outcome: Completed/Met   Problem: Education: Goal: Knowledge of General Education information will improve Description: Including pain rating scale, medication(s)/side effects and non-pharmacologic comfort measures Outcome: Completed/Met   Problem: Health Behavior/Discharge Planning: Goal: Ability to manage health-related needs will improve Outcome: Completed/Met   Problem: Clinical Measurements: Goal: Ability to maintain clinical measurements within normal limits will improve Outcome: Completed/Met Goal: Will remain free from infection Outcome: Completed/Met Goal: Diagnostic test results will improve Outcome: Completed/Met Goal: Respiratory complications will improve Outcome: Completed/Met Goal: Cardiovascular complication will be avoided Outcome: Completed/Met   Problem: Activity: Goal: Risk for activity intolerance will decrease Outcome: Completed/Met   Problem: Nutrition: Goal: Adequate nutrition will be maintained Outcome: Completed/Met   Problem: Coping: Goal: Level of anxiety will decrease Outcome: Completed/Met   Problem: Elimination: Goal: Will not experience complications related to bowel motility Outcome: Completed/Met Goal: Will not experience complications related  to urinary retention Outcome: Completed/Met   Problem: Pain Managment: Goal: General experience of comfort will improve Outcome: Completed/Met   Problem: Safety: Goal: Ability to remain free from injury will improve Outcome: Completed/Met   Problem: Skin Integrity: Goal: Risk for impaired skin integrity will decrease Outcome: Completed/Met   Problem: Education: Goal: Knowledge of General Education information will improve Description: Including pain rating scale, medication(s)/side effects and non-pharmacologic comfort measures Outcome: Completed/Met   Problem: Health Behavior/Discharge Planning: Goal: Ability to manage health-related needs will improve Outcome: Completed/Met   Problem: Clinical Measurements: Goal: Ability to maintain clinical measurements  within normal limits will improve Outcome: Completed/Met Goal: Will remain free from infection Outcome: Completed/Met Goal: Diagnostic test results will improve Outcome: Completed/Met Goal: Respiratory complications will improve Outcome: Completed/Met Goal: Cardiovascular complication will be avoided Outcome: Completed/Met   Problem: Activity: Goal: Risk for activity intolerance will decrease Outcome: Completed/Met

## 2022-04-14 NOTE — Progress Notes (Signed)
Inpatient Rehabilitation Admissions Coordinator   I have CIR bed to admit him to today. I met with patient and wife and they are in agreement. I will make the arrangements . Acute team and TOC made aware.  Danne Baxter, RN, MSN Rehab Admissions Coordinator [phone] 04/14/2022 10:36 AM

## 2022-04-14 NOTE — Progress Notes (Signed)
As per Discharge Summary per Dr Avon Gully updated resume Glucophage 500 mg daily as well as Amaryl 1 mg daily.  Ventolin and DuoNeb added as needed.  Will resume patient's Xalatan ophthalmic solution

## 2022-04-14 NOTE — Progress Notes (Signed)
Inpatient Rehabilitation Admission Medication Review by a Pharmacist  A complete drug regimen review was completed for this patient to identify any potential clinically significant medication issues.  High Risk Drug Classes Is patient taking? Indication by Medication  Antipsychotic No   Anticoagulant No   Antibiotic No   Opioid No   Antiplatelet Yes ASA, Plavix - TIA *DAPT x 3weeks, then Plavix alone starting 2/27  Hypoglycemics/insulin Yes Glimepiride, Novolog SSI, metformin - T2DM  Vasoactive Medication No   Chemotherapy No   Other Yes Donepezil - dementia Hydroxyzine- prn sleep Latanoprost - glaucoma Crestor - HLD     Type of Medication Issue Identified Description of Issue Recommendation(s)  Drug Interaction(s) (clinically significant)     Duplicate Therapy     Allergy     No Medication Administration End Date     Incorrect Dose     Additional Drug Therapy Needed     Significant med changes from prior encounter (inform family/care partners about these prior to discharge).    Other  PTA meds not yet resumed: losartan, metoprolol succinate, isosorbide mononitrate Restart PTA meds when and if necessary during CIR admission or at time of discharge, if warranted    Clinically significant medication issues were identified that warrant physician communication and completion of prescribed/recommended actions by midnight of the next day:  No  Name of provider notified for urgent issues identified:   Provider Method of Notification:   Pharmacist comments:   Time spent performing this drug regimen review (minutes):  [place], PharmD, BCPS 04/14/2022 8:40 PM

## 2022-04-14 NOTE — TOC CM/SW Note (Addendum)
SDOH Interventions Today    Flowsheet Row Most Recent Value  SDOH Interventions   Utilities Interventions [facility]      Resources provided to the pts spouse for utilities assistance.

## 2022-04-14 NOTE — Psychosocial Assessment (Signed)
Grant Staggers, MD  Physician Physical Medicine and Rehabilitation   PMR Pre-admission     Signed   Date of Service: 04/11/2022  4:37 PM  Related encounter: ED to Hosp-Admission (Current) from 04/09/2022 in [facility]   Signed      Show:Clear all [x]$ Written[x]$ Templated[x]$ Copied  Added by: [x]$ Cristina Gong, RN  []$ Hover for details PMR Admission Coordinator Pre-Admission Assessment   Patient: Grant Jensen. is an 89 y.o., male MRN: [id number] DOB: 31-Oct-1928 Height: 5' 2"$  (157.5 cm) Weight: 80.7 kg (Per wife)                                                                                                                                                  Insurance Information HMO: yes    PPO:      PCP:      IPA:      80/20:      OTHER:  PRIMARY: [place] [employer]      Policy#: [id number]      Subscriber: pt CM Name: Ward Chatters      Phone#: [phone] option #7     Fax#: [phone] Pre-Cert#: [id number] approved for 7 days     Employer: 2/6 Benefits:  Phone #: [phone]     Name: 2/6 Eff. Date: 04/06/22     Deduct: none      Out of Pocket Max: $3600      Life Max: none  CIR: $295 co pay per day days 1 until 5      SNF: no co pay per day days 1 until 20; $203 co pay per day days 21 until 100 Outpatient: $20 per visit     Co-Pay: visits per medical neccesity Home Health: 100%      Co-Pay: visits per medical neccesity DME: 80%     Co-Pay: 20% Providers: in network  SECONDARY: none   Financial Counselor:       Phone#:    The Engineer, petroleum" for patients in Inpatient Rehabilitation Facilities with attached "Privacy Act [place] Records" was provided and verbally reviewed with: Family   Emergency Contact Information Contact Information       Name Relation Home Work Mobile    [place] Spouse [phone]   [phone]         Current Medical History  Patient Admitting Diagnosis: CVA    History of Present Illness: 89 year old male with history of HTN, DM, dementia, nonischemic cardiomyopathy, glaucoma/blindness presented 04/09/22 with left sided weakness.   MRI revealed small foci of acute infarct in the right periventricular posterior frontal lobe/corona radiata. Neurology consulted and felt likely due to small vessel disease. CTA head and neck with one of the proximal M2 branches on the right is severely stenosed to nearly occluded. Moderate focal stenosis in the P2 segment  of the right PCA. Moderate stenosis of the origin of the left vertebral artery. Moderate spinal canal stenosis at the craniocervical junction due to ligamentum falvum hypertrophy and pannus. 2 D echo EF 60 to 65 % grade 1 diastolic dysfunction. EEG is banromal due to mild diffuse slowing indicative of global cerebral dysfunction. Epileptiform abnormalities were not seen.    Recommends ASA and Clopidogrel and then ASA alone. Allow permissive HTN and then normalize with home meds of losartan and metoprolol. LDL 90 and to add Crestor. Hgb A1c 10.1 on metformin, and SSI.   Complete NIHSS TOTAL: 5 Glasgow Coma Scale Score: 15   Patient's medical record from [facility] has been reviewed by the rehabilitation admission coordinator and physician.   Past Medical History      Past Medical History:  Diagnosis Date   Blindness     Dementia ([place])     Diabetes mellitus without complication ([place])     Glaucoma     Hypertension      Has the patient had major surgery during 100 days prior to admission? No   Family History  family history is not on file.     Current Medications    Current Facility-Administered Medications:    acetaminophen (TYLENOL) tablet 650 mg, 650 mg, Oral, Q4H PRN, 650 mg at 04/11/22 0634 **OR** acetaminophen (TYLENOL) 160 MG/5ML solution 650 mg, 650 mg, Per Tube, Q4H PRN **OR** acetaminophen (TYLENOL) suppository 650 mg, 650 mg, Rectal, Q4H PRN, Marcelyn Bruins, MD   aspirin EC  tablet 81 mg, 81 mg, Oral, Daily, Shafer, Devon, NP, 81 mg at 04/14/22 1000   clopidogrel (PLAVIX) tablet 75 mg, 75 mg, Oral, Daily, Shafer, Devon, NP, 75 mg at 04/14/22 1000   donepezil (ARICEPT) tablet 5 mg, 5 mg, Oral, QHS, Marcelyn Bruins, MD, 5 mg at 04/13/22 2132   hydrOXYzine (ATARAX) tablet 10 mg, 10 mg, Oral, QHS PRN, Marcelyn Bruins, MD, 10 mg at 04/11/22 2207   insulin aspart (novoLOG) injection 0-9 Units, 0-9 Units, Subcutaneous, TID WC, Marcelyn Bruins, MD, 2 Units at 04/13/22 1719   rosuvastatin (CRESTOR) tablet 20 mg, 20 mg, Oral, Daily, Shafer, Devon, NP, 20 mg at 04/14/22 1000   senna-docusate (Senokot-S) tablet 1 tablet, 1 tablet, Oral, QHS PRN, Marcelyn Bruins, MD   Patients Current Diet:  Diet Order                  Diet regular Room service appropriate? Yes; Fluid consistency: Thin  Diet effective now                       Precautions / Restrictions Precautions Precautions: Fall, Other (comment) Precaution Comments: blind Restrictions Weight Bearing Restrictions: No    Has the patient had 2 or more falls or a fall with injury in the past year?Yes   Prior Activity Level Limited Community (1-2x/wk): wife provides supervision / min assist with all ADLS and ambulation (Goes ot [facility] day care program 2 to 3 times per week, wife transports)   Prior Functional Level Prior Function Prior Level of Function : History of Falls (last six months), Needs assist Mobility Comments: Pt requires bil HHA or RW for mobility and guidance for all standing mobility due to his blindness. Able to ambulate household distances and navigate stairs with guidance. Hx of falls. ADLs Comments: Wife assists with all ADLs due to blindness and dementia. Pt able to assist in dressing.   Self Care:  Did the patient need help bathing, dressing, using the toilet or eating?  Dependent   Indoor Mobility: Did the patient need assistance with walking from room to room  (with or without device)? Dependent   Stairs: Did the patient need assistance with internal or external stairs (with or without device)? Dependent   Functional Cognition: Did the patient need help planning regular tasks such as shopping or remembering to take medications? Dependent   Patient Information Are you of Hispanic, Latino/a,or Spanish origin?: X. Patient unable to respond, A. No, not of Hispanic, Latino/a, or Spanish origin What is your race?: X. Patient unable to respond, B. Black or African American Do you need or want an interpreter to communicate with a doctor or health care staff?: 9. Unable to respond (no)   Patient's Response To:  Health Literacy and Transportation Is the patient able to respond to health literacy and transportation needs?: No Health Literacy - How often do you need to have someone help you when you read instructions, pamphlets, or other written material from your doctor or pharmacy?: Patient unable to respond (unable to respond; always) In the past 12 months, has lack of transportation kept you from medical appointments or from getting medications?: No In the past 12 months, has lack of transportation kept you from meetings, work, or from getting things needed for daily living?: No   Home Assistive Devices / [place] Devices/Equipment: Environmental consultant (specify type) Home Equipment: Conservation officer, nature (2 wheels), Transport chair, BSC/3in1, Shower seat, Grab bars - tub/shower, Hand held shower head   Prior Device Use: Indicate devices/aids used by the patient prior to current illness, exacerbation or injury? Walker   Current Functional Level Cognition   Arousal/Alertness: Awake/alert Overall Cognitive Status: History of cognitive impairments - at baseline Orientation Level: Oriented to person General Comments: Wife reports he seems at his baseline currently. Pt has dementia and asks questions frequently and fidgets with things often. Attention:  Focused Focused Attention: Appears intact Memory: Impaired Awareness: Impaired Awareness Impairment: Emergent impairment, Intellectual impairment Problem Solving: Impaired    Extremity Assessment (includes Sensation/Coordination)   Upper Extremity Assessment: Overall WFL for tasks assessed, Difficult to assess due to impaired cognition (Patient with no weakness in L side when assessed, able to follow commands to complete strength assessment, however will continue to assess)  Lower Extremity Assessment: Defer to PT evaluation LLE Deficits / Details: MMT scores of 4 hip flexion, 4+ knee extension; appeared to be hypersensitive to touch at the foot noted by pt withdrawing and often pulling foot up off the ground; possible deficits in sensation and proprioception noted by poor awareness of his foot location; incoordination noted LLE Coordination: decreased gross motor, decreased fine motor     ADLs   Overall ADL's : Needs assistance/impaired Eating/Feeding: Maximal assistance, Sitting Eating/Feeding Details (indicate cue type and reason): secondary to blindness Grooming: Wash/dry hands, Wash/dry face, Sitting, Minimal assistance Upper Body Bathing: Moderate assistance, Sitting Lower Body Bathing: Maximal assistance, Total assistance, Sitting/lateral leans, Sit to/from stand Upper Body Dressing : Moderate assistance, Sitting Lower Body Dressing: Maximal assistance, Total assistance, Sitting/lateral leans, Sit to/from stand, +2 for physical assistance Lower Body Dressing Details (indicate cue type and reason): changing brief out in standing, needing assist to thread and to doff and don, unable  to complete peri-care in standing Toilet Transfer: Moderate assistance, +2 for physical assistance, +2 for safety/equipment, Stand-pivot, Cueing for sequencing, Cueing for safety Toilet Transfer Details (indicate cue type and reason): simulated with sit<>stand transfers, cues  to advance feet but noted  increased ability to move L foot Toileting- Clothing Manipulation and Hygiene: Total assistance, +2 for physical assistance, +2 for safety/equipment, Sitting/lateral lean, Sit to/from stand Toileting - Clothing Manipulation Details (indicate cue type and reason): total A for peri care in standing Functional mobility during ADLs: Moderate assistance, +2 for physical assistance, +2 for safety/equipment, Cueing for safety, Cueing for sequencing General ADL Comments: Session focus on sit<>stands and functional mobility to increase overall activity tolerance     Mobility   Overal bed mobility: Needs Assistance Bed Mobility: Supine to Sit, Sit to Supine Supine to sit: Min guard Sit to supine: Min assist General bed mobility comments: Min A at the LE for sitting to supine. Min Verbal/tactile  cues for pt to get from supine to sitting     Transfers   Overall transfer level: Needs assistance Equipment used: Rolling walker (2 wheels) Transfers: Sit to/from Stand Sit to Stand: Min assist Bed to/from chair/wheelchair/BSC transfer type:: Stand pivot Stand pivot transfers: Mod assist, +2 physical assistance, +2 safety/equipment Squat pivot transfers: Mod assist, +2 physical assistance General transfer comment: Min A for hand placement with AD and verbal cues for body mechanics in order to decrease risk for falls.     Ambulation / Gait / Stairs / Wheelchair Mobility   Ambulation/Gait Ambulation/Gait assistance: Max Web designer (Feet): 30 Feet Assistive device: Rolling walker (2 wheels) Gait Pattern/deviations: Step-to pattern, Decreased step length - left, Decreased stance time - left, Decreased weight shift to left General Gait Details: Pt requires Max A for verbal cues and assistance navigating RW with cuing "walker, big step L, big step R" for each step for safe ambulation. Intermittenty pt would need cueing to take two steps with the L to surpass the R for a step to or partial step through  to prevent the LLE from trailing. Gait velocity: Decreased cadence. Gait velocity interpretation: <1.31 ft/sec, indicative of household ambulator Stairs:  (Unable at this time due to current functional status.)     Posture / Balance Dynamic Sitting Balance Sitting balance - Comments: No LOB while sitting. Balance Overall balance assessment: Needs assistance Sitting-balance support: Single extremity supported, Feet supported Sitting balance-Leahy Scale: Fair Sitting balance - Comments: No LOB while sitting. Postural control: Posterior lean, Left lateral lean Standing balance support: Bilateral upper extremity supported, Single extremity supported Standing balance-Leahy Scale: Fair Standing balance comment: Pt can stand without AD or UE support but with dynamic activities requires AD and constant cueing in order to maintain upright balance and prevent fall mostly due to poor kinesthetic awareness at the LE.     Special needs/care consideration Blind and dementia Fall precautions Hgb A1c 10.1    Previous Home Environment  Living Arrangements: Spouse/significant other  Lives With: Spouse (of 20 years) Available Help at Discharge: Family, Available 24 hours/day Type of Home: House Home Layout: One level Home Access: Stairs to enter Entrance Stairs-Rails: Right, Left Entrance Stairs-Number of Steps: 3 Bathroom Shower/Tub: Optometrist: Yes Home Care Services: No Additional Comments: goes to [facility] day care 2 to 3 times per week ; paid by DSS; wife transports   Discharge Living Setting Plans for Discharge Living Setting: Patient's home, Lives with (comment) (wife of 20 years) Type of Home at Discharge: House Discharge Home Layout: One level Discharge Home Access: Stairs to enter Entrance Stairs-Rails: Right, Left Entrance Stairs-Number of Steps: 3 Discharge Bathroom Shower/Tub: Tub/shower unit Discharge Bathroom  Toilet: Standard Discharge  Bathroom Accessibility: Yes How Accessible: Accessible via walker Does the patient have any problems obtaining your medications?: No   Social/Family/Support Systems Patient Roles: Spouse Contact Information: wife, [place] Anticipated Caregiver: Wife , Marketing executive Information: see contacts Ability/Limitations of Caregiver: wife is 31 years younger, they have been married 95 years 3/1 Caregiver Availability: 24/7 Discharge Plan Discussed with Primary Caregiver: Yes Is Caregiver In Agreement with Plan?: Yes Does Caregiver/Family have Issues with Lodging/Transportation while Pt is in Rehab?: No   Goals Patient/Family Goal for Rehab: min assist with PT and OT, min to max asisst with SLP Expected length of stay: ELOS 7 to 10 days Additional Information: Was at [facility] 05/04/20 Pt/Family Agrees to Admission and willing to participate: Yes Program Orientation Provided & Reviewed with Pt/Caregiver Including Roles  & Responsibilities: Yes   Decrease burden of Care through IP rehab admission: n/a   Possible need for SNF placement upon discharge:not anticipated   Patient Condition: This patient's medical and functional status has changed since the consult dated: 04/11/22 in which the Rehabilitation Physician determined and documented that the patient's condition is appropriate for intensive rehabilitative care in an inpatient rehabilitation facility. See "History of Present Illness" (above) for medical update. Functional changes are: overall mod assist. Patient's medical and functional status update has been discussed with the Rehabilitation physician and patient remains appropriate for inpatient rehabilitation. Will admit to inpatient rehab today.   Preadmission Screen Completed By:  Cleatrice Burke, RN, 04/14/2022 10:37 AM ______________________________________________________________________   Discussed status with Dr. Naaman Plummer  on 04/14/22 at 1038 and received approval for admission today.   Admission Coordinator:  Cleatrice Burke, time [id number] Date 04/14/22            Revision History

## 2022-04-14 NOTE — H&P (Signed)
Physical Medicine and Rehabilitation Admission H&P       HPI: Grant Jensen. Grant Jensen, Grant Jensen. is a 89 year old right-handed male with history significant for hypertension, CKD stage III, diabetes melitis with retinopathy/blindness since 1994, nonischemic cardiomyopathy maintained on low-dose aspirin, dementia maintained on Aricept.  Per chart review patient lives with spouse.  He does attend adult daycare 3 days a week.  1 level home 3 steps to entry.  Required bilateral hand-held assist versus rolling walker for mobility and guidance for all standing mobility due to his blindness.  Wife assist with ADLs.  Presented 04/09/2022 with acute onset of left-sided weakness and slurred speech.  Cranial CT scan negative.  CT angiogram head and neck one of the proximal M2 branches on the right severely stenosed to nearly occluded.  Moderate focal stenosis in the P2 segment of the right PCA.  MRI showed small foci of acute infarct in the right periventricular posterior frontal lobe/corona radiata.  No hemorrhage or mass effect.  EEG performed showed mild diffuse slowing no seizure.  Echocardiogram with ejection fraction of 60 to 65% no wall motion abnormalities grade 1 diastolic dysfunction.  Patient did not receive tPA.  Admission chemistries unremarkable except glucose 162, creatinine 1.64, hemoglobin A1c 10.1.  Neurology follow-up placed on low-dose aspirin as well as Plavix for CVA prophylaxis x 3 weeks then Plavix alone.  Tolerating a regular consistency diet.  Therapy evaluations completed due to patient decreased functional mobility and left-sided weakness was admitted for a comprehensive rehab program.   Review of Systems  Constitutional:  Negative for chills and fever.  HENT:  Negative for hearing loss.   Eyes:        Total blindness  Respiratory:  Negative for cough and shortness of breath.   Cardiovascular:  Positive for leg swelling. Negative for chest pain and palpitations.  Gastrointestinal:  Positive for  constipation. Negative for heartburn, nausea and vomiting.  Genitourinary:  Positive for urgency. Negative for dysuria, flank pain and hematuria.  Musculoskeletal:  Positive for falls, joint pain and myalgias.  Skin:  Negative for rash.  Neurological:  Positive for speech change and weakness.  Psychiatric/Behavioral:  Positive for memory loss.   All other systems reviewed and are negative.       Past Medical History:  Diagnosis Date   Blindness     Dementia ([place])     Diabetes mellitus without complication ([place])     Glaucoma     Hypertension           Past Surgical History:  Procedure Laterality Date   LEFT HEART CATHETERIZATION WITH CORONARY ANGIOGRAM N/A 06/22/2014    Procedure: LEFT HEART CATHETERIZATION WITH CORONARY ANGIOGRAM;  Surgeon: Adrian Prows, MD;  Location: [facility] CATH LAB;  Service: Cardiovascular;  Laterality: N/A;    History reviewed. No pertinent family history. Social History:  reports that he has never smoked. He does not have any smokeless tobacco history on file. He reports current alcohol use. He reports that he does not currently use drugs. Allergies:      Allergies  Allergen Reactions   Lisinopril Cough   Penicillin G Hives and Rash          Medications Prior to Admission  Medication Sig Dispense Refill   acetaminophen (TYLENOL) 325 MG tablet Take 2 tablets (650 mg total) by mouth every 6 (six) hours as needed for moderate pain.       albuterol (VENTOLIN HFA) 108 (90 Base) MCG/ACT inhaler Inhale 2 puffs into the lungs every  4 (four) hours as needed for wheezing or shortness of breath.       aspirin EC 81 MG tablet Take 81 mg by mouth daily.       donepezil (ARICEPT) 5 MG tablet Take 5 mg by mouth at bedtime.       glimepiride (AMARYL) 1 MG tablet Take 1 mg by mouth every morning.       hydrOXYzine (ATARAX) 25 MG tablet Take 1 at night as needed for sleep (Patient taking differently: Take 10 mg by mouth at bedtime as needed (sleep). Take 1 at night as needed for  sleep) 30 tablet 0   ipratropium-albuterol (DUONEB) 0.5-2.5 (3) MG/3ML SOLN Take 3 mLs by nebulization every 6 (six) hours as needed (SOB).       latanoprost (XALATAN) 0.005 % ophthalmic solution Place 1 drop into both eyes at bedtime.       losartan (COZAAR) 25 MG tablet Take 25 mg by mouth daily.       metFORMIN (GLUCOPHAGE) 500 MG tablet Take 1 tablet (500 mg total) by mouth daily with breakfast. (Patient taking differently: Take 500 mg by mouth 2 (two) times daily.)       metoprolol succinate (TOPROL-XL) 25 MG 24 hr tablet Take 25 mg by mouth daily.       omeprazole (PRILOSEC) 20 MG capsule Take 20 mg by mouth every other day.       isosorbide mononitrate (IMDUR) 30 MG 24 hr tablet Take 30 mg by mouth daily. (Patient not taking: Reported on 04/09/2022)       predniSONE (DELTASONE) 50 MG tablet Take 1 tablet (50 mg total) by mouth daily. (Patient not taking: Reported on 04/09/2022) 5 tablet 0          Home: Home Living Family/patient expects to be discharged to:: Private residence Living Arrangements: Spouse/significant other Available Help at Discharge: Family, Available 24 hours/day Type of Home: House Home Access: Stairs to enter [employer] of Steps: 3 Entrance Stairs-Rails: Right, Left Home Layout: One level Bathroom Shower/Tub: Chiropodist: Standard Bathroom Accessibility: Yes Home Equipment: Conservation officer, nature (2 wheels), Transport chair, BSC/3in1, Civil engineer, contracting, Grab bars - tub/shower, Hand held shower head Additional Comments: goes to [employer] day care 2 to 3 times per week ; paid by DSS; wife transports  Lives With: Spouse (of 20 years)   Functional History: Prior Function Prior Level of Function : History of Falls (last six months), Needs assist Mobility Comments: Pt requires bil HHA or RW for mobility and guidance for all standing mobility due to his blindness. Able to ambulate household distances and navigate stairs with guidance. Hx of  falls. ADLs Comments: Wife assists with all ADLs due to blindness and dementia. Pt able to assist in dressing.   Functional Status:  Mobility: Bed Mobility Overal bed mobility: Needs Assistance Bed Mobility: Supine to Sit, Sit to Supine Supine to sit: Min guard Sit to supine: Min assist General bed mobility comments: Min A at the LE for sitting to supine. Min Verbal/tactile  cues for pt to get from supine to sitting Transfers Overall transfer level: Needs assistance Equipment used: Rolling walker (2 wheels) Transfers: Sit to/from Stand Sit to Stand: Min assist Bed to/from chair/wheelchair/BSC transfer type:: Stand pivot Stand pivot transfers: Mod assist, +2 physical assistance, +2 safety/equipment Squat pivot transfers: Mod assist, +2 physical assistance General transfer comment: Min A for hand placement with AD and verbal cues for body mechanics in order to decrease risk for falls. Ambulation/Gait  Ambulation/Gait assistance: Max Web designer (Feet): 30 Feet Assistive device: Rolling walker (2 wheels) Gait Pattern/deviations: Step-to pattern, Decreased step length - left, Decreased stance time - left, Decreased weight shift to left General Gait Details: Pt requires Max A for verbal cues and assistance navigating RW with cuing "walker, big step L, big step R" for each step for safe ambulation. Intermittenty pt would need cueing to take two steps with the L to surpass the R for a step to or partial step through to prevent the LLE from trailing. Gait velocity: Decreased cadence. Gait velocity interpretation: <1.31 ft/sec, indicative of household ambulator Stairs:  (Unable at this time due to current functional status.)   ADL: ADL Overall ADL's : Needs assistance/impaired Eating/Feeding: Maximal assistance, Sitting Eating/Feeding Details (indicate cue type and reason): secondary to blindness Grooming: Wash/dry hands, Wash/dry face, Sitting, Minimal assistance Upper Body  Bathing: Moderate assistance, Sitting Lower Body Bathing: Maximal assistance, Total assistance, Sitting/lateral leans, Sit to/from stand Upper Body Dressing : Moderate assistance, Sitting Lower Body Dressing: Maximal assistance, Total assistance, Sitting/lateral leans, Sit to/from stand, +2 for physical assistance Lower Body Dressing Details (indicate cue type and reason): changing brief out in standing, needing assist to thread and to doff and don, unable  to complete peri-care in standing Toilet Transfer: Moderate assistance, +2 for physical assistance, +2 for safety/equipment, Stand-pivot, Cueing for sequencing, Cueing for safety Toilet Transfer Details (indicate cue type and reason): simulated with sit<>stand transfers, cues to advance feet but noted increased ability to move L foot Toileting- Clothing Manipulation and Hygiene: Total assistance, +2 for physical assistance, +2 for safety/equipment, Sitting/lateral lean, Sit to/from stand Toileting - Clothing Manipulation Details (indicate cue type and reason): total A for peri care in standing Functional mobility during ADLs: Moderate assistance, +2 for physical assistance, +2 for safety/equipment, Cueing for safety, Cueing for sequencing General ADL Comments: Session focus on sit<>stands and functional mobility to increase overall activity tolerance   Cognition: Cognition Overall Cognitive Status: History of cognitive impairments - at baseline Arousal/Alertness: Awake/alert Orientation Level: Oriented to person Attention: Focused Focused Attention: Appears intact Memory: Impaired Awareness: Impaired Awareness Impairment: Emergent impairment, Intellectual impairment Problem Solving: Impaired Cognition Arousal/Alertness: Awake/alert Behavior During Therapy: WFL for tasks assessed/performed Overall Cognitive Status: History of cognitive impairments - at baseline General Comments: Wife reports he seems at his baseline currently. Pt has  dementia and asks questions frequently and fidgets with things often.   Physical Exam: Blood pressure 134/69, pulse 67, temperature 98.2 F (36.8 C), temperature source Oral, resp. rate 20, height 5' 2"$  (1.575 m), weight 80.7 kg, SpO2 100 %. Physical Exam Constitutional:      General: He is not in acute distress. HENT:     Head: Atraumatic.     Right Ear: External ear normal.     Left Ear: External ear normal.     Nose: Nose normal.     Mouth/Throat:     Pharynx: Oropharynx is clear.  Eyes:     Comments: Unable to see even light/dark  Cardiovascular:     Rate and Rhythm: Normal rate and regular rhythm.     Heart sounds: No murmur heard.    No gallop.  Pulmonary:     Effort: Pulmonary effort is normal. No respiratory distress.     Breath sounds: Normal breath sounds. No wheezing.  Abdominal:     General: Bowel sounds are normal. There is no distension.     Palpations: Abdomen is soft.  Musculoskeletal:  General: No swelling or tenderness.     Cervical back: Normal range of motion.     Comments: No pain with ROM or palpation  Neurological:     Mental Status: He is alert.     Comments: Patient is alert sitting up in bed.  Follows simple commands.  Oriented to person only. Blind. Left facial droop. Speech slurred.  Moves right side preferentially over left. RUE 4/5. LUE 4-/5. RLE 4/5 LLE 3+ prox to 4/5 distally. Sensed pain in a 4's. Feet hypersensitive to touch. DTR's 1+. No abnl tone  Psychiatric:     Comments: Generally cooperative. Occasionally irritable today        Lab Results Last 48 Hours        Results for orders placed or performed during the hospital encounter of 04/09/22 (from the past 48 hour(s))  Glucose, capillary     Status: Abnormal    Collection Time: 04/12/22  6:00 AM  Result Value Ref Range    Glucose-Capillary 102 (H) 70 - 99 mg/dL      Comment: Glucose reference range applies only to samples taken after fasting for at least 8 hours.  Glucose,  capillary     Status: Abnormal    Collection Time: 04/12/22 11:47 AM  Result Value Ref Range    Glucose-Capillary 207 (H) 70 - 99 mg/dL      Comment: Glucose reference range applies only to samples taken after fasting for at least 8 hours.  Glucose, capillary     Status: Abnormal    Collection Time: 04/12/22  4:19 PM  Result Value Ref Range    Glucose-Capillary 152 (H) 70 - 99 mg/dL      Comment: Glucose reference range applies only to samples taken after fasting for at least 8 hours.  Glucose, capillary     Status: Abnormal    Collection Time: 04/12/22  9:06 PM  Result Value Ref Range    Glucose-Capillary 149 (H) 70 - 99 mg/dL      Comment: Glucose reference range applies only to samples taken after fasting for at least 8 hours.  Comprehensive metabolic panel     Status: Abnormal    Collection Time: 04/13/22  3:19 AM  Result Value Ref Range    Sodium 136 135 - 145 mmol/L    Potassium 4.2 3.5 - 5.1 mmol/L    Chloride 103 98 - 111 mmol/L    CO2 23 22 - 32 mmol/L    Glucose, Bld 125 (H) 70 - 99 mg/dL      Comment: Glucose reference range applies only to samples taken after fasting for at least 8 hours.    BUN 17 8 - 23 mg/dL    Creatinine, Ser 1.69 (H) 0.61 - 1.24 mg/dL    Calcium 9.0 8.9 - 10.3 mg/dL    Total Protein 6.1 (L) 6.5 - 8.1 g/dL    Albumin 3.5 3.5 - 5.0 g/dL    AST 28 15 - 41 U/L    ALT 17 0 - 44 U/L    Alkaline Phosphatase 52 38 - 126 U/L    Total Bilirubin 0.5 0.3 - 1.2 mg/dL    GFR, Estimated 38 (L) >60 mL/min      Comment: (NOTE) Calculated using the CKD-EPI Creatinine Equation (2021)      Anion gap 10 5 - 15      Comment: Performed at [facility] [address], [place], [place] [address]  Magnesium     Status:  None    Collection Time: 04/13/22  3:19 AM  Result Value Ref Range    Magnesium 1.9 1.7 - 2.4 mg/dL      Comment: Performed at [facility], [place] [address] [place], [place] [address]  Glucose, capillary     Status: Abnormal     Collection Time: 04/13/22  6:14 AM  Result Value Ref Range    Glucose-Capillary 108 (H) 70 - 99 mg/dL      Comment: Glucose reference range applies only to samples taken after fasting for at least 8 hours.  Glucose, capillary     Status: Abnormal    Collection Time: 04/13/22 11:54 AM  Result Value Ref Range    Glucose-Capillary 135 (H) 70 - 99 mg/dL      Comment: Glucose reference range applies only to samples taken after fasting for at least 8 hours.    Comment 1 Notify RN      Comment 2 Document in Chart    Glucose, capillary     Status: Abnormal    Collection Time: 04/13/22  5:03 PM  Result Value Ref Range    Glucose-Capillary 184 (H) 70 - 99 mg/dL      Comment: Glucose reference range applies only to samples taken after fasting for at least 8 hours.    Comment 1 Notify RN      Comment 2 Document in Chart    Glucose, capillary     Status: Abnormal    Collection Time: 04/13/22 10:02 PM  Result Value Ref Range    Glucose-Capillary 206 (H) 70 - 99 mg/dL      Comment: Glucose reference range applies only to samples taken after fasting for at least 8 hours.  CBC     Status: Abnormal    Collection Time: 04/14/22  4:42 AM  Result Value Ref Range    WBC 7.6 4.0 - 10.5 K/uL    RBC 4.97 4.22 - 5.81 MIL/uL    Hemoglobin 12.2 (L) 13.0 - 17.0 g/dL    HCT 39.0 39.0 - 52.0 %    MCV 78.5 (L) 80.0 - 100.0 fL    MCH 24.5 (L) 26.0 - 34.0 pg    MCHC 31.3 30.0 - 36.0 g/dL    RDW 14.3 11.5 - 15.5 %    Platelets 180 150 - 400 K/uL    nRBC 0.0 0.0 - 0.2 %      Comment: Performed at [facility], [place] [address] [place], [place] [address]      Imaging Results (Last 48 hours)  No results found.         Blood pressure 134/69, pulse 67, temperature 98.2 F (36.8 C), temperature source Oral, resp. rate 20, height 5' 2"$  (1.575 m), weight 80.7 kg, SpO2 100 %.   Medical Problem List and Plan: 1. Functional deficits secondary to right periventricular posterior frontal lobe infarct due to  small vessel disease             -patient may shower             -ELOS/Goals: 7-10 days, min assist goals with PT, OT. Min to max with SLP. Pt needs to be more able to assist with care so that his wife can bring him home. 2.  Antithrombotics: -DVT/anticoagulation:  Mechanical: Antiembolism stockings, thigh (TED hose) Bilateral lower extremities             -antiplatelet therapy: Aspirin 81 mg daily and Plavix 75 mg daily x  3 weeks then Plavix alone 3. Pain Management: Tylenol as needed 4. Mood/Behavior/Sleep: Aricept 5 mg nightly, Atarax 10 mg nightly as needed sleep             -antipsychotic agents: N/A             -keep sleep chart 5. Neuropsych/cognition: This patient is not capable of making decisions on his own behalf. 6. Skin/Wound Care: Routine skin checks 7. Fluids/Electrolytes/Nutrition: Routine in and outs with follow-up chemistries.             -po intake has generally been steady 8.  Hyperlipidemia.  Crestor 9.  Diabetes mellitus.  Hemoglobin A1c 10.1.  Currently on SSI.  Patient on Amaryl 1 mg daily, Glucophage 500 mg twice daily prior to admission.  Resume as needed             -control inconsistent so far. Follow for pattern 10.  Permissive hypertension.  Currently on no antihypertensive medication.  Patient on Cozaar 25 mg daily, Toprol-XL 25 mg daily and Imdur 30 mg daily prior to admission.  Resume as needed 11.  CKD stage III.  Follow-up chemistries on Monday 12.  Total blindness.  Resume eyedrops.     Lavon Paganini Angiulli, PA-C 04/14/2022  I have personally performed a face to face diagnostic evaluation of this patient and formulated the key components of the plan.  Additionally, I have personally reviewed laboratory data, imaging studies, as well as relevant notes and concur with the physician assistant's documentation above.  The patient's status has not changed from the original H&P.  Any changes in documentation from the acute care chart have been noted  above.  [name], MD, Mellody Drown

## 2022-04-15 ENCOUNTER — Encounter (HOSPITAL_COMMUNITY): Payer: Self-pay | Admitting: Physical Medicine and Rehabilitation

## 2022-04-15 DIAGNOSIS — N183 Chronic kidney disease, stage 3 unspecified: Secondary | ICD-10-CM

## 2022-04-15 DIAGNOSIS — E785 Hyperlipidemia, unspecified: Secondary | ICD-10-CM

## 2022-04-15 LAB — GLUCOSE, CAPILLARY
Glucose-Capillary: 107 mg/dL — ABNORMAL HIGH (ref 70–99)
Glucose-Capillary: 149 mg/dL — ABNORMAL HIGH (ref 70–99)
Glucose-Capillary: 113 mg/dL — ABNORMAL HIGH (ref 70–99)
Glucose-Capillary: 140 mg/dL — ABNORMAL HIGH (ref 70–99)

## 2022-04-15 NOTE — Plan of Care (Signed)
  Problem: RH Balance Goal: LTG Patient will maintain dynamic sitting balance (PT) Description: LTG:  Patient will maintain dynamic sitting balance with assistance during mobility activities (PT) Flowsheets (Taken 04/15/2022 1803) LTG: Pt will maintain dynamic sitting balance during mobility activities with:: Supervision/Verbal cueing Goal: LTG Patient will maintain dynamic standing balance (PT) Description: LTG:  Patient will maintain dynamic standing balance with assistance during mobility activities (PT) Flowsheets (Taken 04/15/2022 1803) LTG: Pt will maintain dynamic standing balance during mobility activities with:: Supervision/Verbal cueing   Problem: Sit to Stand Goal: LTG:  Patient will perform sit to stand with assistance level (PT) Description: LTG:  Patient will perform sit to stand with assistance level (PT) Flowsheets (Taken 04/15/2022 1803) LTG: PT will perform sit to stand in preparation for functional mobility with assistance level: Contact Guard/Touching assist   Problem: RH Bed Mobility Goal: LTG Patient will perform bed mobility with assist (PT) Description: LTG: Patient will perform bed mobility with assistance, with/without cues (PT). Flowsheets (Taken 04/15/2022 1803) LTG: Pt will perform bed mobility with assistance level of: Contact Guard/Touching assist   Problem: RH Bed to Chair Transfers Goal: LTG Patient will perform bed/chair transfers w/assist (PT) Description: LTG: Patient will perform bed to chair transfers with assistance (PT). Flowsheets (Taken 04/15/2022 1803) LTG: Pt will perform Bed to Chair Transfers with assistance level: Minimal Assistance - Patient > 75%   Problem: RH Car Transfers Goal: LTG Patient will perform car transfers with assist (PT) Description: LTG: Patient will perform car transfers with assistance (PT). Flowsheets (Taken 04/15/2022 1803) LTG: Pt will perform car transfers with assist:: Minimal Assistance - Patient > 75%   Problem: RH  Ambulation Goal: LTG Patient will ambulate in controlled environment (PT) Description: LTG: Patient will ambulate in a controlled environment, # of feet with assistance (PT). Flowsheets (Taken 04/15/2022 1803) LTG: Pt will ambulate in controlled environ  assist needed:: Minimal Assistance - Patient > 75% LTG: Ambulation distance in controlled environment: 35ft with LRAD Goal: LTG Patient will ambulate in home environment (PT) Description: LTG: Patient will ambulate in home environment, # of feet with assistance (PT). Flowsheets (Taken 04/15/2022 1803) LTG: Pt will ambulate in home environ  assist needed:: Minimal Assistance - Patient > 75% LTG: Ambulation distance in home environment: 30ft with LRAD   Problem: RH Stairs Goal: LTG Patient will ambulate up and down stairs w/assist (PT) Description: LTG: Patient will ambulate up and down # of stairs with assistance (PT) Flowsheets (Taken 04/15/2022 1803) LTG: Pt will  ambulate up and down number of stairs: 3 steps to access home

## 2022-04-15 NOTE — Plan of Care (Signed)
  Problem: RH Balance Goal: LTG: Patient will maintain dynamic sitting balance (OT) Description: LTG:  Patient will maintain dynamic sitting balance with assistance during activities of daily living (OT) Flowsheets (Taken 04/15/2022 1217) LTG: Pt will maintain dynamic sitting balance during ADLs with: Supervision/Verbal cueing Goal: LTG Patient will maintain dynamic standing with ADLs (OT) Description: LTG:  Patient will maintain dynamic standing balance with assist during activities of daily living (OT)  Flowsheets (Taken 04/15/2022 1217) LTG: Pt will maintain dynamic standing balance during ADLs with: Supervision/Verbal cueing   Problem: Sit to Stand Goal: LTG:  Patient will perform sit to stand in prep for activites of daily living with assistance level (OT) Description: LTG:  Patient will perform sit to stand in prep for activites of daily living with assistance level (OT) Flowsheets (Taken 04/15/2022 1217) LTG: PT will perform sit to stand in prep for activites of daily living with assistance level: Contact Guard/Touching assist   Problem: RH Eating Goal: LTG Patient will perform eating w/assist, cues/equip (OT) Description: LTG: Patient will perform eating with assist, with/without cues using equipment (OT) Flowsheets (Taken 04/15/2022 1217) LTG: Pt will perform eating with assistance level of: Minimal Assistance - Patient > 75%   Problem: RH Grooming Goal: LTG Patient will perform grooming w/assist,cues/equip (OT) Description: LTG: Patient will perform grooming with assist, with/without cues using equipment (OT) Flowsheets (Taken 04/15/2022 1217) LTG: Pt will perform grooming with assistance level of: Minimal Assistance - Patient > 75%   Problem: RH Bathing Goal: LTG Patient will bathe all body parts with assist levels (OT) Description: LTG: Patient will bathe all body parts with assist levels (OT) Flowsheets (Taken 04/15/2022 1217) LTG: Pt will perform bathing with assistance  level/cueing: Minimal Assistance - Patient > 75%   Problem: RH Dressing Goal: LTG Patient will perform upper body dressing (OT) Description: LTG Patient will perform upper body dressing with assist, with/without cues (OT). Flowsheets (Taken 04/15/2022 1217) LTG: Pt will perform upper body dressing with assistance level of: Supervision/Verbal cueing Goal: LTG Patient will perform lower body dressing w/assist (OT) Description: LTG: Patient will perform lower body dressing with assist, with/without cues in positioning using equipment (OT) Flowsheets (Taken 04/15/2022 1217) LTG: Pt will perform lower body dressing with assistance level of: Contact Guard/Touching assist   Problem: RH Toileting Goal: LTG Patient will perform toileting task (3/3 steps) with assistance level (OT) Description: LTG: Patient will perform toileting task (3/3 steps) with assistance level (OT)  Flowsheets (Taken 04/15/2022 1217) LTG: Pt will perform toileting task (3/3 steps) with assistance level: Minimal Assistance - Patient > 75%   Problem: RH Toilet Transfers Goal: LTG Patient will perform toilet transfers w/assist (OT) Description: LTG: Patient will perform toilet transfers with assist, with/without cues using equipment (OT) Flowsheets (Taken 04/15/2022 1217) LTG: Pt will perform toilet transfers with assistance level of: Contact Guard/Touching assist   Problem: RH Tub/Shower Transfers Goal: LTG Patient will perform tub/shower transfers w/assist (OT) Description: LTG: Patient will perform tub/shower transfers with assist, with/without cues using equipment (OT) Flowsheets (Taken 04/15/2022 1217) LTG: Pt will perform tub/shower stall transfers with assistance level of: Contact Guard/Touching assist

## 2022-04-15 NOTE — Progress Notes (Signed)
PROGRESS NOTE   Subjective/Complaints: Lying in bed this morning working with therapy on dressing.  No new concerns elicited  Review of Systems  Constitutional:  Negative for chills and fever.  Eyes:        Total blindness  Respiratory:  Negative for shortness of breath.   Cardiovascular:  Negative for chest pain.  Gastrointestinal:  Negative for abdominal pain.  Musculoskeletal:  Positive for joint pain.  Neurological:  Positive for speech change and weakness.    Objective:   No results found. Recent Labs    04/14/22 0442  WBC 7.6  HGB 12.2*  HCT 39.0  PLT 180   Recent Labs    04/13/22 0319 04/14/22 0616  NA 136 138  K 4.2 4.4  CL 103 102  CO2 23 22  GLUCOSE 125* 110*  BUN 17 16  CREATININE 1.69* 1.48*  CALCIUM 9.0 9.2    Intake/Output Summary (Last 24 hours) at 04/15/2022 0956 Last data filed at 04/15/2022 0800 Gross per 24 hour  Intake 417 ml  Output --  Net 417 ml        Physical Exam: Vital Signs Blood pressure (!) 135/59, pulse 63, temperature 98.7 F (37.1 C), temperature source Oral, resp. rate 15, height 5' 5"$  (1.651 m), weight 78.5 kg, SpO2 100 %.   General: Alert and oriented x 3, No apparent distress HEENT: Head is normocephalic, atraumatic, MMM Neck: Supple without JVD or lymphadenopathy Heart: Reg rate and rhythm. No murmurs rubs or gallops Chest: CTA bilaterally without wheezes, rales, or rhonchi; no distress Abdomen: Soft, non-tender, non-distended, bowel sounds positive. Extremities: No clubbing, cyanosis, or edema. Pulses are 2+ Skin: warm and dry Musculoskeletal:        General: No swelling or tenderness.     Cervical back: Normal range of motion.     Comments: No pain with ROM or palpation  Neurological:     Mental Status: He is alert.     Comments: Patient alert and oriented to self only, sitting up in bed.  Follows simple commands.   Blind both eyes. Left facial droop.  Speech slurred.  Moves right side preferentially over left. RUE 4/5. LUE 4-/5. RLE 4/5 LLE 3+ prox to 4/5 distally.  Feet hypersensitive to touch. No abnl tone  Psychiatric:     Comments: cooperative and pleasant   Assessment/Plan: 1. Functional deficits which require 3+ hours per day of interdisciplinary therapy in a comprehensive inpatient rehab setting. Physiatrist is providing close team supervision and 24 hour management of active medical problems listed below. Physiatrist and rehab team continue to assess barriers to discharge/monitor patient progress toward functional and medical goals  Care Tool:  Bathing    Body parts bathed by patient: Right arm, Left arm, Chest, Abdomen, Front perineal area, Right upper leg, Left upper leg, Face   Body parts bathed by helper: Right lower leg, Left lower leg, Buttocks     Bathing assist Assist Level: Maximal Assistance - Patient 24 - 49%     Upper Body Dressing/Undressing Upper body dressing   What is the patient wearing?: Pull over shirt    Upper body assist Assist Level: Moderate Assistance - Patient 50 -  74%    Lower Body Dressing/Undressing Lower body dressing      What is the patient wearing?: Incontinence brief, Pants     Lower body assist Assist for lower body dressing: Moderate Assistance - Patient 50 - 74%     Toileting Toileting    Toileting assist Assist for toileting: 2 Helpers     Transfers Chair/bed transfer  Transfers assist           Locomotion Ambulation   Ambulation assist              Walk 10 feet activity   Assist           Walk 50 feet activity   Assist           Walk 150 feet activity   Assist           Walk 10 feet on uneven surface  activity   Assist           Wheelchair     Assist               Wheelchair 50 feet with 2 turns activity    Assist            Wheelchair 150 feet activity     Assist          Blood  pressure (!) 135/59, pulse 63, temperature 98.7 F (37.1 C), temperature source Oral, resp. rate 15, height 5' 5"$  (1.651 m), weight 78.5 kg, SpO2 100 %.  Medical Problem List and Plan: 1. Functional deficits secondary to right periventricular posterior frontal lobe infarct due to small vessel disease             -patient may shower             -ELOS/Goals: 7-10 days, min assist goals with PT, OT. Min to max with SLP. Pt needs to be more able to assist with care so that his wife can bring him home.  -CIR continue PT OT SLP 2.  Antithrombotics: -DVT/anticoagulation:  Mechanical: Antiembolism stockings, thigh (TED hose) Bilateral lower extremities             -antiplatelet therapy: Aspirin 81 mg daily and Plavix 75 mg daily x 3 weeks then Plavix alone 3. Pain Management: Tylenol as needed 4. Mood/Behavior/Sleep: Aricept 5 mg nightly, Atarax 10 mg nightly as needed sleep             -antipsychotic agents: N/A             -keep sleep chart 5. Neuropsych/cognition: This patient is not capable of making decisions on his own behalf. 6. Skin/Wound Care: Routine skin checks 7. Fluids/Electrolytes/Nutrition: Routine in and outs with follow-up chemistries.             -po intake has generally been steady 8.  Hyperlipidemia.  Continue Crestor 20 mg daily 9.  Diabetes mellitus.  Hemoglobin A1c 10.1.  Currently on SSI.  Patient on Amaryl 1 mg daily, Glucophage 500 mg twice daily prior to admission.  Resume as needed             -2/10 CBGs appear well-controlled, continue current medications 10.  Permissive hypertension.  Currently on no antihypertensive medication.  Patient on Cozaar 25 mg daily, Toprol-XL 25 mg daily and Imdur 30 mg daily prior to admission.  Resume as needed  -2/10 controlled overall, continue to monitor 11.  CKD stage III.  Last creatinine 1.48 on 2/9 follow-up chemistries on Monday 12.  Total blindness.  Resume eyedrops.      LOS: 1 days A FACE TO FACE EVALUATION WAS  PERFORMED  Jennye Boroughs 04/15/2022, 9:56 AM   `pmr

## 2022-04-15 NOTE — Progress Notes (Signed)
Inpatient Rehabilitation  Patient information reviewed and entered into eRehab system by Lexxi Koslow Joselynn Amoroso, OTR/L, Rehab Quality Coordinator.   Information including medical coding, functional ability and quality indicators will be reviewed and updated through discharge.   

## 2022-04-15 NOTE — Evaluation (Signed)
Physical Therapy Assessment and Plan  Patient Details  Name: Grant Jensen. MRN: [id number] Date of Birth: Jun 16, 1928  PT Diagnosis: Abnormal posture, Abnormality of gait, Coordination disorder, and Hemiplegia non-dominant Rehab Potential: Good ELOS: 5-7 days   Today's Date: 04/15/2022 PT Individual Time: 1020-1115 PT Individual Time Calculation (min): 55 min    Hospital Problem: Principal Problem:   Small vessel cerebrovascular accident (CVA) [facility])   Past Medical History:  Past Medical History:  Diagnosis Date   Blindness    Dementia ([place])    Diabetes mellitus without complication ([place])    Glaucoma    Hypertension    Past Surgical History:  Past Surgical History:  Procedure Laterality Date   LEFT HEART CATHETERIZATION WITH CORONARY ANGIOGRAM N/A 06/22/2014   Procedure: LEFT HEART CATHETERIZATION WITH CORONARY ANGIOGRAM;  Surgeon: Adrian Prows, MD;  Location: [facility] CATH LAB;  Service: Cardiovascular;  Laterality: N/A;    Assessment & Plan Clinical Impression: Patient is a 89 year old right-handed male with history significant for hypertension, CKD stage III, diabetes melitis with retinopathy/blindness since 1994, nonischemic cardiomyopathy maintained on low-dose aspirin, dementia maintained on Aricept.  Per chart review patient lives with spouse.  He does attend adult daycare 3 days a week.  1 level home 3 steps to entry.  Required bilateral hand-held assist versus rolling walker for mobility and guidance for all standing mobility due to his blindness.  Wife assist with ADLs.  Presented 04/09/2022 with acute onset of left-sided weakness and slurred speech.  Cranial CT scan negative.  CT angiogram head and neck one of the proximal M2 branches on the right severely stenosed to nearly occluded.  Moderate focal stenosis in the P2 segment of the right PCA.  MRI showed small foci of acute infarct in the right periventricular posterior frontal lobe/corona radiata.  No hemorrhage or mass effect.   EEG performed showed mild diffuse slowing no seizure.  Echocardiogram with ejection fraction of 60 to 65% no wall motion abnormalities grade 1 diastolic dysfunction.  Patient did not receive tPA.  Admission chemistries unremarkable except glucose 162, creatinine 1.64, hemoglobin A1c 10.1.  Neurology follow-up placed on low-dose aspirin as well as Plavix for CVA prophylaxis x 3 weeks then Plavix alone.  Tolerating a regular consistency diet.  Therapy evaluations completed due to patient decreased functional mobility and left-sided weakness was admitted for a comprehensive rehab program.  Patient transferred to CIR on 04/14/2022 .   Patient currently requires mod with mobility secondary to muscle weakness and muscle joint tightness, decreased cardiorespiratoy endurance, decreased coordination, decreased visual acuity, and decreased standing balance, hemiplegia, and decreased balance strategies.  Prior to hospitalization, patient was min with mobility and lived with Spouse (wife "[name]") in a House home.  Home access is 3Stairs to enter.  Patient will benefit from skilled PT intervention to maximize safe functional mobility, minimize fall risk, and decrease caregiver burden for planned discharge home with 24 hour assist.  Anticipate patient will benefit from follow up [facility] at discharge.  PT - End of Session Activity Tolerance: Tolerates 10 - 20 min activity with multiple rests Endurance Deficit: Yes Endurance Deficit Description: Mild SOB with functional transfers and increased activity PT Assessment Rehab Potential (ACUTE/IP ONLY): Good PT Barriers to Discharge: [place] home environment;Home environment access/layout;Insurance for SNF coverage PT Patient demonstrates impairments in the following area(s): Balance;Behavior;Endurance;Motor;Perception;Safety PT Transfers Functional Problem(s): Bed Mobility PT Locomotion Functional Problem(s): Ambulation;Wheelchair Mobility PT Plan PT Intensity: Minimum  of 1-2 x/day ,45 to 90 minutes PT Frequency: 5 out  of 7 days PT Duration Estimated Length of Stay: 5-7 days PT Treatment/Interventions: Ambulation/gait training;Balance/vestibular training;Cognitive remediation/compensation;Disease management/prevention;Discharge planning;Community reintegration;DME/adaptive equipment instruction;Functional electrical stimulation;Functional mobility training;Patient/family education;Pain management;Neuromuscular re-education;Psychosocial support;Skin care/wound management;Splinting/orthotics;Therapeutic Activities;UE/LE Strength taining/ROM;UE/LE Coordination activities;Stair training;Wheelchair propulsion/positioning;Therapeutic Exercise;Visual/perceptual remediation/compensation PT Transfers Anticipated Outcome(s): Min assist with LRAD PT Locomotion Anticipated Outcome(s): min assist with LRAD PT Recommendation Follow Up Recommendations: Home health PT Patient destination: Home Equipment Recommended: Wheelchair cushion (measurements);Wheelchair (measurements) Equipment Details: pt has RW   PT Evaluation Precautions/Restrictions   General PT Amount of Missed Time (min): 20 Minutes PT Missed Treatment Reason: Other (Comment) (eating lunch) Vital SignsTherapy Vitals Temp: 97.9 F (36.6 C) Pulse Rate: 70 Resp: 17 BP: 139/71 Patient Position (if appropriate): Sitting Oxygen Therapy SpO2: 100 % O2 Device: Room Air Pain Pain Assessment Pain Scale: 0-10 Pain Score: 0-No pain Pain Interference Pain Interference Pain Effect on Sleep: 0. Does not apply - I have not had any pain or hurting in the past 5 days Pain Interference with Therapy Activities: 1. Rarely or not at all Pain Interference with Day-to-Day Activities: 1. Rarely or not at all Home Living/Prior [place] Available Help at Discharge: Family;Available 24 hours/day (wife) Type of Home: House Home Access: Stairs to enter [employer] of Steps: 3 Entrance  Stairs-Rails: Can reach both;Right;Left Home Layout: One level Bathroom Shower/Tub: Product/process development scientist: Standard Bathroom Accessibility: Yes Additional Comments: goes to [employer] day care 3-5 times per week ; paid by DSS; wife transports  Lives With: Spouse (wife "[name]") Prior Function Level of Independence: Needs assistance with ADLs;Requires assistive device for independence;Needs assistance with gait;Needs assistance with tranfers Bath: Maximal Toileting: Minimal Grooming: Total Feeding: Total  Able to Take Stairs?: Yes Driving: No Vision/Perception  Vision - History Ability to See in Adequate Light: 4 Severely impaired Perception Perception: Impaired Praxis Praxis: Impaired Praxis Impairment Details: Perseveration;Initiation  Cognition Overall Cognitive Status: History of cognitive impairments - at baseline Arousal/Alertness: Awake/alert Memory: Impaired Awareness: Impaired Problem Solving: Impaired Safety/Judgment: Impaired Sensation Sensation Light Touch: Appears Intact Hot/Cold: Not tested Proprioception: Appears Intact Stereognosis: Not tested Coordination Gross Motor Movements are Fluid and Coordinated: No Fine Motor Movements are Fluid and Coordinated: No Coordination and Movement Description: generalized incoordination due to blindness, perception and balance deficits Finger Nose Finger Test: unable to formally test due to blindness Heel Shin Test: unable to follow instructions due to dementia and blindness Motor  Motor Motor: Hemiplegia Motor - Skilled Clinical Observations: mild L hemiplegia   Trunk/Postural Assessment  Cervical Assessment Cervical Assessment: Exceptions to [facility] (forward head) Thoracic Assessment Thoracic Assessment: Exceptions to [facility] (thoracic rounding) Lumbar Assessment Lumbar Assessment: Exceptions to [facility] (posterior pelvic tilt in sitting) Postural Control Postural Control: Deficits on evaluation   Balance Balance Balance Assessed: Yes Static Sitting Balance Static Sitting - Balance Support: Feet supported;No upper extremity supported Static Sitting - Level of Assistance: 5: Stand by assistance (CGA) Dynamic Sitting Balance Dynamic Sitting - Balance Support: Feet supported;No upper extremity supported Dynamic Sitting - Level of Assistance: 5: Stand by assistance (CGA) Static Standing Balance Static Standing - Balance Support: During functional activity;No upper extremity supported Static Standing - Level of Assistance: 4: Min assist Dynamic Standing Balance Dynamic Standing - Balance Support: During functional activity;No upper extremity supported Dynamic Standing - Level of Assistance: 4: Min assist Extremity Assessment      RLE Assessment RLE Assessment: Within Functional Limits General Strength Comments: grossly 4+/5 LLE Assessment LLE Assessment: Exceptions to [facility] General Strength Comments: grossly 4/5  Care  Tool Care Tool Bed Mobility Roll left and right activity   Roll left and right assist level: Minimal Assistance - Patient > 75%    Sit to lying activity   Sit to lying assist level: Minimal Assistance - Patient > 75%    Lying to sitting on side of bed activity   Lying to sitting on side of bed assist level: the ability to move from lying on the back to sitting on the side of the bed with no back support.: Minimal Assistance - Patient > 75%     Care Tool Transfers Sit to stand transfer   Sit to stand assist level: Minimal Assistance - Patient > 75%    Chair/bed transfer   Chair/bed transfer assist level: Moderate Assistance - Patient 50 - 74%     Physiological scientist transfer assist level: Moderate Assistance - Patient 50 - 74%      Care Tool Locomotion Ambulation   Assist level: Moderate Assistance - Patient 50 - 74% Assistive device: Walker-rolling Max distance: 30  Walk 10 feet activity   Assist level: Moderate Assistance  - Patient - 50 - 74% Assistive device: Walker-rolling   Walk 50 feet with 2 turns activity Walk 50 feet with 2 turns activity did not occur: Safety/medical concerns      Walk 150 feet activity Walk 150 feet activity did not occur: Safety/medical concerns      Walk 10 feet on uneven surfaces activity Walk 10 feet on uneven surfaces activity did not occur: Safety/medical concerns      Stairs   Assist level: Moderate Assistance - Patient - 50 - 74% Stairs assistive device: 2 hand rails Max number of stairs: 4  Walk up/down 1 step activity   Walk up/down 1 step (curb) assist level: Moderate Assistance - Patient - 50 - 74% Walk up/down 1 step or curb assistive device: 2 hand rails  Walk up/down 4 steps activity   Walk up/down 4 steps assist level: Moderate Assistance - Patient - 50 - 74% Walk up/down 4 steps assistive device: 2 hand rails  Walk up/down 12 steps activity Walk up/down 12 steps activity did not occur: Safety/medical concerns      Pick up small objects from floor Pick up small object from the floor (from standing position) activity did not occur: Safety/medical concerns (pt legally blind)      Wheelchair Is the patient using a wheelchair?: Yes Type of Wheelchair: Manual   Wheelchair assist level: Moderate Assistance - Patient 50 - 74% Max wheelchair distance: 50  Wheel 50 feet with 2 turns activity   Assist Level: Moderate Assistance - Patient 50 - 74%  Wheel 150 feet activity   Assist Level: Maximal Assistance - Patient 25 - 49%    Refer to Care Plan for Long Term Goals  SHORT TERM GOAL WEEK 1 PT Short Term Goal 1 (Week 1): STG=LTG due to ELOS  Recommendations for other services: None   Skilled Therapeutic Intervention Mobility Bed Mobility Bed Mobility: Rolling Right;Rolling Left;Supine to Sit Rolling Right: Minimal Assistance - Patient > 75% Rolling Left: Minimal Assistance - Patient > 75% Supine to Sit: Moderate Assistance - Patient  50-74% Transfers Transfers: Sit to Stand;Stand Pivot Transfers Sit to Stand: Minimal Assistance - Patient > 75% Stand Pivot Transfers: Moderate Assistance - Patient 50 - 74% Stand Pivot Transfer Details: Verbal cues for sequencing;Tactile cues for sequencing;Verbal cues for precautions/safety;Verbal cues for safe use of  DME/AE Transfer (Assistive device): Manufacturing systems engineer Ambulation: Yes Gait Assistance: Moderate Assistance - Patient 50-74% Gait Distance (Feet): 30 Feet Assistive device: Rolling walker Gait Assistance Details: Verbal cues for precautions/safety;Verbal cues for safe use of DME/AE;Manual facilitation for weight shifting Gait Gait: Yes Gait Pattern: Impaired Gait Pattern: Decreased stride length;Shuffle Stairs / Additional Locomotion Stairs: Yes Stairs Assistance: Moderate Assistance - Patient 50 - 74% Stair Management Technique: Two rails Number of Stairs: 4 Height of Stairs: 6 Wheelchair Mobility Wheelchair Mobility: Yes Wheelchair Assistance: Moderate Assistance - Patient 50 - 74% Wheelchair Propulsion: Both upper extremities Wheelchair Parts Management: Needs assistance Distance: 50   Pt received sitting in WC and agreeable to PT. PT instructed patient in PT Evaluation and initiated treatment intervention; see above for results. PT educated patient in [place], rehab potential, rehab goals, and discharge recommendations along with recommendation for follow-up rehabilitation services.  Pt transported to rehab gym. Gait training with RW 2 x 55f with min-mod assist from PT with assist for weight shift and use of RW to facilitate forward momentum of BLE. Pt hesitant to mobilize due to blindness and fear of falling. Pt performed stair management training with mod assist and BUE supported on Bil rails max cues for sequencing and step to gait pattern and improved awareness of position of rails. Stedy transfer to and from mat tbale with min assist and max cues  for awareness of part of stedy. Bed mobility with min assist as listed above on mat table with encouragement form PT due to fear of falling.   Car transfer with mod assist for safety and max cues from PT for awareness of position of car and car seat due to sight loss. Patient returned to room and [facility] and all needs met.      Discharge Criteria: Patient will be discharged from PT if patient refuses treatment 3 consecutive times without medical reason, if treatment goals not met, if there is a change in medical status, if patient makes no progress towards goals or if patient is discharged from hospital.  The above assessment, treatment plan, treatment alternatives and goals were discussed and mutually agreed upon: by patient  ALorie Phenix2/12/2022, 3:11 PM

## 2022-04-15 NOTE — Evaluation (Signed)
Occupational Therapy Assessment and Plan  Patient Details  Name: Grant Jensen. MRN: [id number] Date of Birth: [birth year]  OT Diagnosis: abnormal posture, blindness and low vision, cognitive deficits, hemiplegia affecting non-dominant side, and muscle weakness (generalized) Rehab Potential: Rehab Potential (ACUTE ONLY): Fair ELOS: 7-10 days   Today's Date: 04/15/2022 OT Individual Time: [id number] OT Individual Time Calculation (min): 73 min     Hospital Problem: Principal Problem:   Small vessel cerebrovascular accident (CVA) ([place])   Past Medical History:  Past Medical History:  Diagnosis Date   Blindness    Dementia ([place])    Diabetes mellitus without complication ([place])    Glaucoma    Hypertension    Past Surgical History:  Past Surgical History:  Procedure Laterality Date   LEFT HEART CATHETERIZATION WITH CORONARY ANGIOGRAM N/A 06/22/2014   Procedure: LEFT HEART CATHETERIZATION WITH CORONARY ANGIOGRAM;  Surgeon: Adrian Prows, MD;  Location: [facility] CATH LAB;  Service: Cardiovascular;  Laterality: N/A;    Assessment & Plan Clinical Impression:  Grant Jensen. Grant Jensen, [place]. is a 89 year old right-handed male with history significant for hypertension, CKD stage III, diabetes melitis with retinopathy/blindness since 1994, nonischemic cardiomyopathy maintained on low-dose aspirin, dementia maintained on Aricept.  Per chart review patient lives with spouse.  He does attend adult daycare 3 days a week.  1 level home 3 steps to entry.  Required bilateral hand-held assist versus rolling walker for mobility and guidance for all standing mobility due to his blindness.  Wife assist with ADLs.  Presented 04/09/2022 with acute onset of left-sided weakness and slurred speech.  Cranial CT scan negative.  CT angiogram head and neck one of the proximal M2 branches on the right severely stenosed to nearly occluded.  Moderate focal stenosis in the P2 segment of the right PCA.  MRI showed small foci of acute infarct  in the right periventricular posterior frontal lobe/corona radiata.  No hemorrhage or mass effect.  EEG performed showed mild diffuse slowing no seizure.  Echocardiogram with ejection fraction of 60 to 65% no wall motion abnormalities grade 1 diastolic dysfunction.  Patient did not receive tPA.  Admission chemistries unremarkable except glucose 162, creatinine 1.64, hemoglobin A1c 10.1.  Neurology follow-up placed on low-dose aspirin as well as Plavix for CVA prophylaxis x 3 weeks then Plavix alone.  Tolerating a regular consistency diet.  Therapy evaluations completed due to patient decreased functional mobility and left-sided weakness was admitted for a comprehensive rehab program. Patient transferred to CIR on 04/14/2022 .    Patient currently requires max with basic self-care skills secondary to muscle weakness, decreased cardiorespiratoy endurance, decreased coordination, decreased visual perceptual skills and blindness, decreased motor planning, decreased initiation, decreased awareness, decreased problem solving, decreased safety awareness, decreased memory, and delayed processing, and decreased sitting balance, decreased standing balance, and hemiplegia.  Prior to hospitalization, patient could complete all self-care with min-mod A provided by pt's wife.  Patient will benefit from skilled intervention to decrease level of assist with basic self-care skills and increase independence with basic self-care skills prior to discharge home with care partner.  Anticipate patient will require 24 hour supervision and minimal physical assistance and follow up home health.  OT - End of Session Activity Tolerance: Tolerates 10 - 20 min activity with multiple rests Endurance Deficit: Yes Endurance Deficit Description: Mild SOB with functional transfers and increased activity OT Assessment Rehab Potential (ACUTE ONLY): Fair OT Barriers to Discharge: Home environment access/layout;Inaccessible home  environment;Incontinence OT Patient demonstrates impairments in the following area(s):  Balance;Cognition;Endurance;Motor;Perception;Safety;Vision OT Basic ADL's Functional Problem(s): Eating;Bathing;Grooming;Dressing;Toileting OT Transfers Functional Problem(s): Toilet;Tub/Shower OT Additional Impairment(s): None OT Plan OT Intensity: Minimum of 1-2 x/day, 45 to 90 minutes OT Frequency: 5 out of 7 days OT Duration/Estimated Length of Stay: 7-10 days OT Treatment/Interventions: Balance/vestibular training;Cognitive remediation/compensation;Discharge planning;Pain management;Self Care/advanced ADL retraining;Therapeutic Activities;UE/LE Coordination activities;Functional mobility training;Patient/family education;Therapeutic Exercise;Visual/perceptual remediation/compensation;Disease mangement/prevention;Neuromuscular re-education;UE/LE Strength taining/ROM;Wheelchair propulsion/positioning;DME/adaptive equipment instruction OT Self Feeding Anticipated Outcome(s): Min A OT Basic Self-Care Anticipated Outcome(s): Min A OT Toileting Anticipated Outcome(s): Min A OT Bathroom Transfers Anticipated Outcome(s): CGA/supervision OT Recommendation Patient destination: Home Follow Up Recommendations: 24 hour supervision/assistance;Home health OT Equipment Recommended: To be determined   OT Evaluation Precautions/Restrictions  Precautions Precautions: Fall;Other (comment) Precaution Comments: blind, dementia Restrictions Weight Bearing Restrictions: No Home Living/Prior Functioning Home Living Family/patient expects to be discharged to:: Private residence Living Arrangements: Spouse/significant other Available Help at Discharge: Family, Available 24 hours/day (wife) Type of Home: House Home Access: Stairs to enter Technical brewer of Steps: 3 Entrance Stairs-Rails: Can reach both Home Layout: One level Bathroom Shower/Tub: Tub/shower unit, Architectural technologist: Standard Bathroom  Accessibility: Yes Additional Comments: goes to [place] day care 2 to 3 times per week ; paid by DSS; wife transports  Lives With: Spouse (wife "Grant Jensen") IADL History Homemaking Responsibilities: No Current License: No Leisure and Hobbies: listening to music Prior Function Level of Independence: Needs assistance with ADLs, Requires assistive device for independence, Needs assistance with gait, Needs assistance with tranfers Bath: Maximal Toileting: Minimal Grooming: Total Feeding: Total  Able to Take Stairs?: Yes Driving: No Vision Baseline Vision/History: 2 Legally blind (no outlines or shades; complete blindness) Ability to See in Adequate Light: 4 Severely impaired Patient Visual Report: No change from baseline Vision Assessment?: No apparent visual deficits Perception  Perception: Impaired (due to blindness) Praxis Praxis: Impaired Praxis Impairment Details: Perseveration;Initiation Cognition Cognition Overall Cognitive Status: History of cognitive impairments - at baseline Arousal/Alertness: Awake/alert Orientation Level: Person;Place;Situation Person: Oriented Place: Disoriented Situation: Disoriented Memory: Impaired Awareness: Impaired Problem Solving: Impaired Safety/Judgment: Impaired Brief Interview for Mental Status (BIMS) Repetition of Three Words (First Attempt): 1 Temporal Orientation: Year: Nonsensical Temporal Orientation: Month: Missed by more than 1 month Temporal Orientation: Day: Incorrect Recall: "Sock": No, could not recall Recall: "Blue": No, could not recall Recall: "Bed": No, could not recall BIMS Summary Score: 1 Sensation Sensation Light Touch: Appears Intact Hot/Cold: Not tested Proprioception: Appears Intact Stereognosis: Not tested Coordination Gross Motor Movements are Fluid and Coordinated: No Fine Motor Movements are Fluid and Coordinated: No Coordination and Movement Description: generalized incoordination due to  blindness, perception and balance deficits Finger Nose Finger Test: unable to formally test due to blindness Motor  Motor Motor: Hemiplegia  Trunk/Postural Assessment  Cervical Assessment Cervical Assessment: Exceptions to [facility] (forward head) Thoracic Assessment Thoracic Assessment: Exceptions to [facility] (thoracic rounding) Lumbar Assessment Lumbar Assessment: Exceptions to [facility] (posterior pelvic tilt in sitting) Postural Control Postural Control: Deficits on evaluation  Balance Balance Balance Assessed: Yes Static Sitting Balance Static Sitting - Balance Support: Feet supported;No upper extremity supported Static Sitting - Level of Assistance: 5: Stand by assistance (CGA) Dynamic Sitting Balance Dynamic Sitting - Balance Support: Feet supported;No upper extremity supported Dynamic Sitting - Level of Assistance: 5: Stand by assistance (CGA) Static Standing Balance Static Standing - Balance Support: During functional activity;No upper extremity supported Static Standing - Level of Assistance: 4: Min assist Dynamic Standing Balance Dynamic Standing - Balance Support: During functional activity;No upper extremity supported Dynamic Standing - Level of  Assistance: 4: Min assist Extremity/Trunk Assessment RUE Assessment RUE Assessment: Within Functional Limits LUE Assessment LUE Assessment: Exceptions to [facility] Active Range of Motion (AROM) Comments: 120 degrees shoulder flexion, WFL distally General Strength Comments: 3+/5 proximally, WFL distally  Care Tool Care Tool Self Care Eating   Eating Assist Level: Maximal Assistance - Patient 25 - 49%    Oral Care    Oral Care Assist Level: Maximal assistance - Patient 25 - 49%    Bathing   Body parts bathed by patient: Right arm;Left arm;Chest;Abdomen;Front perineal area;Right upper leg;Left upper leg;Face Body parts bathed by helper: Right lower leg;Left lower leg;Buttocks   Assist Level: Maximal Assistance - Patient 24 - 49%    Upper  Body Dressing(including orthotics)   What is the patient wearing?: Pull over shirt   Assist Level: Moderate Assistance - Patient 50 - 74%    Lower Body Dressing (excluding footwear)   What is the patient wearing?: Incontinence brief;Pants Assist for lower body dressing: Moderate Assistance - Patient 50 - 74%    Putting on/Taking off footwear   What is the patient wearing?: Non-skid slipper socks Assist for footwear: Dependent - Patient 0%       Care Tool Toileting Toileting activity   Assist for toileting: 2 Helpers     Care Tool Bed Mobility Roll left and right activity        Sit to lying activity        Lying to sitting on side of bed activity         Care Tool Transfers Sit to stand transfer   Sit to stand assist level: Minimal Assistance - Patient > 75%    Chair/bed transfer         Toilet transfer   Assist Level: Moderate Assistance - Patient 50 - 74%     Care Tool Cognition  Expression of Ideas and Wants Expression of Ideas and Wants: 3. Some difficulty - exhibits some difficulty with expressing needs and ideas (e.g, some words or finishing thoughts) or speech is not clear  Understanding Verbal and Non-Verbal Content Understanding Verbal and Non-Verbal Content: 2. Sometimes understands - understands only basic conversations or simple, direct phrases. Frequently requires cues to understand   Memory/Recall Ability Memory/Recall Ability : None of the above were recalled   Refer to Care Plan for Grant Jensen 1 OT Short Term Goal 1 (Week 1): STG = LTG due to ELOS  Recommendations for other services: None    Skilled Therapeutic Intervention Patient received semi-supine upon therapy arrival and agreeable to participate in OT evaluation. Wife present at bedside and assisted during eval. Education provided on OT purpose, therapy schedule, goals for therapy, and safety policy while in rehab. Patient demonstrates visual blindness and  cognitive impairments related to baseline dementia, motor planning, coordination, balance and general endurance deficits resulting in difficulty completing BADL tasks without increased physical assist. He required max-total verbal and tactile cues for all tasks, but physically could do more when given extended time for processing and initiation. Wife reports from an ADL level he is close to baseline, but he is different mobility wise. Pt will benefit from skilled OT services to focus on mentioned deficits. See below for ADL and functional transfer performance. Pt remained seated in w/c at conclusion of session with belt alarm on, nurse notified of pt status and all needs met at end of session.   ADL ADL Eating: Moderate assistance Where Assessed-Eating: Bed level Grooming:  Moderate assistance;Maximal cueing Where Assessed-Grooming: Bed level Upper Body Bathing: Supervision/safety;Maximal assistance Where Assessed-Upper Body Bathing: Bed level Lower Body Bathing: Minimal assistance;Maximal cueing Where Assessed-Lower Body Bathing: Bed level Upper Body Dressing: Moderate assistance;Maximal cueing Where Assessed-Upper Body Dressing: Edge of bed Lower Body Dressing: Moderate assistance;Maximal cueing Where Assessed-Lower Body Dressing: Edge of bed Toileting: Other (Comment) (2 helpers for safety, 1 physical assist, 1 stabilizing w/c and RW) Where Assessed-Toileting: Other (Comment) (simulated at EOB) Toilet Transfer: Moderate assistance;Maximal verbal cueing Toilet Transfer Method: Stand pivot Toilet Transfer Equipment: Other (comment) (simulated to w/c) Tub/Shower Transfer: Not assessed Tub/Shower Transfer Method: Unable to assess Social research officer, government: Not assessed Social research officer, government Method: Unable to assess Mobility  Bed Mobility Bed Mobility: Rolling Right;Rolling Left;Supine to Sit Rolling Right: Minimal Assistance - Patient > 75% Rolling Left: Minimal Assistance - Patient >  75% Supine to Sit: Moderate Assistance - Patient 50-74% Transfers Sit to Stand: Minimal Assistance - Patient > 75%   Discharge Criteria: Patient will be discharged from OT if patient refuses treatment 3 consecutive times without medical reason, if treatment goals not met, if there is a change in medical status, if patient makes no progress towards goals or if patient is discharged from hospital.  The above assessment, treatment plan, treatment alternatives and goals were discussed and mutually agreed upon: by patient and by family  Grant Mess, MS, OTR/L  04/15/2022, 12:15 PM

## 2022-04-15 NOTE — Progress Notes (Signed)
Physical Therapy Session Note  Patient Details  Name: Grant Jensen. MRN: [id number] Date of Birth: [birth year]  Today's Date: 04/15/2022 PT Individual Time: [id number] PT Individual Time Calculation (min): 40 min  Today's Date: 04/15/2022 PT Missed Time: 20 Minutes Missed Time Reason: Other (Comment) (eating lunch)  Short Term Goals: Week 1:   STG=LTG due to LOS  Skilled Therapeutic Interventions/Progress Updates:   Received pt sitting in [facility] with wife assisting with feeding - reported pt just received lunch and requesting time to eat. Upon returning, pt agreeable to PT treatment and denied any pain during session. Pt completley blind, HOH, and with dementia - therefore required constant cues for orientation. Session with emphasis on functional mobility/transfers, generalized strengthening and endurance, family education, and gait training. Pt transported to/from room in [facility] dependently for time management purposes. Pt stood with RW and min A and ambulated 77f x 1 with RW and min A with wife providing WC follow and therapist steering RW - cues for proximity to RW as pt pushes it too far forward. Pt's wife demonstrated how she guides pt with RW turned sideways, pt holding one side with both hands, while she drags it on the other side. Recommended that pt walk inside RW with hands on each side and that wife remain on pt's L side and hold onto him with one hand while guiding RW with the other to ensure safety - she verbalized understanding.   [facility] and min A provided by wife and ambulated additional 262fwith RW and min A provided by wife - wife demonstrating good carry over with cues for technique, hand placement, body mechanics, and use of gait belt. Pt's wife reported pt is near baseline with mobility and demonstrated confidence with ability to assist pt with mobility. Discussed potential D/C beginning of next week, but will discuss further with treatment team on Monday.  Recommended getting ramp installed despite pt practicing with stair navigation earlier this morning. Also discussed DME and placed order recommendation for 18x18 manual WC and RW. Returned to room and concluded session with pt sitting in WC left in care of wife. Cleared pt's wife to assist with transfers and mobility in room - safety plan updated and RN notified. 20 minutes missed of skilled physical therapy due to eating lunch.   Therapy Documentation Precautions:  Restrictions Weight Bearing Restrictions: No  Therapy/Group: Individual Therapy AnAlfonse AlpersT, DPT  04/15/2022, 7:24 AM

## 2022-04-16 LAB — GLUCOSE, CAPILLARY
Glucose-Capillary: 103 mg/dL — ABNORMAL HIGH (ref 70–99)
Glucose-Capillary: 137 mg/dL — ABNORMAL HIGH (ref 70–99)
Glucose-Capillary: 148 mg/dL — ABNORMAL HIGH (ref 70–99)
Glucose-Capillary: 112 mg/dL — ABNORMAL HIGH (ref 70–99)

## 2022-04-16 MED ORDER — TRAZODONE HCL 50 MG PO TABS: 25.0000 mg | ORAL_TABLET | Freq: Every evening | ORAL | Status: DC | PRN

## 2022-04-16 MED ORDER — TRAZODONE HCL 50 MG PO TABS: 25.0000 mg | ORAL_TABLET | Freq: Every day | ORAL | Status: DC

## 2022-04-16 MED ORDER — MELATONIN 3 MG PO TABS
3.0000 mg | ORAL_TABLET | Freq: Every day | ORAL | Status: DC
  Administered 2022-04-16 – 2022-04-18 (×3): 3 mg via ORAL
  Filled 2022-04-16 (×3): qty 1

## 2022-04-16 NOTE — Progress Notes (Signed)
PROGRESS NOTE   Subjective/Complaints: Seen at bedside. No new concerns noted today.   Review of Systems  Constitutional:  Negative for chills, fever and malaise/fatigue.  Eyes:        Total blindness  Respiratory:  Negative for shortness of breath.   Cardiovascular:  Negative for chest pain.  Gastrointestinal:  Negative for abdominal pain.  Genitourinary: Negative.   Musculoskeletal:  Positive for joint pain.  Neurological:  Positive for speech change and weakness.    Objective:   No results found. Recent Labs    04/14/22 0442  WBC 7.6  HGB 12.2*  HCT 39.0  PLT 180    Recent Labs    04/14/22 0616  NA 138  K 4.4  CL 102  CO2 22  GLUCOSE 110*  BUN 16  CREATININE 1.48*  CALCIUM 9.2     Intake/Output Summary (Last 24 hours) at 04/16/2022 1243 Last data filed at 04/16/2022 0815 Gross per 24 hour  Intake 696 ml  Output --  Net 696 ml         Physical Exam: Vital Signs Blood pressure (!) 142/63, pulse 66, temperature 98.5 F (36.9 C), temperature source Oral, resp. rate 18, height 5' 5"$  (1.651 m), weight 76.4 kg, SpO2 100 %.   General: Alert and oriented x 3, in bed, NAD HEENT: Head is normocephalic, atraumatic, MMM Neck: Supple without JVD or lymphadenopathy Heart: Reg rate and rhythm. No murmurs rubs or gallops Chest: CTA bilaterally without wheezes, rales, or rhonchi; no distress Abdomen: Soft, non-tender, non-distended, bowel sounds positive. Extremities: No clubbing, cyanosis, or edema. Pulses are 2+ Skin: warm and dry Musculoskeletal:        General: No swelling or tenderness.     Cervical back: Normal range of motion.     Comments: No pain with ROM or palpation  Neurological:     Mental Status: He is alert.     Comments: Patient alert and oriented to self only, sitting up in bed.  Follows simple commands.   Blind both eyes. Left facial droop. Speech slurred.  HOH, Moves right side  preferentially over left. RUE 4/5. LUE 4-/5. RLE 4/5 LLE 3+ prox to 4/5 distally.  Feet hypersensitive to touch. No abnl tone  Psychiatric:     Comments: cooperative and pleasant   Assessment/Plan: 1. Functional deficits which require 3+ hours per day of interdisciplinary therapy in a comprehensive inpatient rehab setting. Physiatrist is providing close team supervision and 24 hour management of active medical problems listed below. Physiatrist and rehab team continue to assess barriers to discharge/monitor patient progress toward functional and medical goals  Care Tool:  Bathing    Body parts bathed by patient: Right arm, Left arm, Chest, Abdomen, Front perineal area, Right upper leg, Left upper leg, Face   Body parts bathed by helper: Right lower leg, Left lower leg, Buttocks     Bathing assist Assist Level: Maximal Assistance - Patient 24 - 49%     Upper Body Dressing/Undressing Upper body dressing   What is the patient wearing?: Pull over shirt    Upper body assist Assist Level: Moderate Assistance - Patient 50 - 74%    Lower Body  Dressing/Undressing Lower body dressing      What is the patient wearing?: Incontinence brief, Pants     Lower body assist Assist for lower body dressing: Moderate Assistance - Patient 50 - 74%     Toileting Toileting    Toileting assist Assist for toileting: 2 Helpers     Transfers Chair/bed transfer  Transfers assist     Chair/bed transfer assist level: Moderate Assistance - Patient 50 - 74%     Locomotion Ambulation   Ambulation assist      Assist level: Moderate Assistance - Patient 50 - 74% Assistive device: Walker-rolling Max distance: 30   Walk 10 feet activity   Assist     Assist level: Moderate Assistance - Patient - 50 - 74% Assistive device: Walker-rolling   Walk 50 feet activity   Assist Walk 50 feet with 2 turns activity did not occur: Safety/medical concerns         Walk 150 feet  activity   Assist Walk 150 feet activity did not occur: Safety/medical concerns         Walk 10 feet on uneven surface  activity   Assist Walk 10 feet on uneven surfaces activity did not occur: Safety/medical concerns         Wheelchair     Assist Is the patient using a wheelchair?: Yes Type of Wheelchair: Manual    Wheelchair assist level: Moderate Assistance - Patient 50 - 74% Max wheelchair distance: 50    Wheelchair 50 feet with 2 turns activity    Assist        Assist Level: Moderate Assistance - Patient 50 - 74%   Wheelchair 150 feet activity     Assist      Assist Level: Maximal Assistance - Patient 25 - 49%   Blood pressure (!) 142/63, pulse 66, temperature 98.5 F (36.9 C), temperature source Oral, resp. rate 18, height 5' 5"$  (1.651 m), weight 76.4 kg, SpO2 100 %.  Medical Problem List and Plan: 1. Functional deficits secondary to right periventricular posterior frontal lobe infarct due to small vessel disease             -patient may shower             -ELOS/Goals: 7-10 days, min assist goals with PT, OT. Min to max with SLP. Pt needs to be more able to assist with care so that his wife can bring him home.  -CIR continue PT OT SLP 2.  Antithrombotics: -DVT/anticoagulation:  Mechanical: Antiembolism stockings, thigh (TED hose) Bilateral lower extremities             -antiplatelet therapy: Aspirin 81 mg daily and Plavix 75 mg daily x 3 weeks then Plavix alone 3. Pain Management: Tylenol as needed 4. Mood/Behavior/Sleep: Aricept 5 mg nightly, Atarax 10 mg nightly as needed sleep             -antipsychotic agents: N/A             -keep sleep chart 5. Neuropsych/cognition: This patient is not capable of making decisions on his own behalf. 6. Skin/Wound Care: Routine skin checks 7. Fluids/Electrolytes/Nutrition: Routine in and outs with follow-up chemistries.             -po intake has generally been steady 8.  Hyperlipidemia.  Continue  Crestor 20 mg daily 9.  Diabetes mellitus.  Hemoglobin A1c 10.1.  Currently on SSI.  Patient on Amaryl 1 mg daily, Glucophage 500 mg twice daily prior to admission.  Resume as needed             -2/11 well controlled continue current  CBG (last 3)  Recent Labs    04/15/22 2115 04/16/22 0554 04/16/22 1213  GLUCAP 140* 103* 137*    10.  Permissive hypertension.  Currently on no antihypertensive medication.  Patient on Cozaar 25 mg daily, Toprol-XL 25 mg daily and Imdur 30 mg daily prior to admission.  Resume as needed  -2/11 well controlled     04/16/2022    5:00 AM 04/16/2022    4:31 AM 04/15/2022    7:42 PM  Vitals with BMI  Weight 168 lbs 7 oz    BMI [id number]    Systolic  [id number]  Diastolic  63 74  Pulse  66 66     11.  CKD stage III.  Last creatinine 1.48 on 2/9 follow-up chemistries on Monday 12.  Total blindness.  Resume eyedrops.      LOS: 2 days A FACE TO FACE EVALUATION WAS PERFORMED  Jennye Boroughs 04/16/2022, 12:43 PM   `pmr

## 2022-04-16 NOTE — Progress Notes (Signed)
Contacted on call PA for orders for sleep med due to patient only sleeping 30 minutes night before.

## 2022-04-16 NOTE — Discharge Instructions (Addendum)
Inpatient Rehab Discharge Instructions  Grant Jensen. Discharge date and time: No discharge date for patient encounter.   Activities/Precautions/ Functional Status: Activity: As tolerated Diet: diabetic diet Wound Care: Routine skin checks Functional status:  ___ No restrictions     ___ Walk up steps independently ___ 24/7 supervision/assistance   ___ Walk up steps with assistance ___ Intermittent supervision/assistance  ___ Bathe/dress independently ___ Walk with walker     _x__ Bathe/dress with assistance ___ Walk Independently    ___ Shower independently ___ Walk with assistance    ___ Shower with assistance ___ No alcohol     ___ Return to work/school ________  Special Instructions: Continue aspirin 81 mg daily and Plavix 75 mg daily through 04/30/2022 then Plavix alone  STROKE/TIA DISCHARGE INSTRUCTIONS SMOKING Cigarette smoking nearly doubles your risk of having a stroke & is the single most alterable risk factor  If you smoke or have smoked in the last 12 months, you are advised to quit smoking for your health. Most of the excess cardiovascular risk related to smoking disappears within a year of stopping. Ask you doctor about anti-smoking medications [place] Quit Line: 1-800-QUIT NOW Free Smoking Cessation Classes [phone]  CHOLESTEROL Know your levels; limit fat & cholesterol in your diet  Lipid Panel     Component Value Date/Time   CHOL 152 04/10/2022 0302   TRIG 137 04/10/2022 0302   HDL 35 (L) 04/10/2022 0302   CHOLHDL 4.3 04/10/2022 0302   VLDL 27 04/10/2022 0302   LDLCALC 90 04/10/2022 0302     Many patients benefit from treatment even if their cholesterol is at goal. Goal: Total Cholesterol (CHOL) less than 160 Goal:  Triglycerides (TRIG) less than 150 Goal:  HDL greater than 40 Goal:  LDL (LDLCALC) less than 100   BLOOD PRESSURE American Stroke Association blood pressure target is less that 120/80 mm/Hg  Your discharge blood pressure is:  BP: 130/61  Monitor your blood pressure Limit your salt and alcohol intake Many individuals will require more than one medication for high blood pressure  DIABETES (A1c is a blood sugar average for last 3 months) Goal HGBA1c is under 7% (HBGA1c is blood sugar average for last 3 months)  Diabetes:   Lab Results  Component Value Date   HGBA1C 10.1 (H) 04/10/2022    Your HGBA1c can be lowered with medications, healthy diet, and exercise. Check your blood sugar as directed by your physician Call your physician if you experience unexplained or low blood sugars.  PHYSICAL ACTIVITY/REHABILITATION Goal is 30 minutes at least 4 days per week  Activity: Increase activity slowly, Therapies: Physical Therapy: Home Health Return to work:  Activity decreases your risk of heart attack and stroke and makes your heart stronger.  It helps control your weight and blood pressure; helps you relax and can improve your mood. Participate in a regular exercise program. Talk with your doctor about the best form of exercise for you (dancing, walking, swimming, cycling).  DIET/WEIGHT Goal is to maintain a healthy weight  Your discharge diet is:  Diet Order             Diet Carb Modified Fluid consistency: Thin; Room service appropriate? Yes  Diet effective now                   liquids Your height is:  Height: 5' 5"$  (165.1 cm) Your current weight is: Weight: 76.4 kg Your Body Mass Index (BMI) is:  BMI (Calculated): 28.03 Following  the type of diet specifically designed for you will help prevent another stroke. Your goal weight range is:   Your goal Body Mass Index (BMI) is 19-24. Healthy food habits can help reduce 3 risk factors for stroke:  High cholesterol, hypertension, and excess weight.  RESOURCES Stroke/Support Group:  Call [phone]   STROKE EDUCATION PROVIDED/REVIEWED AND GIVEN TO PATIENT Stroke warning signs and symptoms How to activate emergency medical system (call 911). Medications prescribed at  discharge. Need for follow-up after discharge. Personal risk factors for stroke. Pneumonia vaccine given: No Flu vaccine given: No My questions have been answered, the writing is legible, and I understand these instructions.  I will adhere to these goals & educational materials that have been provided to me after my discharge from the hospital.      My questions have been answered and I understand these instructions. I will adhere to these goals and the provided educational materials after my discharge from the hospital.  Patient/Caregiver Signature _______________________________ Date __________  Clinician Signature _______________________________________ Date __________  Please bring this form and your medication list with you to all your follow-up doctor's appointments. Inpatient Rehab Discharge Instructions  Grant Jensen. Discharge date and time: No discharge date for patient encounter.   Activities/Precautions/ Functional Status: Activity: As tolerated Diet: Diabetic diet Wound Care: Routine skin checks Functional status:  ___ No restrictions     ___ Walk up steps independently ___ 24/7 supervision/assistance   ___ Walk up steps with assistance ___ Intermittent supervision/assistance  ___ Bathe/dress independently ___ Walk with walker     _x__ Bathe/dress with assistance ___ Walk Independently    ___ Shower independently ___ Walk with assistance    ___ Shower with assistance ___ No alcohol     ___ Return to work/school ________  Special Instructions:  No driving smoking or alcohol  Aspirin 81 mg daily and Plavix 75 mg daily until 04/30/2022 then Plavix alone  My questions have been answered and I understand these instructions. I will adhere to these goals and the provided educational materials after my discharge from the hospital.  Patient/Caregiver Signature _______________________________ Date __________  Clinician Signature  _______________________________________ Date __________  Please bring this form and your medication list with you to all your follow-up doctor's appointments.

## 2022-04-17 LAB — CBC WITH DIFFERENTIAL/PLATELET
Abs Immature Granulocytes: 0.01 10*3/uL (ref 0.00–0.07)
Basophils Absolute: 0 10*3/uL (ref 0.0–0.1)
Basophils Relative: 1 %
Eosinophils Absolute: 0.8 10*3/uL — ABNORMAL HIGH (ref 0.0–0.5)
Eosinophils Relative: 10 %
HCT: 41.1 % (ref 39.0–52.0)
Hemoglobin: 13 g/dL (ref 13.0–17.0)
Immature Granulocytes: 0 %
Lymphocytes Relative: 52 %
Lymphs Abs: 4 10*3/uL (ref 0.7–4.0)
MCH: 24.6 pg — ABNORMAL LOW (ref 26.0–34.0)
MCHC: 31.6 g/dL (ref 30.0–36.0)
MCV: 77.8 fL — ABNORMAL LOW (ref 80.0–100.0)
Monocytes Absolute: 0.5 10*3/uL (ref 0.1–1.0)
Monocytes Relative: 7 %
Neutro Abs: 2.3 10*3/uL (ref 1.7–7.7)
Neutrophils Relative %: 30 %
Platelets: 211 10*3/uL (ref 150–400)
RBC: 5.28 MIL/uL (ref 4.22–5.81)
RDW: 14.3 % (ref 11.5–15.5)
WBC: 7.6 10*3/uL (ref 4.0–10.5)
nRBC: 0 % (ref 0.0–0.2)

## 2022-04-17 LAB — COMPREHENSIVE METABOLIC PANEL
ALT: 24 U/L (ref 0–44)
AST: 40 U/L (ref 15–41)
Albumin: 4.1 g/dL (ref 3.5–5.0)
Alkaline Phosphatase: 60 U/L (ref 38–126)
Anion gap: 10 (ref 5–15)
BUN: 16 mg/dL (ref 8–23)
CO2: 23 mmol/L (ref 22–32)
Calcium: 9.5 mg/dL (ref 8.9–10.3)
Chloride: 103 mmol/L (ref 98–111)
Creatinine, Ser: 1.59 mg/dL — ABNORMAL HIGH (ref 0.61–1.24)
GFR, Estimated: 41 mL/min — ABNORMAL LOW (ref >60)
Glucose, Bld: 123 mg/dL — ABNORMAL HIGH (ref 70–99)
Potassium: 4.1 mmol/L (ref 3.5–5.1)
Sodium: 136 mmol/L (ref 135–145)
Total Bilirubin: 0.3 mg/dL (ref 0.3–1.2)
Total Protein: 7.3 g/dL (ref 6.5–8.1)

## 2022-04-17 LAB — GLUCOSE, CAPILLARY
Glucose-Capillary: 82 mg/dL (ref 70–99)
Glucose-Capillary: 116 mg/dL — ABNORMAL HIGH (ref 70–99)
Glucose-Capillary: 127 mg/dL — ABNORMAL HIGH (ref 70–99)

## 2022-04-17 MED ORDER — LIVING WELL WITH DIABETES BOOK
Freq: Once | Status: AC
  Filled 2022-04-17: qty 1

## 2022-04-17 NOTE — Care Management (Deleted)
[facility] Individual Statement of Services  Patient Name:  Grant Jensen.  Date:  04/17/2022  [facility].  Our goal is to provide you with an individualized program based on your diagnosis and situation, designed to meet your specific needs.  With this comprehensive rehabilitation program, you will be expected to participate in at least 3 hours of rehabilitation therapies Monday-Friday, with modified therapy programming on the weekends.  Your rehabilitation program will include the following services:  Physical Therapy (PT), Occupational Therapy (OT), 24 hour per day rehabilitation nursing, Therapeutic Recreaction (TR), Psychology, Neuropsychology, Care Coordinator, Rehabilitation Medicine, [place], and Other  Weekly team conferences will be held on Wednesdays to discuss your progress.  Your Inpatient Rehabilitation Care Coordinator will talk with you frequently to get your input and to update you on team discussions.  Team conferences with you and your family in attendance may also be held.  Expected length of stay: 5-10 days    Overall anticipated outcome: Minimal Assistance  Depending on your progress and recovery, your program may change. Your Inpatient Rehabilitation Care Coordinator will coordinate services and will keep you informed of any changes. Your Inpatient Rehabilitation Care Coordinator's name and contact numbers are listed  below.  The following services may also be recommended but are not provided by the [place] will be made to provide these services after discharge if needed.  Arrangements include referral to agencies that provide these services.  Your insurance has been verified to be:  [facility] Medicare  Your primary doctor is:   Forensic psychologist PA  Pertinent information will be shared with your doctor and your insurance company.  Inpatient Rehabilitation Care Coordinator:  Cathleen Corti [phone] or [phone]  Information discussed with and copy given to patient by: Rana Snare, 04/17/2022, 12:31 PM

## 2022-04-17 NOTE — Progress Notes (Signed)
Occupational Therapy Session Note  Patient Details  Name: Grant Jensen. MRN: [id number] Date of Birth: 11-18-1928  Today's Date: 04/17/2022 OT Individual Time: 1105-1200 OT Individual Time Calculation (min): 55 min    Short Term Goals: Week 1:  OT Short Term Goal 1 (Week 1): STG = LTG due to ELOS  Skilled Therapeutic Interventions/Progress Updates:  Skilled OT intervention completed with focus on family education with wife assisting pt for all stands, transfers and self-care tasks. Pt received seated in recliner, agreeable to session. No pain reported.  Confirmed that wife has TTB at home as practiced earlier in previous OT session, to prepare for DC with [facility] also reported as available at home. OT encouraged the use of both of these for showers and toileting needs.  Wife had questions about alarms for bed/chair as the bed he will have at home doesn't have the feature that nursing uses in hospital to prevent pt from getting up without assist especially with restless throughout the night. OT offered suggestions and provided handouts on pad alarms that can be purchased off online however advised against the "restraint" type of belt as wife inquired about due to safety and other implications as in hospital a MD order has to be placed for such use.  Wife assisted pt with min A sit > stand using RW from recliner then min A ambulatory transfer to w/c with wife guiding the RW for pt due to baseline blindness. Upon standing, back of pants noted to be saturated however pt unaware and denied brief being wet. Wife provided max A doffing of brief, peri-care and re-threading and donning of pants. This is baseline for pt and wife, though pt could be less assist if given time with multi modal cues.  Advised the wife guide his LUE onto RW then verbally cue him to stand vs pulling on his UE/body to facilitate movement. Once she did this method, pt responded well and was able to stand with CGA with time. Wife  also reported less strain on her back, therefore OT offered other ways to protect her body/health while assisting for longevity of care giving responsibilities.  Wife assisted pt with ambulation in hallway for about 30 ft, then seated rest break and another 30 ft with min A for RW navigation. Good recall on standing on pt's hemi-side, and locking w/c breaks. Had them practice and ambulatory transfer in room into narrow doorway to simulate a time where she can't be on the L with wife indicating she prefers to stand in front of pt and guide him into bathroom however we reviewed alternative methods such as behind pt in case of posterior LOB which is > likely than forwards due to use of RW.  Pt remained seated in recliner, preparing for lunch, with wife present therefore alarm left off. All needs in reach at end of session.   Therapy Documentation Precautions:  Precautions Precautions: Fall, Other (comment) Precaution Comments: blind, dementia Restrictions Weight Bearing Restrictions: No    Therapy/Group: Individual Therapy  Blase Mess, MS, OTR/L  04/17/2022, 12:21 PM

## 2022-04-17 NOTE — Progress Notes (Signed)
Occupational Therapy Session Note  Patient Details  Name: Grant Jensen. MRN: [id number] Date of Birth: [birth year]  Today's Date: 04/17/2022 OT Individual Time: 1001-1045 OT Individual Time Calculation (min): 44 min    Short Term Goals: Week 1:  OT Short Term Goal 1 (Week 1): STG = LTG due to ELOS  Skilled Therapeutic Interventions/Progress Updates:    Patient agreeable to participate in OT session. Reports 0/10 pain level.   Patient participated in skilled OT session focusing on family education regarding functional mobility, RW management, tub/shower transfer, and compensatory strategies to utilize during mobility. Therapist educated wife and sister on DME to use for tub/shower transfer including shower chair with back and ETB in order to promote increased safety during task and increase functional performance of pt during tub/shower transfer. Wife reports using a small metal chair inside tub with pt transferring buttocks first before swinging legs into tub. Wife completed tub/shower transfer using both ETB and shower chair with use of ETB highly recommended by OT due to increased safety and ease for both wife and pt. Wife reports that her mother used one and she has it in storage. Education provided to wife on compensatory techniques for functional mobility including descriptive directions, tactile cues (tap on shoulder for right/left directions), provide direction prior to pt moving to increase carry over, and allowing pt to catch up to walker when moving it for him. Wife completed all functional transfers and mobility with pt while OT provided education and tips for task modification.     Therapy Documentation Precautions:  Precautions Precautions: Fall, Other (comment) Precaution Comments: blind, dementia Restrictions Weight Bearing Restrictions: No   Therapy/Group: Individual Therapy  Ailene Ravel, OTR/L,CBIS  Supplemental OT - [facility] and [facility] Secure Chat Preferred    04/17/2022, 7:59 AM

## 2022-04-17 NOTE — Progress Notes (Addendum)
Patient ID: Reola Calkins., male   DOB: 04/25/1928, 89 y.o.   MRN: [id number]  This SW covering for primary SW, [place].  [place] made contact with pt wife [name] to inform on ELOS. She reported that she was informed pt will d/c on Wednesday due to gains made in rehab. SW informed will confirm with medical team, and SW will order w/c and RW; state she has 3in1 BSC that was given to her by her mother.   *SW followed up with medical team to confirm d/c Wednesday.   SW ordered RW and w/c with [employer] via parachute.  *SW later met with pt and wife in room to complete assessment. SW confirmed discharge. SW provided [facility], The list as unsure on preferred therapies yet. She is aware primary SW will follow-up.  Per therapy, pt will need hospital bed.   1435-SW informed pt wife hospital bed has been ordered.   *therapy confirms pt will need HH therapies- HHPT/OT/aide.  SW received phone call from pt wife reporting that she will need a 3in1 BSC. States the one she has is old.   SW ordered 3in1 BSC with [employer] via parachute.  Loralee Pacas, MSW, [place] Office: [phone] Cell: [phone] Fax: [phone]

## 2022-04-17 NOTE — Progress Notes (Addendum)
Met with patient and wife. Oriented to rehab. She reports that diabetes is a new dx but then stated was taking oral medications at home. She was unable to verbalize an appropriate diet. She also reports that they were sharing a glucometer at home?? She also reports that was taking oral DM meds as prescribed?? Will patient Not be going home on insulin. She also reports that blood sugar would be low at times so MD lowered dose of oral diabetic medications. Discussed going home most likely on plavix and ASA and then will be switched to ASA alone. Reports that spends 4 or 5 days a week at [facility] for "school". Will place order for RD to wife. Provided additional information pertaining to hyper/hypoglycemia and record for CBD checks.

## 2022-04-17 NOTE — Progress Notes (Signed)
Physical Therapy Session Note  Patient Details  Name: Grant Jensen. MRN: [id number] Date of Birth: 12/20/1928  Today's Date: 04/17/2022 PT Individual Time: [id number] and 1400-1457 PT Individual Time Calculation (min): 68 min and 57 min  Short Term Goals: Week 1:  PT Short Term Goal 1 (Week 1): STG=LTG due to ELOS  Skilled Therapeutic Interventions/Progress Updates:   Treatment Session 1 Received pt sitting in recliner with wife, [place], and daughter present for family education training. Session with emphasis on discharge planning, functional mobility/transfers, generalized strengthening and endurance, dynamic standing balance/coordination, stair navigation, and simulated car transfers. Pt transferred recliner<>WC stand<>pivot with RW and min A provided by [place] - cues to have pt push up from recliner rather than pulling up on RW for safety and to decrease caregiver burden. Pt transported to/from room in [facility] dependently due to blindness.   [name] reports pt has 2 STE + landing + 1 additional step - reinforced recommendation to get ramp installed and [name] in agreement. [name] requested to demonstrate to therapist how they did it before - stood from [facility] with mod HHA and guided pt to staircase where he navigated 4 6in steps with R handrail and min HHA provided by [facility] with therapist on close standby and occasionally providing CGA/light min A. Recommended that [name] have pt walk from car to stairs with RW, set RW aside, make sure pt has both hands on rails while she takes RW to top of steps, then to be behind pt while ascending stairs and in front of pt while descending stairs with pt holding onto both rails instead of HHA. Pt took seated rest break then practiced 4 6in steps again with technique recommended by therapist. Pt demonstrated significant improvements in stability and much less fearful of falling overall - reinforced that this is recommended technique.    Pt transported to ortho gym and  [name] demonstrated technique they use to get in/out of car. Pt stood from [facility] with RW and min A provided by [place] and ambulated to car - [name] then tossed RW aside and guided pt with bilateral HHA to car with max cues for sequencing when turning then provided mod A to get LEs into car. Suggested using RW entire time and not abandoning it for improved safety/stability. Pt then performed ambulatory simulated car transfer again with therapist with min/mod A overall. Pt stood from [facility] with RW and min A with cues for 1UE on RW and 1UE on WC armrest and guided pt to car by steering RW and provided mod A to get BLEs into car but pt able to get LEs out of car without assist. Returned to [facility] in same manner by guiding RW with cues to "follow the walker" and pt tends to do well with this cue. Educated [place] that pt can do much more for himself if she allows him time to do so.   [name] with questions regarding RW vs rollator. Educated [place] of safety risks associated with rollator with ultimate recommendation to use RW at all times - she verbalized understanding. Returned to room and [place] assisted with transfer from WC<>recliner with RW via stand<>pivot - cued [name] to ensure brakes are locked prior to transferring and reminder to use RW vs HHA. Discussed recommendation for hospital bed at home due to security of rails and features to assist with bed mobility - [name] reports she will think about it. Concluded session with pt sitting in recliner with all needs within reach and [place] and daughter  present at bedside.   Treatment Session 2 Received pt sitting in recliner with wife, [place], and daughter present for continued family education training. Session with emphasis on discharge planning, functional mobility/transfers, generalized strengthening and endurance, and gait training. Pt found to be incontinet of urine. Performed multiple stands with RW and min A provided by [name] to change wet brief and put on clean one. Pt  transferred recliner<>WC stand<>pivot with RW and min A provided by [facility] with cues to "follow the RW" - cues to remind [name] of brake safety. Pt transported to/from room in [facility] dependently due to blindness. [name] reports pt has regular queen size bed; therefore practiced bed mobility from bed in rehab apartment. Pt stood from [facility] with RW and min A and transferred WC<>bed stand<>pivot with RW and min A. Pt transferred sit<>supine with max A for BLE management and able to transfer supine<>sitting EOB from flat bed with mod A - discussed recommendation for hospital bed and discussed with CSW. In dayroom, pt stood from [facility] with RW and min A provided by [name] and ambulated 73f with RW and min A to steer RW. Pt reported urge to have BM and returned to room and transferred on/off toilet with bedside commode over top with RW and min A and dependent for clothing management. Pt then transferred WC<>recliner stand<>pivot with RW and min A and [facility] and [name] present at bedside.   Therapy Documentation Precautions:  Precautions Precautions: Fall, Other (comment) Precaution Comments: blind, dementia Restrictions Weight Bearing Restrictions: No  Therapy/Group: Individual Therapy AAlfonse AlpersPT, DPT  04/17/2022, 6:52 AM

## 2022-04-17 NOTE — Progress Notes (Signed)
PROGRESS NOTE   Subjective/Complaints: No new complaints this morning Using commode Wife and daughter are at bedside and are excited for d/c on Wednesday  Review of Systems  Constitutional:  Negative for chills, fever and malaise/fatigue.  Eyes:        Total blindness  Respiratory:  Negative for shortness of breath.   Cardiovascular:  Negative for chest pain.  Gastrointestinal:  Negative for abdominal pain.  Genitourinary: Negative.   Musculoskeletal:  Positive for joint pain.  Neurological:  Positive for speech change and weakness.    Objective:   No results found. Recent Labs    04/17/22 0819  WBC 7.6  HGB 13.0  HCT 41.1  PLT 211    Recent Labs    04/17/22 0819  NA 136  K 4.1  CL 103  CO2 23  GLUCOSE 123*  BUN 16  CREATININE 1.59*  CALCIUM 9.5     Intake/Output Summary (Last 24 hours) at 04/17/2022 1159 Last data filed at 04/16/2022 1325 Gross per 24 hour  Intake 240 ml  Output --  Net 240 ml         Physical Exam: Vital Signs Blood pressure 128/66, pulse 61, temperature 98 F (36.7 C), temperature source Oral, resp. rate 16, height 5' 5"$  (1.651 m), weight 76.4 kg, SpO2 100 %.   General: Alert and oriented x 3, in bed, NAD, BMI 28.03 HEENT: Head is normocephalic, atraumatic, MMM Neck: Supple without JVD or lymphadenopathy Heart: Reg rate and rhythm. No murmurs rubs or gallops Chest: CTA bilaterally without wheezes, rales, or rhonchi; no distress Abdomen: Soft, non-tender, non-distended, bowel sounds positive. Extremities: No clubbing, cyanosis, or edema. Pulses are 2+ Skin: warm and dry Musculoskeletal:        General: No swelling or tenderness.     Cervical back: Normal range of motion.     Comments: No pain with ROM or palpation  Neurological:     Mental Status: He is alert.     Comments: Patient alert and oriented to self only, sitting up in bed.  Follows simple commands.   Blind  both eyes. Left facial droop. Speech slurred.  HOH, Moves right side preferentially over left. RUE 4/5. LUE 4-/5. RLE 4/5 LLE 3+ prox to 4/5 distally.  Feet hypersensitive to touch. No abnl tone  Psychiatric:     Comments: cooperative and pleasant   Assessment/Plan: 1. Functional deficits which require 3+ hours per day of interdisciplinary therapy in a comprehensive inpatient rehab setting. Physiatrist is providing close team supervision and 24 hour management of active medical problems listed below. Physiatrist and rehab team continue to assess barriers to discharge/monitor patient progress toward functional and medical goals  Care Tool:  Bathing    Body parts bathed by patient: Right arm, Left arm, Chest, Abdomen, Front perineal area, Right upper leg, Left upper leg, Face   Body parts bathed by helper: Right lower leg, Left lower leg, Buttocks     Bathing assist Assist Level: Maximal Assistance - Patient 24 - 49%     Upper Body Dressing/Undressing Upper body dressing   What is the patient wearing?: Pull over shirt    Upper body assist Assist Level:  Moderate Assistance - Patient 50 - 74%    Lower Body Dressing/Undressing Lower body dressing      What is the patient wearing?: Incontinence brief, Pants     Lower body assist Assist for lower body dressing: Moderate Assistance - Patient 50 - 74%     Toileting Toileting    Toileting assist Assist for toileting: 2 Helpers     Transfers Chair/bed transfer  Transfers assist     Chair/bed transfer assist level: Minimal Assistance - Patient > 75%     Locomotion Ambulation   Ambulation assist      Assist level: Moderate Assistance - Patient 50 - 74% Assistive device: Walker-rolling Max distance: 30   Walk 10 feet activity   Assist     Assist level: Moderate Assistance - Patient - 50 - 74% Assistive device: Walker-rolling   Walk 50 feet activity   Assist Walk 50 feet with 2 turns activity did not  occur: Safety/medical concerns         Walk 150 feet activity   Assist Walk 150 feet activity did not occur: Safety/medical concerns         Walk 10 feet on uneven surface  activity   Assist Walk 10 feet on uneven surfaces activity did not occur: Safety/medical concerns         Wheelchair     Assist Is the patient using a wheelchair?: Yes Type of Wheelchair: Manual    Wheelchair assist level: Moderate Assistance - Patient 50 - 74% Max wheelchair distance: 50    Wheelchair 50 feet with 2 turns activity    Assist        Assist Level: Moderate Assistance - Patient 50 - 74%   Wheelchair 150 feet activity     Assist      Assist Level: Maximal Assistance - Patient 25 - 49%   Blood pressure 128/66, pulse 61, temperature 98 F (36.7 C), temperature source Oral, resp. rate 16, height 5' 5"$  (1.651 m), weight 76.4 kg, SpO2 100 %.  Medical Problem List and Plan: 1. Functional deficits secondary to right periventricular posterior frontal lobe infarct due to small vessel disease             -patient may shower             -ELOS/Goals: 7-10 days, min assist goals with PT, OT. Min to max with SLP. Pt needs to be more able to assist with care so that his wife can bring him home.  -CIR continue PT OT SLP  Discussed d/c Wednesday 2.  Antithrombotics: -DVT/anticoagulation:  Mechanical: Antiembolism stockings, thigh (TED hose) Bilateral lower extremities             -antiplatelet therapy: Aspirin 81 mg daily and Plavix 75 mg daily x 3 weeks then Plavix alone 3. Pain Management: Tylenol as needed 4. Mood/Behavior/Sleep: Aricept 5 mg nightly, Atarax 10 mg nightly as needed sleep             -antipsychotic agents: N/A             -keep sleep chart 5. Neuropsych/cognition: This patient is not capable of making decisions on his own behalf. 6. Skin/Wound Care: Routine skin checks 7. Fluids/Electrolytes/Nutrition: Routine in and outs with follow-up chemistries.              -po intake has generally been steady 8.  Hyperlipidemia.  Continue Crestor 20 mg daily 9.  Diabetes mellitus.  Hemoglobin A1c 10.1.  Currently on SSI.  Patient on Amaryl 1 mg daily, Glucophage 500 mg twice daily prior to admission.  Resume as needed. Provided list of foods that are good for diabetics  CBG (last 3)  Recent Labs    04/16/22 1707 04/16/22 2107 04/17/22 0622  GLUCAP 148* 112* 82     10.  Permissive hypertension.  Currently on no antihypertensive medication. BP reviewed and no need to resume medication. Patient on Cozaar 25 mg daily, Toprol-XL 25 mg daily and Imdur 30 mg daily prior to admission.  Resume as needed     04/17/2022    5:58 AM 04/16/2022    7:57 PM 04/16/2022    5:00 AM  Vitals with BMI  Weight   168 lbs 7 oz  BMI   [id number]  Systolic 0000000 [id number]   Diastolic 66 61   Pulse 61 58      11.  CKD stage III.  Last creatinine 1.48 on 2/9 follow-up chemistries on Monday 12.  Total blindness.  Resume eyedrops. 14. Screening for vitamin D deficiency: add on vitamin D level today      LOS: 3 days A FACE TO FACE EVALUATION WAS PERFORMED  Saxon Barich P Juliah Scadden 04/17/2022, 11:59 AM   `pmr

## 2022-04-17 NOTE — Progress Notes (Addendum)
Inpatient Rehabilitation Care Coordinator Assessment and Plan Patient Details  Name: Grant Jensen. MRN: [id number] Date of Birth: September 03, 1928  Today's Date: 04/17/2022  Hospital Problems: Principal Problem:   Small vessel cerebrovascular accident (CVA) [facility])  Past Medical History:  Past Medical History:  Diagnosis Date   Blindness    Dementia ([place])    Diabetes mellitus without complication ([place])    Glaucoma    Hypertension    Past Surgical History:  Past Surgical History:  Procedure Laterality Date   LEFT HEART CATHETERIZATION WITH CORONARY ANGIOGRAM N/A 06/22/2014   Procedure: LEFT HEART CATHETERIZATION WITH CORONARY ANGIOGRAM;  Surgeon: Adrian Prows, MD;  Location: [facility] CATH LAB;  Service: Cardiovascular;  Laterality: N/A;   Social History:  reports that he has never smoked. He does not have any smokeless tobacco history on file. He reports current alcohol use. He reports that he does not currently use drugs.  Family / Support Systems Marital Status: Married How Long?: 19 years (anniversary (3/1) Patient Roles: Spouse, Parent Spouse/Significant Other: [name] (wife) Children: one son Other Supports: None reported Anticipated Caregiver: Wife [name] Ability/Limitations of Caregiver: Wife can provide 24/7 care Caregiver Availability: 24/7 Family Dynamics: Pt lives with his wife.  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 Education: [place] - How often do you need to have someone help you when you read instructions, pamphlets, or other written material from your doctor or pharmacy?: Patient unable to respond Writes: Yes Employment Status: Retired Public relations account executive Issues: Pt wife denies Guardian/Conservator: N/A   Abuse/Neglect Abuse/Neglect Assessment Can Be Completed: Unable to  assess, patient is non-responsive or altered mental status  Patient response to: Social Isolation - How often do you feel lonely or isolated from those around you?: Patient unable to respond  Emotional Status Pt's affect, behavior and adjustment status: Pt in good spirits, as he is pleasant and confused. He is sitting with his pop it keeping his hands busy. Recent Psychosocial Issues: Pt wife reports he attends [facility] and they have reported depression. Psychiatric History: pt wife denies Substance Abuse History: Pt wife denies  Patient / Family Perceptions, Expectations & Goals Pt/Family understanding of illness & functional limitations: Pt wife has a general understandingof his care needs Premorbid pt/family roles/activities: Dependent due to dementia Anticipated changes in roles/activities/participation: no changes- continued support Pt/family expectations/goals: Patient's wife goal is for him to be able to get up and go to bathroom with assistance, and feels he has already met all these goals.  Community Resources [employer]: None Premorbid Home Care/DME Agencies: None Transportation available at discharge: Wife Is the patient able to respond to transportation needs?: Yes In the past 12 months, has lack of transportation kept you from medical appointments or from getting medications?: No In the past 12 months, has lack of transportation kept you from meetings, work, or from getting things needed for daily living?: No  Discharge Planning Living Arrangements: Spouse/significant other Support Systems: Spouse/significant other Type of Residence: Private residence Insurance underwriter Resources: Multimedia programmer (specify) ([employer] Medicare) Financial Resources: Radio broadcast assistant Screen Referred: No Living Expenses: Own Money Management: Spouse Does the patient have any problems obtaining your medications?: No Home Management: Pt wife manages all homecare  needs Patient/Family Preliminary Plans: N/A Care Coordinator Barriers to Discharge: Decreased caregiver support, Lack of/limited family support, Insurance for SNF coverage Care  Coordinator Anticipated Follow Up Needs: HH/OP Expected length of stay: 5-10 days  Clinical Impression  This SW covering for primary SW, [employer].   Pt wife completed assessment questions due to dementia. Pt is not a English as a second language teacher. No HCPOA. DNR on file. DME: access to rollator and 3in1 BSC.   Kanai Hilger A Georgeann Brinkman 04/17/2022, 3:29 PM

## 2022-04-17 NOTE — Discharge Summary (Signed)
Physician Discharge Summary  Patient ID: Grant Jensen. MRN: [id number] DOB/AGE: [birth year] 89 y.o.  Admit date: 04/14/2022 Discharge date: 04/19/2022  Discharge Diagnoses:  Principal Problem:   Small vessel cerebrovascular accident (CVA) ([place]) Mood stabilization Hyperlipidemia Diabetes mellitus Permissive hypertension CKD stage III Total blindness Nonischemic cardiomyopathy  Discharged Condition: Stable  Significant Diagnostic Studies: ECHOCARDIOGRAM COMPLETE  Result Date: 04/10/2022    ECHOCARDIOGRAM REPORT   Patient Name:   Grant Jensen. Date of Exam: 04/10/2022 Medical Rec #:  [id number]          Height:       67.0 in Accession #:    [id number]         Weight:       156.5 lb Date of Birth:  05/23/1928          BSA:          1.822 m Patient Age:    89 years           BP:           107/51 mmHg Patient Gender: M                  HR:           72 bpm. Exam Location:  Inpatient Procedure: 2D Echo, Cardiac Doppler and Color Doppler Indications:    Stroke I63.9  History:        Patient has no prior history of Echocardiogram examinations.                 Cardiomyopathy, Signs/Symptoms:Chest Pain; Risk                 Factors:Hypertension and Diabetes.  Sonographer:    Ronny Flurry Referring Phys: [id number] [place]  1. Left ventricular ejection fraction, by estimation, is 60 to 65%. The left ventricle has normal function. The left ventricle has no regional wall motion abnormalities. There is mild concentric left ventricular hypertrophy. Left ventricular diastolic parameters are consistent with Grade I diastolic dysfunction (impaired relaxation).  2. Right ventricular systolic function is normal. The right ventricular size is normal.  3. The mitral valve is degenerative. Mild mitral valve regurgitation. No evidence of mitral stenosis. Moderate mitral annular calcification.  4. The aortic valve is tricuspid. There is moderate calcification of the aortic valve. Aortic valve  regurgitation is not visualized. No aortic stenosis is present.  5. The inferior vena cava is normal in size with greater than 50% respiratory variability, suggesting right atrial pressure of 3 mmHg. FINDINGS  Left Ventricle: Left ventricular ejection fraction, by estimation, is 60 to 65%. The left ventricle has normal function. The left ventricle has no regional wall motion abnormalities. The left ventricular internal cavity size was normal in size. There is  mild concentric left ventricular hypertrophy. Left ventricular diastolic parameters are consistent with Grade I diastolic dysfunction (impaired relaxation). Right Ventricle: The right ventricular size is normal. No increase in right ventricular wall thickness. Right ventricular systolic function is normal. Left Atrium: Left atrial size was normal in size. Right Atrium: Right atrial size was normal in size. Pericardium: There is no evidence of pericardial effusion. Mitral Valve: The mitral valve is degenerative in appearance. There is mild calcification of the mitral valve leaflet(s). Moderate mitral annular calcification. Mild mitral valve regurgitation. No evidence of mitral valve stenosis. Tricuspid Valve: The tricuspid valve is normal in structure. Tricuspid valve regurgitation is trivial. No evidence of tricuspid stenosis. Aortic Valve: The aortic  valve is tricuspid. There is moderate calcification of the aortic valve. Aortic valve regurgitation is not visualized. No aortic stenosis is present. Aortic valve mean gradient measures 8.5 mmHg. Aortic valve peak gradient measures 15.1 mmHg. Aortic valve area, by VTI measures 2.12 cm. Pulmonic Valve: The pulmonic valve was normal in structure. Pulmonic valve regurgitation is trivial. No evidence of pulmonic stenosis. Aorta: The aortic root is normal in size and structure. Venous: The inferior vena cava was not well visualized. The inferior vena cava is normal in size with greater than 50% respiratory  variability, suggesting right atrial pressure of 3 mmHg. IAS/Shunts: No atrial level shunt detected by color flow Doppler.  LEFT VENTRICLE PLAX 2D LVIDd:         3.20 cm   Diastology LVIDs:         2.50 cm   LV e' medial:    4.90 cm/s LV PW:         1.50 cm   LV E/e' medial:  18.6 LV IVS:        0.90 cm   LV e' lateral:   6.53 cm/s LVOT diam:     2.00 cm   LV E/e' lateral: 14.0 LV SV:         85 LV SV Index:   46 LVOT Area:     3.14 cm  RIGHT VENTRICLE RV S prime:     5.66 cm/s TAPSE (M-mode): 1.2 cm LEFT ATRIUM             Index LA diam:        3.10 cm 1.70 cm/m LA Vol (A2C):   28.3 ml 15.53 ml/m LA Vol (A4C):   22.9 ml 12.57 ml/m LA Biplane Vol: 25.6 ml 14.05 ml/m  AORTIC VALVE AV Area (Vmax):    2.23 cm AV Area (Vmean):   2.14 cm AV Area (VTI):     2.12 cm AV Vmax:           194.00 cm/s AV Vmean:          133.500 cm/s AV VTI:            0.399 m AV Peak Grad:      15.1 mmHg AV Mean Grad:      8.5 mmHg LVOT Vmax:         138.00 cm/s LVOT Vmean:        90.900 cm/s LVOT VTI:          0.270 m LVOT/AV VTI ratio: 0.67  AORTA Ao Root diam: 3.40 cm Ao Asc diam:  3.40 cm MITRAL VALVE MV Area (PHT): 3.08 cm     SHUNTS MV Decel Time: 246 msec     Systemic VTI:  0.27 m MV E velocity: 91.20 cm/s   Systemic Diam: 2.00 cm MV A velocity: 162.00 cm/s MV E/A ratio:  0.56 [name] MD Electronically signed by [name] MD Signature Date/Time: 04/10/2022/4:51:44 PM    Final    EEG adult  Result Date: 04/09/2022 Derek Jack, MD     04/09/2022  7:49 PM Routine EEG Report Grant Jensen. is a 89 y.o. male with a history of spells and altered mental status who is undergoing an EEG to evaluate for seizures. Report: This EEG was acquired with electrodes placed according to the International 10-20 electrode system (including Fp1, Fp2, F3, F4, C3, C4, P3, P4, O1, O2, T3, T4, T5, T6, A1, A2, Fz, Cz, Pz). The following electrodes were missing or displaced:  none. The occipital dominant rhythm was 6-8 Hz. This  activity is reactive to stimulation. Drowsiness was manifested by background fragmentation; deeper stages of sleep were not identified. There was no focal slowing. There were no interictal epileptiform discharges. There were no electrographic seizures identified. Hyperventilation and photic stimulation were not performed. Impression and clinical correlation: This EEG was obtained while awake and drowsy and is abnormal due to mild diffuse slowing indicative of global cerebral dysfunction. Epileptiform abnormalities were not seen during this recording. Su Monks, MD [facility] [phone] If 7pm- 7am, please page neurology on call as listed in [place].   MR BRAIN WO CONTRAST  Result Date: 04/09/2022 CLINICAL DATA:  Left-sided weakness EXAM: MRI HEAD WITHOUT CONTRAST TECHNIQUE: Multiplanar, multiecho pulse sequences of the brain and surrounding structures were obtained without intravenous contrast. COMPARISON:  Same day CT stroke code and CTA head/neck angiogram. FINDINGS: Brain: Small regions of acute infarct in the periventricular right posterior frontal lobe/corona radiata (series 4, image 40). Evidence of hemorrhage. There is sequela of moderate chronic microvascular ischemic change. No hemorrhage. Advanced generalized volume loss. Vascular: Normal flow voids. Skull and upper cervical spine: Redemonstrated is mild narrowing at the craniocervical junction. Sinuses/Orbits: Bilateral lens replacement. Paranasal sinuses are clear. Other: None. IMPRESSION: Small foci of acute infarct in the right periventricular posterior frontal lobe/corona radiata. No hemorrhage or mass effect. Electronically Signed   By: Marin Roberts M.D.   On: 04/09/2022 15:26   DG Chest Portable 1 View  Result Date: 04/09/2022 CLINICAL DATA:  Rule out pneumonia. Right years. Slurred speech and mild left arm pain and left leg weakness. EXAM: PORTABLE CHEST 1 VIEW COMPARISON:  12/06/2021 FINDINGS: Stable cardiomediastinal  contours. No pleural effusion or edema. Chronic bronchitic changes are identified bilaterally. No superimposed airspace consolidation. Visualized osseous structures are unremarkable. IMPRESSION: 1. No acute findings. 2. Chronic bronchitic changes. Electronically Signed   By: Kerby Moors M.D.   On: 04/09/2022 13:17   CT ANGIO HEAD NECK W WO CM (CODE STROKE)  Result Date: 04/09/2022 CLINICAL DATA:  Slurred speech.  Left-sided weakness. EXAM: CT ANGIOGRAPHY HEAD AND NECK TECHNIQUE: Multidetector CT imaging of the head and neck was performed using the standard protocol during bolus administration of intravenous contrast. Multiplanar CT image reconstructions and MIPs were obtained to evaluate the vascular anatomy. Carotid stenosis measurements (when applicable) are obtained utilizing NASCET criteria, using the distal internal carotid diameter as the denominator. RADIATION DOSE REDUCTION: This exam was performed according to the departmental dose-optimization program which includes automated exposure control, adjustment of the mA and/or kV according to patient size and/or use of iterative reconstruction technique. CONTRAST:  75 ml Omni 350 COMPARISON:  CT Head 04/30/21 FINDINGS: CT HEAD FINDINGS Brain: No evidence of acute infarction, hemorrhage, hydrocephalus, extra-axial collection or mass lesion/mass effect. Is sequela of severe chronic microvascular ischemic change with advanced generalized volume loss. No evidence of hydrocephalus. Vascular: No hyperdense vessel or unexpected calcification. Skull: Normal. Negative for fracture or focal lesion. Sinuses/Orbits: No mastoid or middle ear effusion. Paranasal sinuses are clear. Bilateral lens replacement. Orbits are otherwise unremarkable. Other: None. Review of the MIP images confirms the above findings CTA NECK FINDINGS Aortic arch: Standard branching. Imaged portion shows no evidence of aneurysm or dissection. No significant stenosis of the major arch vessel  origins. Right carotid system: No evidence of dissection, stenosis (50% or greater), or occlusion. Mild atherosclerotic calcification at the carotid bifurcation Left carotid system: No evidence of dissection, stenosis (50% or greater), or occlusion. Vertebral arteries:  Moderate stenosis of the origin of the left vertebral artery secondary to calcified atherosclerotic plaque. Skeleton: There are degenerative changes at C1-C2 articulation where there is a large pannus and ligamentum flavum hypertrophy. This results in moderate spinal canal stenosis at the craniocervical junction. Other neck: Negative. Upper chest: Biapical bronchial wall thickening as can be seen in the setting of bronchitis or small airways disease. Review of the MIP images confirms the above findings CTA HEAD FINDINGS Anterior circulation: 1 of the proximal M2 branches on the right is likely occluded (series 7, image 99). ) Posterior circulation: Moderate focal stenosis in the P2 segment of the right PCA (series 11, image 77). Venous sinuses: As permitted by contrast timing, patent. Anatomic variants: None Review of the MIP images confirms the above findings IMPRESSION: 1. No hemorrhage or CT evidence of an acute infarct.  Aspects is 10. 2. One of the proximal M2 branches on the right is severely stenosed to nearly occluded. 3. Moderate focal stenosis in the P2 segment of the right PCA. 4. Moderate stenosis of the origin of the left vertebral artery. 5. Moderate spinal canal stenosis at the craniocervical junction due to ligamentum falvum hypertrophy and pannus. Findings were paged to Dr. Lorrin Goodell on 04/09/22 at 12:50 PM via [facility] paging system. Electronically Signed   By: Marin Roberts M.D.   On: 04/09/2022 12:57   CT HEAD CODE STROKE WO CONTRAST`  Result Date: 04/09/2022 CLINICAL DATA:  Slurred speech.  Left-sided weakness. EXAM: CT ANGIOGRAPHY HEAD AND NECK TECHNIQUE: Multidetector CT imaging of the head and neck was performed using the  standard protocol during bolus administration of intravenous contrast. Multiplanar CT image reconstructions and MIPs were obtained to evaluate the vascular anatomy. Carotid stenosis measurements (when applicable) are obtained utilizing NASCET criteria, using the distal internal carotid diameter as the denominator. RADIATION DOSE REDUCTION: This exam was performed according to the departmental dose-optimization program which includes automated exposure control, adjustment of the mA and/or kV according to patient size and/or use of iterative reconstruction technique. CONTRAST:  75 ml Omni 350 COMPARISON:  CT Head 04/30/21 FINDINGS: CT HEAD FINDINGS Brain: No evidence of acute infarction, hemorrhage, hydrocephalus, extra-axial collection or mass lesion/mass effect. Is sequela of severe chronic microvascular ischemic change with advanced generalized volume loss. No evidence of hydrocephalus. Vascular: No hyperdense vessel or unexpected calcification. Skull: Normal. Negative for fracture or focal lesion. Sinuses/Orbits: No mastoid or middle ear effusion. Paranasal sinuses are clear. Bilateral lens replacement. Orbits are otherwise unremarkable. Other: None. Review of the MIP images confirms the above findings CTA NECK FINDINGS Aortic arch: Standard branching. Imaged portion shows no evidence of aneurysm or dissection. No significant stenosis of the major arch vessel origins. Right carotid system: No evidence of dissection, stenosis (50% or greater), or occlusion. Mild atherosclerotic calcification at the carotid bifurcation Left carotid system: No evidence of dissection, stenosis (50% or greater), or occlusion. Vertebral arteries: Moderate stenosis of the origin of the left vertebral artery secondary to calcified atherosclerotic plaque. Skeleton: There are degenerative changes at C1-C2 articulation where there is a large pannus and ligamentum flavum hypertrophy. This results in moderate spinal canal stenosis at the  craniocervical junction. Other neck: Negative. Upper chest: Biapical bronchial wall thickening as can be seen in the setting of bronchitis or small airways disease. Review of the MIP images confirms the above findings CTA HEAD FINDINGS Anterior circulation: 1 of the proximal M2 branches on the right is likely occluded (series 7, image 99). ) Posterior circulation: Moderate focal stenosis  in the P2 segment of the right PCA (series 11, image 77). Venous sinuses: As permitted by contrast timing, patent. Anatomic variants: None Review of the MIP images confirms the above findings IMPRESSION: 1. No hemorrhage or CT evidence of an acute infarct.  Aspects is 10. 2. One of the proximal M2 branches on the right is severely stenosed to nearly occluded. 3. Moderate focal stenosis in the P2 segment of the right PCA. 4. Moderate stenosis of the origin of the left vertebral artery. 5. Moderate spinal canal stenosis at the craniocervical junction due to ligamentum falvum hypertrophy and pannus. Findings were paged to Dr. Lorrin Goodell on 04/09/22 at 12:50 PM via [facility] paging system. Electronically Signed   By: Marin Roberts M.D.   On: 04/09/2022 12:57    Labs:  Basic Metabolic Panel: Recent Labs  Lab 04/11/22 0609 04/13/22 0319 04/14/22 0616 04/17/22 0819  NA 136 136 138 136  K 4.0 4.2 4.4 4.1  CL 105 103 102 103  CO2 20* 23 22 23  $ GLUCOSE 131* 125* 110* 123*  BUN 14 17 16 16  $ CREATININE 1.50* 1.69* 1.48* 1.59*  CALCIUM 8.8* 9.0 9.2 9.5  MG 1.8 1.9 2.1  --     CBC: Recent Labs  Lab 04/11/22 0609 04/14/22 0442 04/17/22 0819  WBC 7.2 7.6 7.6  NEUTROABS  --   --  2.3  HGB 11.4* 12.2* 13.0  HCT 35.2* 39.0 41.1  MCV 78.0* 78.5* 77.8*  PLT 178 180 211    CBG: Recent Labs  Lab 04/16/22 1213 04/16/22 1707 04/16/22 2107 04/17/22 0622 04/17/22 1158  GLUCAP 137* 148* 112* 22 116*    Brief HPI:   Grant Jensen. is a 89 y.o. right-handed male with history of hypertension, CKD stage III, diabetes  mellitus with retinopathy blindness since 1994, nonischemic cardiomyopathy maintained on low-dose aspirin dementia maintained on Aricept.  Per chart review lives with spouse.  Attends adult daycare 3 days a week.  Required bilateral hand-held assist for mobility.  Presented 04/09/2022 with acute onset of left-sided weakness and slurred speech.  Cranial CT scan negative.  CT angiogram head and neck one of the proximal M2 branches on the right severely stenosed to nearly occluded.  Moderate focal stenosis in the P2 segment of the right PCA.  MRI showed small foci of acute infarction in the right periventricular posterior frontal lobe corona radiata.  No hemorrhage or mass effect.  EEG diffuse slowing no seizure.  Echocardiogram with ejection fraction of 60 to 65% no wall motion abnormalities grade 1 diastolic dysfunction.  Patient did not receive tPA.  Admission chemistries unremarkable except glucose 162 creatinine 1.64 hemoglobin A1c 10.1.  Neurology follow-up low-dose aspirin as well as Plavix x 3 weeks then Plavix alone for CVA prophylaxis.  Therapy evaluations completed due to patient decreased functional mobility left-sided weakness was admitted for a comprehensive rehab program.   Hospital Course: Taekwon Mischke. was admitted to rehab 04/14/2022 for inpatient therapies to consist of PT, ST and OT at least three hours five days a week. Past admission physiatrist, therapy team and rehab RN have worked together to provide customized collaborative inpatient rehab.  Pertaining to patient's right periventricular posterior frontal lobe infarction due to small vessel disease remained stable follow-up neurology services.  Low-dose aspirin and Plavix x 3 weeks then Plavix alone.  Mood stabilization maintained on Aricept as prior to admission.  Crestor ongoing for hyperlipidemia.  Diabetes mellitus hemoglobin A1c 10.1 maintained on Glucophage 500 mg daily  and Amaryl resumed 1 mg daily.  Blood pressure monitored  permissive hypertension patient had been on Cozaar 25 mg daily as well as Toprol 25 mg daily and Imdur 30 mg day prior to admission resume as needed.  CKD stage III latest creatinine 1.48-1.59.  Total blindness since 1994 related to glaucoma diabetic retinopathy maintained on eyedrops.   Blood pressures were monitored on TID basis and monitored  Diabetes has been monitored with ac/hs CBG checks and SSI was use prn for tighter BS control.    Rehab course: During patient's stay in rehab weekly team conferences were held to monitor patient's progress, set goals and discuss barriers to discharge. At admission, patient required max assist 30 feet rolling walker moderate assist stand pivot transfers  Physical exam.  Blood pressure 134/69 pulse 67 temperature 98.2 respirations 18 oxygen saturation is 100% Constitutional.  No acute distress HEENT Head.  Normocephalic and atraumatic Eyes.  Patient total blindness Neck.  Supple nontender no JVD without thyromegaly Cardiac regular rate and rhythm without any extra sounds or murmur heard Abdomen.  Soft nontender positive bowel sounds no rebound Respiratory effort normal no respiratory distress without wheeze Neurologic.  Alert sitting up in bed follows simple commands.  Oriented to person only.  Left facial droop.  Speech was slurred.  Right upper extremity 4/5 left upper extremity 4 -/5 right lower extremity 4/5 left lower extremity 3+ proximal to 4/distally.  DTRs 1+  He/She  has had improvement in activity tolerance, balance, postural control as well as ability to compensate for deficits. He/She has had improvement in functional use RUE/LUE  and RLE/LLE as well as improvement in awareness.  Wife completed tub shower transfers using both ETB and shower chair with use of ETB be highly recommended.  Patient count requires moderate assist for mobility secondary to muscle weakness.  Full family teaching completed plan discharge to home        Disposition: Discharge to home    Diet: Diabetic diet  Special Instructions: No smoking or alcohol  Medications at discharge 1.  Tylenol as needed 2.  Aspirin 81 mg p.o. daily until 04/30/2022 and stop 3.  Plavix 75 mg p.o. daily 4.  Aricept 5 mg p.o. daily 5.  Amaryl 1 mg p.o. daily 6.  Melatonin 3 mg nightly 7.Xalatan ophthalmic solution 0.005% 1 drop both eyes at bedtime 8.  Glucophage 500 mg p.o. daily 9.  Crestor 20 mg p.o. daily 10.  Ventolin inhaler 2 puffs every 4 hours as needed 11.  Imdur 30 mg p.o. daily 12.  Cozaar 25 mg p.o. daily 13.  Toprol-XL 25 mg p.o. daily 14.  Protonix 40 mg daily 15 Duoneb neb every 6 hour as needed  30-35 minutes were spent completing discharge summary and discharge planning Discharge Instructions     Ambulatory referral to Neurology   Complete by: As directed    An appointment is requested in approximately: Follow-up 4 weeks right periventricular parietal lobe CVA   Ambulatory referral to Physical Medicine Rehab   Complete by: As directed    Moderate complexity follow-up 1 to 2 weeks right periventricular CVA        Follow-up Information     Raulkar, Clide Deutscher, MD Follow up.   Specialty: Physical Medicine and Rehabilitation Why: Office to call for appointment Contact information: [id number] [address] [place] [address] [phone]                 Signed: Cathlyn Parsons 04/17/2022,  12:48 PM

## 2022-04-17 NOTE — IPOC Note (Signed)
Overall Plan of Care [facility]) Patient Details Name: Grant Jensen. MRN: [id number] DOB: 03-06-1929  Admitting Diagnosis: Small vessel cerebrovascular accident (CVA) [facility])  Hospital Problems: Principal Problem:   Small vessel cerebrovascular accident (CVA) ([place])     Functional Problem List: Nursing Bladder, Bowel, Safety, Endurance, Medication Management, Motor, Pain  PT Balance, Behavior, Endurance, Motor, Perception, Safety  OT Balance, Cognition, Endurance, Motor, Perception, Safety, Vision  SLP    TR         Basic ADL's: OT Eating, Bathing, Grooming, Dressing, Toileting     Advanced  ADL's: OT       Transfers: PT Bed Mobility  OT Toilet, Tub/Shower     Locomotion: PT Ambulation, Wheelchair Mobility     Additional Impairments: OT None  SLP        TR      Anticipated Outcomes Item Anticipated Outcome  Self Feeding Min A  Swallowing      Basic self-care  Min A  Toileting  Min A   Bathroom Transfers CGA/supervision  Bowel/Bladder  return to normal function prior to stroke  Transfers  Min assist with LRAD  Locomotion  min assist with LRAD  Communication     Cognition     Pain  less than 3  Safety/Judgment  remain fall free while in rehab   Therapy Plan: PT Intensity: Minimum of 1-2 x/day ,45 to 90 minutes PT Frequency: 5 out of 7 days PT Duration Estimated Length of Stay: 5-7 days OT Intensity: Minimum of 1-2 x/day, 45 to 90 minutes OT Frequency: 5 out of 7 days OT Duration/Estimated Length of Stay: 7-10 days     Team Interventions: Nursing Interventions Patient/Family Education, Pain Management, Dysphagia/Aspiration Precaution Training, Bladder Management, Medication Management, Discharge Planning, Bowel Management, Psychosocial Support, Disease Management/Prevention, Cognitive Remediation/Compensation  PT interventions Ambulation/gait training, Balance/vestibular training, Cognitive remediation/compensation, Disease management/prevention,  Discharge planning, Community reintegration, DME/adaptive equipment instruction, Functional electrical stimulation, Functional mobility training, Patient/family education, Pain management, Neuromuscular re-education, Psychosocial support, Skin care/wound management, Splinting/orthotics, Therapeutic Activities, UE/LE Strength taining/ROM, UE/LE Coordination activities, Stair training, Wheelchair propulsion/positioning, Therapeutic Exercise, Visual/perceptual remediation/compensation  OT Interventions Balance/vestibular training, Cognitive remediation/compensation, Discharge planning, Pain management, Self Care/advanced ADL retraining, Therapeutic Activities, UE/LE Coordination activities, Functional mobility training, Patient/family education, Therapeutic Exercise, Visual/perceptual remediation/compensation, Disease mangement/prevention, Neuromuscular re-education, UE/LE Strength taining/ROM, Wheelchair propulsion/positioning, DME/adaptive equipment instruction  SLP Interventions    TR Interventions    SW/CM Interventions     Barriers to Discharge MD  Medical stability  Nursing Decreased caregiver support, Home environment access/layout, Incontinence, Lack of/limited family support, Weight, Medication compliance, Behavior 1 level house with 3 ste and rail on left  PT Inaccessible home environment, Home environment Child psychotherapist, Insurance underwriter for SNF coverage    OT Home environment access/layout, Inaccessible home environment, Incontinence    SLP      SW       Team Discharge Planning: Destination: PT-Home ,OT- Home , SLP-  Projected Follow-up: PT-Home health PT, OT-  24 hour supervision/assistance, Home health OT, SLP-  Projected Equipment Needs: PT-Wheelchair cushion (measurements), Wheelchair (measurements), OT- To be determined, SLP-  Equipment Details: PT-pt has RW, OT-  Patient/family involved in discharge planning: PT- Patient, Family Midwife,  OT-Patient, Family member/caregiver,  SLP-   MD ELOS: 7-10 days Medical Rehab Prognosis:  Excellent Assessment: The patient has been admitted for CIR therapies with the diagnosis of right periventricular posterior frontal lobe infarct. The team will be addressing functional mobility, strength, stamina, balance, safety, adaptive techniques and equipment,  self-care, bowel and bladder mgt, patient and caregiver education. Goals have been set at supervision. Anticipated discharge destination is home.        See Team Conference Notes for weekly updates to the plan of care

## 2022-04-17 NOTE — Patient Care Conference (Signed)
Inpatient RehabilitationTeam Conference and Plan of Care Update Date: 04/18/2022   Time:07:41 AM    Patient Name: Grant Jensen.      Medical Record Number: [id number]  Date of Birth: [birth year] Sex: Male         Room/Bed: 4M05C/4M05C-01 Payor Info: Payor: Marine scientist / Plan: [facility] MEDICARE / Product Type: *No Product type* /    Admit Date/Time:  04/14/2022  8:07 PM  Primary Diagnosis:  Small vessel cerebrovascular accident (CVA) [facility])  Hospital Problems: Principal Problem:   Small vessel cerebrovascular accident (CVA) [facility])    Expected Discharge Date: Expected Discharge Date: 04/19/22  Team Members Present: Physician leading conference: Dr. Leeroy Cha Social Worker Present: Erlene Quan, BSW Nurse Present: Tacy Learn, RN PT Present: Becky Sax, PT OT Present: Jennefer Bravo, OT     Current Status/Progress Goal Weekly Team Focus  Bowel/Bladder     Incontinent B/B  Return to normal B/B function  Toilet every 4 hours and PRN    Swallow/Nutrition/ Hydration               ADL's   supervision UB dressing, Mod A LB dressing, max A bathing, max A toileting   Min A   DC planning, family ed, safety awareness    Mobility   bed mobility mod/max A, transfers with RW CGA/min A, gait 58f with RW min A, 4 steps 2 rails min A   min A overall, CGA sit<>stands and bed mobility  family education, D/C planning, DME, functional mobility/transfers, RW safety    Communication                Safety/Cognition/ Behavioral Observations               Pain     Denies pain  Remain pain free  Assess every 4 hours and PRN    Skin     Skin is CDI  Remain free of infection/breakdown  Assess every shift and PRN      Discharge Planning:  d/c tomorrow with spouse   Team Discussion: CVA. Bilateral blindness. Incontinent B/B. Denies pain. Skin is CDI. Patient meeting goals for discharge to home with wife. Patient dependent with all ADLs at  home.  Patient on target to meet rehab goals: yes, Supervision UB dressing. ModA LB dressing. MaxA bathing, MaxA toileting. Bed mobility Mod/MaxA. Transfers with RW CGA/minA. Gait 252 with RW minA, 4 steps bilateral rails minA.   *See Care Plan and progress notes for long and short-term goals.   Revisions to Treatment Plan:  NA  Teaching Needs: Medications, safety, self care, gait/transfer training, etc.   Current Barriers to Discharge: Decreased caregiver support  Possible Resolutions to Barriers: Wife at bedside assisting with therapies daily, nursing education, order recommended DME     Medical Summary Current Status: type 2 diabetes mellitus CKD, overweight, CVA  Barriers to Discharge: Medical stability  Barriers to Discharge Comments: type 2 diabetes mellitus, CKD, overweight, CVA Possible Resolutions to [employer]el[employer]: provided dietary education, discontinue insulin, monitoring creatining, monitoring CBGs, conitnue aspirin and Plavix   Continued Need for Acute Rehabilitation Level of Care: The patient requires daily medical management by a physician with specialized training in physical medicine and rehabilitation for the following reasons: Direction of a multidisciplinary physical rehabilitation program to maximize functional independence : Yes Medical management of patient stability for increased activity during participation in an intensive rehabilitation regime.: Yes Analysis of laboratory values and/or radiology reports with any subsequent need for  medication adjustment and/or medical intervention. : Yes   I attest that I was present, lead the team conference, and concur with the assessment and plan of the team.   Ernest Pine 04/18/2022, 5:25 PM

## 2022-04-17 NOTE — Care Management (Signed)
[facility] Individual Statement of Services  Patient Name:  Grant Jensen.  Date:  04/17/2022  Welcome to the [facility].  Our goal is to provide you with an individualized program based on your diagnosis and situation, designed to meet your specific needs.  With this comprehensive rehabilitation program, you will be expected to participate in at least 3 hours of rehabilitation therapies Monday-Friday, with modified therapy programming on the weekends.  Your rehabilitation program will include the following services:  Physical Therapy (PT), Occupational Therapy (OT), 24 hour per day rehabilitation nursing, Therapeutic Recreaction (TR), Psychology, Neuropsychology, Care Coordinator, Rehabilitation Medicine, [place], and Other  Weekly team conferences will be held on Wednesdays to discuss your progress.  Your Inpatient Rehabilitation Care Coordinator will talk with you frequently to get your input and to update you on team discussions.  Team conferences with you and your family in attendance may also be held.  Expected length of stay: 5-10    Overall anticipated outcome: Minimal Assistance  Depending on your progress and recovery, your program may change. Your Inpatient Rehabilitation Care Coordinator will coordinate services and will keep you informed of any changes. Your Inpatient Rehabilitation Care Coordinator's name and contact numbers are listed  below.  The following services may also be recommended but are not provided by the [facility] will be made to provide these services after discharge if needed.  Arrangements include referral to agencies that provide these services.  Your insurance has been verified to be:  [facility] Medicare  Your primary doctor is:  Engineer, building services PA  Pertinent information will be shared with your doctor and your insurance company.  Inpatient Rehabilitation Care Coordinator:  Erlene Quan, [place] or [phone]  Information discussed with and copy given to patient by: Rana Snare, 04/17/2022, 12:50 PM

## 2022-04-18 LAB — BASIC METABOLIC PANEL
Anion gap: 15 (ref 5–15)
BUN: 18 mg/dL (ref 8–23)
CO2: 19 mmol/L — ABNORMAL LOW (ref 22–32)
Calcium: 9.5 mg/dL (ref 8.9–10.3)
Chloride: 101 mmol/L (ref 98–111)
Creatinine, Ser: 1.51 mg/dL — ABNORMAL HIGH (ref 0.61–1.24)
GFR, Estimated: 43 mL/min — ABNORMAL LOW (ref >60)
Glucose, Bld: 103 mg/dL — ABNORMAL HIGH (ref 70–99)
Potassium: 3.9 mmol/L (ref 3.5–5.1)
Sodium: 135 mmol/L (ref 135–145)

## 2022-04-18 LAB — MISC LABCORP TEST (SEND OUT): Labcorp test code: 81950

## 2022-04-18 MED ORDER — ACETAMINOPHEN 325 MG PO TABS: 650.0000 mg | ORAL_TABLET | ORAL | Status: AC | PRN

## 2022-04-18 MED ORDER — METFORMIN HCL 500 MG PO TABS: 500.0000 mg | ORAL_TABLET | Freq: Every day | ORAL | 0 refills | Status: DC

## 2022-04-18 MED ORDER — ROSUVASTATIN CALCIUM 20 MG PO TABS: 20.0000 mg | ORAL_TABLET | Freq: Every day | ORAL | 0 refills | Status: AC

## 2022-04-18 MED ORDER — PANTOPRAZOLE SODIUM 40 MG PO TBEC: 40.0000 mg | DELAYED_RELEASE_TABLET | Freq: Every day | ORAL | 0 refills | Status: AC

## 2022-04-18 MED ORDER — MELATONIN 3 MG PO TABS: 3.0000 mg | ORAL_TABLET | Freq: Every day | ORAL | 0 refills | Status: DC

## 2022-04-18 MED ORDER — CLOPIDOGREL BISULFATE 75 MG PO TABS: 75.0000 mg | ORAL_TABLET | Freq: Every day | ORAL | 0 refills | Status: AC

## 2022-04-18 MED ORDER — GLIMEPIRIDE 1 MG PO TABS: 1.0000 mg | ORAL_TABLET | Freq: Every morning | ORAL | 0 refills | Status: AC

## 2022-04-18 NOTE — Progress Notes (Signed)
Physical Therapy Session Note  Patient Details  Name: Grant Jensen. MRN: [id number] Date of Birth: 1928-04-30  Today's Date: 04/18/2022 PT Individual Time: [id number] PT Individual Time Calculation (min): 68 min and 39 min  Short Term Goals: Week 1:  PT Short Term Goal 1 (Week 1): STG=LTG due to ELOS  Skilled Therapeutic Interventions/Progress Updates:   Treatment Session 1 Received pt sitting in [facility] with wife, [place], present for continued family education training. Session with emphasis on discharge planning, functional mobility/transfers, gait training, and stair navigation. [name] provided minimal assist for all transfers throughout session, although if given the time and cues for hand placement, pt able to stand with as little as CGA. Pt transported to/from room in [facility] dependently due to blindness. [name] then assisted to with stair navigation. Practiced standing from [facility] with RW and CGA and ambulating to staircase. Set RW aside and guided pt's hands to railings and pt navigated 4 6in steps with bilateral handrails and min A provided by [name]. Pt required cues to "step up/step down" and manual facilitation from [name] to slide hands up/down rail. [name] required min cues for body positioning behind pt when ascending stairs.   In ortho gym, pt stood with RW and CGA/min A provided by [facility] and ambulated 72f on uneven surfaces (ramp) with RW and min A to steer RW. [name] demonstrated good teach back of cues for proximity to RW and to "follow the walker". Transported to dayroom and performed seated BLE strengthening on Kinetron at 50 cm/sec for 1 minute x 4 trials with emphasis on glute/quad strength. [facility] and CGA and ambulated 513fwith RW and min A provided by [employer] [place] steer RW. [name] providing excellent cues to stay inside RW and to take larger steps while steering RW. Pt required multiple rest/water breaks throughout session. Pt then performed the following exercises  with emphasis on LE strength: -hip adduction ball squeezes 3x10 -LAQ with 2lb ankle weight 2x10 bilaterally -hip flexion with 2lb ankle weight 2x10 bilaterally Returned to room and concluded [facility] needs within reach and [name] present at bedside.   Treatment Session 2 [facility], [employer] Carbopresent for continued family education training. Session with emphasis on functional mobility/transfers, ambulation, and generalized strengthening and endurance. [facility]. [name] assisted with stand<>pivot transfer to/from Nustep with min A with assist to steer RW. Pt performed BUE/LE strengthening on Nustep for 9 minutes at workload 3 for a total of 471 steps with emphasis on cardiovascular endurance. [name] reported pt's BP higher today - returned to room and assessed: 144/74. Assessed cognition and pt A&O to self only - unaware of where he was or who is wife was. [facility] reach and [name] present at bedside.   Therapy Documentation Precautions:  Precautions Precautions: Fall, Other (comment) Precaution Comments: blind, dementia Restrictions Weight Bearing Restrictions: No  Therapy/Group: Individual Therapy AnAlfonse AlpersT, DPT  04/18/2022, 7:02 AM

## 2022-04-18 NOTE — Progress Notes (Signed)
PROGRESS NOTE   Subjective/Complaints: No new complaints this morning Wife is at bedside Discussed plan for d/c tomorrow Discussed that he would not need insulin tomorrow  Review of Systems  Constitutional:  Negative for chills, fever and malaise/fatigue.  Eyes:        Total blindness  Respiratory:  Negative for shortness of breath.   Cardiovascular:  Negative for chest pain.  Gastrointestinal:  Negative for abdominal pain.  Genitourinary: Negative.   Musculoskeletal:  Positive for joint pain.  Neurological:  Positive for speech change and weakness.    Objective:   No results found. Recent Labs    04/17/22 0819  WBC 7.6  HGB 13.0  HCT 41.1  PLT 211    Recent Labs    04/17/22 0819  NA 136  K 4.1  CL 103  CO2 23  GLUCOSE 123*  BUN 16  CREATININE 1.59*  CALCIUM 9.5     Intake/Output Summary (Last 24 hours) at 04/18/2022 1326 Last data filed at 04/18/2022 1300 Gross per 24 hour  Intake 837 ml  Output --  Net 837 ml         Physical Exam: Vital Signs Blood pressure (!) 156/69, pulse 69, temperature 98.3 F (36.8 C), resp. rate 18, height 5' 5"$  (1.651 m), weight 76.4 kg, SpO2 100 %.   General: Alert and oriented x 3, in bed, NAD, BMI 28.03 HEENT: Head is normocephalic, atraumatic, MMM, blind Neck: Supple without JVD or lymphadenopathy Heart: Reg rate and rhythm. No murmurs rubs or gallops Chest: CTA bilaterally without wheezes, rales, or rhonchi; no distress Abdomen: Soft, non-tender, non-distended, bowel sounds positive. Extremities: No clubbing, cyanosis, or edema. Pulses are 2+ Skin: warm and dry Musculoskeletal:        General: No swelling or tenderness.     Cervical back: Normal range of motion.     Comments: No pain with ROM or palpation  Neurological:     Mental Status: He is alert.     Comments: Patient alert and oriented to self only, sitting up in bed.  Follows simple commands.    Blind both eyes. Left facial droop. Speech slurred.  HOH, Moves right side preferentially over left. RUE 4/5. LUE 4-/5. RLE 4/5 LLE 3+ prox to 4/5 distally.  Feet hypersensitive to touch. No abnl tone  Psychiatric:     Comments: cooperative and pleasant   Assessment/Plan: 1. Functional deficits which require 3+ hours per day of interdisciplinary therapy in a comprehensive inpatient rehab setting. Physiatrist is providing close team supervision and 24 hour management of active medical problems listed below. Physiatrist and rehab team continue to assess barriers to discharge/monitor patient progress toward functional and medical goals  Care Tool:  Bathing    Body parts bathed by patient: Right arm, Left arm, Chest, Abdomen, Right upper leg, Left upper leg, Face, Front perineal area   Body parts bathed by helper: Right lower leg, Left lower leg, Buttocks     Bathing assist Assist Level: Moderate Assistance - Patient 50 - 74%     Upper Body Dressing/Undressing Upper body dressing   What is the patient wearing?: Pull over shirt    Upper body assist Assist  Level: Minimal Assistance - Patient > 75%    Lower Body Dressing/Undressing Lower body dressing      What is the patient wearing?: Incontinence brief, Pants     Lower body assist Assist for lower body dressing: Maximal Assistance - Patient 25 - 49%     Toileting Toileting    Toileting assist Assist for toileting: Moderate Assistance - Patient 50 - 74%     Transfers Chair/bed transfer  Transfers assist     Chair/bed transfer assist level: Minimal Assistance - Patient > 75%     Locomotion Ambulation   Ambulation assist      Assist level: Minimal Assistance - Patient > 75% Assistive device: Walker-rolling Max distance: 34f   Walk 10 feet activity   Assist     Assist level: Minimal Assistance - Patient > 75% Assistive device: Walker-rolling   Walk 50 feet activity   Assist Walk 50 feet with 2  turns activity did not occur: Safety/medical concerns  Assist level: Minimal Assistance - Patient > 75% Assistive device: Walker-rolling    Walk 150 feet activity   Assist Walk 150 feet activity did not occur: Safety/medical concerns (fatigue, weakness/deconditioning)         Walk 10 feet on uneven surface  activity   Assist Walk 10 feet on uneven surfaces activity did not occur: Safety/medical concerns   Assist level: Minimal Assistance - Patient > 75% Assistive device: Walker-rolling   Wheelchair     Assist Is the patient using a wheelchair?: Yes Type of Wheelchair: Manual    Wheelchair assist level: Moderate Assistance - Patient 50 - 74% Max wheelchair distance: 50    Wheelchair 50 feet with 2 turns activity    Assist        Assist Level: Moderate Assistance - Patient 50 - 74%   Wheelchair 150 feet activity     Assist      Assist Level: Maximal Assistance - Patient 25 - 49%   Blood pressure (!) 156/69, pulse 69, temperature 98.3 F (36.8 C), resp. rate 18, height 5' 5"$  (1.651 m), weight 76.4 kg, SpO2 100 %.  Medical Problem List and Plan: 1. Functional deficits secondary to right periventricular posterior frontal lobe infarct due to small vessel disease             -patient may shower             -ELOS/Goals: 7-10 days, min assist goals with PT, OT. Min to max with SLP. Pt needs to be more able to assist with care so that his wife can bring him home.  Continue CIR PT OT SLP  Discussed d/c Wednesday 2.  Antithrombotics: -DVT/anticoagulation:  Mechanical: Antiembolism stockings, thigh (TED hose) Bilateral lower extremities             -antiplatelet therapy: Aspirin 81 mg daily and Plavix 75 mg daily x 3 weeks then Plavix alone 3. Pain Management: Tylenol as needed 4. Mood/Behavior/Sleep: Aricept 5 mg nightly, Atarax 10 mg nightly as needed sleep             -antipsychotic agents: N/A             -keep sleep chart 5. Neuropsych/cognition:  This patient is not capable of making decisions on his own behalf. 6. Skin/Wound Care: Routine skin checks 7. Fluids/Electrolytes/Nutrition: Routine in and outs with follow-up chemistries.             -po intake has generally been steady 8.  Hyperlipidemia.  Continue Crestor  20 mg daily 9.  Diabetes mellitus.  Hemoglobin A1c 10.1.  Currently on SSI.  Patient on Amaryl 1 mg daily, Glucophage 500 mg twice daily prior to admission.  Resume as needed. Provided list of foods that are good for diabetics  CBG (last 3)  Recent Labs    04/17/22 0622 04/17/22 1158 04/17/22 1646  GLUCAP 82 116* 127*     10.  Permissive hypertension.  Currently on no antihypertensive medication. BP reviewed and no need to resume medication. Patient on Cozaar 25 mg daily, Toprol-XL 25 mg daily and Imdur 30 mg daily prior to admission.  Resume as needed     04/18/2022    1:08 PM 04/18/2022    3:53 AM 04/17/2022    8:13 PM  Vitals with BMI  Systolic [id number]  [id number]  Diastolic 69  74  Pulse 69 86 73     11.  CKD stage III.  Check creatinine today 12.  Total blindness.  Continue eyedrops. 83. Screening for vitamin D deficiency: check level today      LOS: 4 days A FACE TO FACE EVALUATION WAS PERFORMED  Grant Jensen P Shahrukh Pasch 04/18/2022, 1:26 PM   `pmr

## 2022-04-18 NOTE — Plan of Care (Signed)
  Problem: RH Eating Goal: LTG Patient will perform eating w/assist, cues/equip (OT) Description: LTG: Patient will perform eating with assist, with/without cues using equipment (OT) Outcome: Not Met (add Reason) Flowsheets (Taken 04/18/2022 1211) LTG: Pt will perform eating with assistance level of: (Not met due to impaired cognition/vision with poor carry over, and balance deficits requiring greater assist) --   Problem: RH Bathing Goal: LTG Patient will bathe all body parts with assist levels (OT) Description: LTG: Patient will bathe all body parts with assist levels (OT) Outcome: Not Met (add Reason) Flowsheets (Taken 04/18/2022 1211) LTG: Pt will perform bathing with assistance level/cueing: (Not met due to impaired cognition/vision with poor carry over, and balance deficits requiring greater assist) --   Problem: RH Dressing Goal: LTG Patient will perform upper body dressing (OT) Description: LTG Patient will perform upper body dressing with assist, with/without cues (OT). Outcome: Not Met (add Reason) Flowsheets (Taken 04/18/2022 1211) LTG: Pt will perform upper body dressing with assistance level of: (Not met due to impaired cognition/vision with poor carry over, and balance deficits requiring greater assist) -- Goal: LTG Patient will perform lower body dressing w/assist (OT) Description: LTG: Patient will perform lower body dressing with assist, with/without cues in positioning using equipment (OT) Outcome: Not Met (add Reason) Flowsheets (Taken 04/18/2022 1211) LTG: Pt will perform lower body dressing with assistance level of: (Not met due to impaired cognition/vision with poor carry over, and balance deficits requiring greater assist) --   Problem: RH Toilet Transfers Goal: LTG Patient will perform toilet transfers w/assist (OT) Description: LTG: Patient will perform toilet transfers with assist, with/without cues using equipment (OT) Outcome: Not Met (add Reason) Flowsheets  (Taken 04/18/2022 1211) LTG: Pt will perform toilet transfers with assistance level of: (Not met due to impaired cognition/vision with poor carry over, and balance deficits requiring greater assist) --

## 2022-04-18 NOTE — Progress Notes (Signed)
Inpatient Rehabilitation Care Coordinator Discharge Note   Patient Details  Name: Grant Jensen. MRN: [id number] Date of Birth: Sep 20, 1928   Discharge location: Home with spouse  Length of Stay: 5 Days  Discharge activity level: Supervision  Home/community participation: Spouse  Patient response [id number] Literacy - How often do you need to have someone help you when you read instructions, pamphlets, or other written material from your doctor or pharmacy?: Often  Patient response [id number] Isolation - How often do you feel lonely or isolated from those around you?: Patient unable to respond  Services provided included: SW, Pharmacy, TR, CM, RN, SLP, OT, PT, RD, MD  Financial Services:  Financial Services Utilized: Josephine offered to/list presented to: Spouse and patient  Follow-up services arranged:  Home Health, DME Home Health Agency: [facility]    DME : Hospital Bed, Wheelchair and Rolling Walk    Patient response to transportation need: Is the patient able to respond to transportation needs?: No In the past 12 months, has lack of transportation kept you from medical appointments or from getting medications?: No In the past 12 months, has lack of transportation kept you from meetings, work, or from getting things needed for daily living?: No    Comments (or additional information):  Patient/Family verbalized understanding of follow-up arrangements:  Yes  Individual responsible for coordination of the follow-up plan: Anne Shutter [id number]  Confirmed correct DME delivered: Dyanne Iha 04/18/2022    Dyanne Iha

## 2022-04-18 NOTE — Progress Notes (Addendum)
Patient ID: Grant Jensen., male   DOB: 1928-03-14, 89 y.o.   MRN: [id number]  SW received request from patient's spouse to remove the [facility] from DME order. Spouse also requesting to have HB delivered on Saturday, SW reached out to [employer]. Spouse reports that patient will be able to sleep in their bed until Saturday. Spouse will have assistance with removing their bed on Friday. No additional questions or concerns.  [facility] referral sent to [facility]

## 2022-04-18 NOTE — Progress Notes (Signed)
Physical Therapy Discharge Summary  Patient Details  Name: Grant Jensen. MRN: [id number] Date of Birth: 07/20/1928  Date of Discharge from PT service:April 18, 2022  Patient has met 5 of 9 long term goals due to improved activity tolerance, improved balance, and improved caregiver education. Patient to discharge at a wheelchair level [place].  Patient's care partner is independent to provide the necessary physical and cognitive assistance at discharge. Pt's wife, [place] Carbo, has been present daily and participated in continuous hands on caregiver training sessions and has verbalized and demonstrated confidence with tasks to ensure safe discharge home. Plan to get ramp installed upon D/C.   Reasons goals not met: Pt did not meet dynamic sitting and standing balance goals of supervision as pt currently requires CGA due to blindness. Pt did not meet bed mobility goal of CGA as pt currently requires mod A overall due to poor sequencing, weakness, and blindness. Pt did not meet car transfer goal of min A as pt current requires mod A due to weakness and poor sequencing.   Recommendation:  Patient will benefit from ongoing skilled PT services in home health setting to continue to advance safe functional mobility, address ongoing impairments in transfers, generalized strengthening and endurance, dynamic standing balance/coordination, gait training, stair navigation, and to minimize fall risk.  Equipment: 18x18 manual WC, RW, hospital bed  Reasons for discharge: treatment goals met and discharge from hospital  Patient/family agrees with progress made and goals achieved: Yes  PT Discharge Precautions/Restrictions Precautions Precautions: Fall;Other (comment) Precaution Comments: blind, dementia, HOH Restrictions Weight Bearing Restrictions: No Pain Interference Pain Interference Pain Effect on Sleep: 0. Does not apply - I have not had any pain or hurting in the past 5 days Pain  Interference with Therapy Activities: 1. Rarely or not at all Pain Interference with Day-to-Day Activities: 1. Rarely or not at all Cognition Overall Cognitive Status: History of cognitive impairments - at baseline Arousal/Alertness: Awake/alert Orientation Level: Oriented to person Memory: Impaired Awareness: Impaired Problem Solving: Impaired Safety/Judgment: Impaired Comments: hx of dementia Sensation Sensation Light Touch: Appears Intact Proprioception: Appears Intact Coordination Gross Motor Movements are Fluid and Coordinated: No Fine Motor Movements are Fluid and Coordinated: No Coordination and Movement Description: generalized incoordination due to blindness, impaired perception, and balance deficits Finger Nose Finger Test: unable to formally test due to blindness Heel Shin Test: unable to follow instructions due to dementia and blindness Motor  Motor Motor: Hemiplegia Motor - Skilled Clinical Observations: mild L hemiplegia  Mobility Bed Mobility Bed Mobility: Rolling Right;Rolling Left;Sit to Supine;Supine to Sit Rolling Right: Minimal Assistance - Patient > 75% Rolling Left: Minimal Assistance - Patient > 75% Supine to Sit: Moderate Assistance - Patient 50-74% Sit to Supine: Maximal Assistance - Patient 25-49% Transfers Transfers: Sit to Stand;Stand Pivot Transfers;Stand to Sit Sit to Stand: Contact Guard/Touching assist Stand to Sit: Contact Guard/Touching assist Stand Pivot Transfers: Minimal Assistance - Patient > 75% Stand Pivot Transfer Details: Verbal cues for sequencing;Tactile cues for sequencing;Verbal cues for precautions/safety;Verbal cues for safe use of DME/AE Stand Pivot Transfer Details (indicate cue type and reason): min A to steer RW with cues for pt to "follow the walker" Transfer (Assistive device): Rolling walker Locomotion  Gait Ambulation: Yes Gait Assistance: Minimal Assistance - Patient > 75% Gait Distance (Feet): 50 Feet Assistive  device: Rolling walker Gait Assistance Details: Verbal cues for precautions/safety;Verbal cues for safe use of DME/AE;Verbal cues for gait pattern;Tactile cues for sequencing Gait Assistance Details: cues for proximity to  RW and assist to steer RW Gait Gait: Yes Gait Pattern: Impaired Gait Pattern: Decreased stride length;Shuffle;Trunk flexed Gait velocity: decreased Stairs / Additional Locomotion Stairs: Yes Stairs Assistance: Minimal Assistance - Patient > 75% Stair Management Technique: Two rails Number of Stairs: 4 Height of Stairs: 6 Ramp: Minimal Assistance - Patient >75% (RW) Pick up small object from the floor assist level: Dependent - Patient 0% Wheelchair Mobility Wheelchair Mobility: Yes Wheelchair Assistance: Moderate Assistance - Patient 50 - 74% Wheelchair Propulsion: Both upper extremities Wheelchair Parts Management: Needs assistance Distance: 50  Trunk/Postural Assessment  Cervical Assessment Cervical Assessment: Exceptions to [facility] (forward head) Thoracic Assessment Thoracic Assessment: Exceptions to [facility] (thoracic rounding) Lumbar Assessment Lumbar Assessment: Exceptions to [facility] (posterior pelvic tilt) Postural Control Postural Control: Deficits on evaluation Protective Responses: delayed due to blindness  Balance Balance Balance Assessed: Yes Static Sitting Balance Static Sitting - Balance Support: Feet supported;No upper extremity supported Static Sitting - Level of Assistance: 5: Stand by assistance (supervision) Dynamic Sitting Balance Dynamic Sitting - Balance Support: Feet supported;No upper extremity supported Dynamic Sitting - Level of Assistance: 5: Stand by assistance (CGA) Static Standing Balance Static Standing - Balance Support: Bilateral upper extremity supported;During functional activity (RW) Static Standing - Level of Assistance: 5: Stand by assistance (CGA) Dynamic Standing Balance Dynamic Standing - Balance Support: Bilateral upper  extremity supported;During functional activity (RW) Dynamic Standing - Level of Assistance: 4: Min assist Dynamic Standing - Comments: with transfers and gait Extremity Assessment  RLE Assessment RLE Assessment: Exceptions to [facility] General Strength Comments: grossly 4+/5 LLE Assessment LLE Assessment: Exceptions to [facility] General Strength Comments: grossly 4/5   Alfonse Alpers PT, DPT  04/18/2022, 7:10 AM

## 2022-04-18 NOTE — Progress Notes (Signed)
Occupational Therapy Session Note  Patient Details  Name: Grant Jensen. MRN: [id number] Date of Birth: 06-01-1928  Today's Date: 04/18/2022 OT Individual Time: 0805-0900 & [id number] OT Individual Time Calculation (min): 55 min & 25 min   Short Term Goals: Week 1:  OT Short Term Goal 1 (Week 1): STG = LTG due to ELOS  Skilled Therapeutic Interventions/Progress Updates:  Session 1 Skilled OT intervention completed with focus on family education regarding showers, shower transfers and ADLs in prep for DC. Pt received seated in recliner, agreeable to session. No pain reported.  Wife demonstrated the ability to assist pt with the following: -All sit > stands with CGA/supervision using RW and min A ambulatory transfers with wife guiding the RW for pt and pt following due to blindness at baseline -Shower transfer with CGA to shower chair -Mod A bathing at the sit > stand level for peri-area. Anticipate pt could do more however is not thorough and wife has been dependently providing assist for several years -UB dressing with supervision, LB dressing with mod A, socks with total A  OT made adjustments to pt's personal [name] that was delivered including leg rests, cushion etc. Education provided on break extenders and purpose, w/c cushion hygiene and care, and features including arm rest that flips up.  Discussed recommendation of hospital bed to decrease caregiver burden and for prevention of falls for pt at DC. Therefore discussed hospital bed features and what a home hospital bed would look like and entail including mattress protector, hand rails, adjustable features, and locks on the wheels. Discussed how she can place lower bed rail on the bed for preventing night time out of bed mobility for safety as she prefers all 4 rails up in hospital however advised that she can't adjust the bed with it on. Pt remained seated in w/c with wife present and all needs in reach at end of session.  Session  2 Skilled OT intervention completed with focus on education with wife and BUE exercises. Pt received seated in w/c, agreeable to session. No pain reported.  Wife present, with review on shower session, with suggestions made about avoidance of having pt stand in shower at home since there is no grab bar for washing of peri-areas, as well as recommendation for dry feet and something on his feet if walking out of shower on bare tile.  Issued pt theraputty to address fine motor/gross grasping skills on LUE as well as to manage pt's busy hands at home. Also issued pt red theraband for BUE exercises with max multimodal cues needed to complete the following: -(x10 each arm) chest press, bicep curls, scapular retraction, shoulder flexion, shoulder horizontal abduction  Pt remained seated in w/c, with wife present, and with all needs in reach at end of session.   Therapy Documentation Precautions:  Precautions Precautions: Fall, Other (comment) Precaution Comments: blind, dementia, HOH Restrictions Weight Bearing Restrictions: No    Therapy/Group: Individual Therapy  Blase Mess, MS, OTR/L  04/18/2022, 12:06 PM

## 2022-04-18 NOTE — Progress Notes (Signed)
Occupational Therapy Discharge Summary  Patient Details  Name: Grant Jensen. MRN: [id number] Date of Birth: November 23, 1928  Date of Discharge from OT service:April 18, 2022  Patient has met 6 of 11 long term goals due to improved balance, ability to compensate for deficits, and functional use of  LEFT upper and LEFT lower extremity.  Patient to discharge at overall Mod Assist level. Patient's wife is independent to provide the necessary physical and cognitive assistance at discharge. Pt's wife was assisting pt physically and cognitively PTA, with pt at an overall mod-max A level for all self-care needs. She verbalized that pt is at his baseline functionally, requesting him to DC sooner then ELOS. She demonstrated during multiple OT sessions her ability to care for pt at his CLOF.   Reasons goals not met: Eating, bathing, UB/LB dressing and toilet transfer goals not met at CGA/min A level due to shortened LOS due to pt's baseline cognitive/visual impairment status. Pt's wife provided up to max A for all of these for pt several years PTA therefore unable to progress pt further due to dependence on current level of assist/routine  Recommendation:  Patient will benefit from ongoing skilled OT services in home health setting to continue to advance functional skills in the area of BADL and Reduce care partner burden.  Equipment: Has all DME needed  Reasons for discharge: treatment goals met and discharge from hospital  Patient/family agrees with progress made and goals achieved: Yes  OT Discharge Precautions/Restrictions  Precautions Precautions: Fall;Other (comment) Precaution Comments: blind, dementia, HOH Restrictions Weight Bearing Restrictions: No ADL ADL Eating: Moderate assistance Where Assessed-Eating: Wheelchair Grooming: Maximal cueing, Minimal assistance Where Assessed-Grooming: Wheelchair Upper Body Bathing: Supervision/safety, Maximal cueing Where Assessed-Upper Body  Bathing: Shower Lower Body Bathing: Minimal assistance, Maximal cueing Where Assessed-Lower Body Bathing: Shower Upper Body Dressing: Minimal assistance, Maximal cueing Where Assessed-Upper Body Dressing: Edge of bed Lower Body Dressing: Moderate assistance, Maximal cueing Where Assessed-Lower Body Dressing: Edge of bed Toileting: Moderate assistance Where Assessed-Toileting: Glass blower/designer: Psychiatric nurse Method: Counselling psychologist: Raised toilet seat, Grab bars Tub/Shower Transfer: Maximal cueing, Minimal assistance Tub/Shower Transfer Method: Ambulating, Sit pivot Tub/Shower Equipment: Facilities manager: Minimal assistance Social research officer, government Method: Print production planner with back, Grab bars Vision Baseline Vision/History: 2 Legally blind Patient Visual Report: No change from baseline Vision Assessment?: Vision impaired- to be further tested in functional context Additional Comments: baseline blindness > 2 years Perception  Perception: Impaired Praxis Praxis: Impaired Praxis Impairment Details: Perseveration;Initiation Cognition Cognition Overall Cognitive Status: History of cognitive impairments - at baseline Arousal/Alertness: Awake/alert Orientation Level: Person;Place;Situation Person: Oriented Place: Disoriented Situation: Disoriented Memory: Impaired Awareness: Impaired Problem Solving: Impaired Safety/Judgment: Impaired Comments: hx of dementia Brief Interview for Mental Status (BIMS) Repetition of Three Words (First Attempt): 2 Temporal Orientation: Year: Nonsensical Temporal Orientation: Month: Nonsensical Temporal Orientation: Day: Incorrect Recall: "Sock": No, could not recall Recall: "Blue": No, could not recall Recall: "Bed": No, could not recall BIMS Summary Score: 2 Sensation Sensation Light Touch: Appears Intact Proprioception: Appears  Intact Coordination Gross Motor Movements are Fluid and Coordinated: No Fine Motor Movements are Fluid and Coordinated: No Coordination and Movement Description: generalized incoordination due to blindness, impaired perception, and balance deficits Finger Nose Finger Test: unable to formally test due to blindness Heel Shin Test: unable to follow instructions due to dementia and blindness Motor  Motor Motor: Hemiplegia Motor - Skilled Clinical Observations: mild L hemiplegia Mobility  Bed Mobility Bed Mobility: Rolling Right;Rolling Left;Sit to Supine;Supine to Sit Rolling Right: Minimal Assistance - Patient > 75% Rolling Left: Minimal Assistance - Patient > 75% Supine to Sit: Moderate Assistance - Patient 50-74% Sit to Supine: Maximal Assistance - Patient 25-49% Transfers Sit to Stand: Contact Guard/Touching assist Stand to Sit: Contact Guard/Touching assist  Trunk/Postural Assessment  Cervical Assessment Cervical Assessment: Exceptions to [facility] (forward head) Thoracic Assessment Thoracic Assessment: Exceptions to [facility] (thoracic rounding) Lumbar Assessment Lumbar Assessment: Exceptions to [facility] (posterior pelvic tilt) Postural Control Postural Control: Deficits on evaluation Protective Responses: delayed due to blindness  Balance Balance Balance Assessed: Yes Static Sitting Balance Static Sitting - Balance Support: Feet supported;No upper extremity supported Static Sitting - Level of Assistance: 5: Stand by assistance (supervision) Dynamic Sitting Balance Dynamic Sitting - Balance Support: Feet supported;No upper extremity supported Dynamic Sitting - Level of Assistance: 5: Stand by assistance (CGA) Static Standing Balance Static Standing - Balance Support: Bilateral upper extremity supported;During functional activity (RW) Static Standing - Level of Assistance: 5: Stand by assistance (CGA) Dynamic Standing Balance Dynamic Standing - Balance Support: Bilateral upper extremity  supported;During functional activity (RW) Dynamic Standing - Level of Assistance: 4: Min assist Dynamic Standing - Comments: with transfers and gait Extremity/Trunk Assessment RUE Assessment RUE Assessment: Within Functional Limits LUE Assessment LUE Assessment: Exceptions to [facility] Active Range of Motion (AROM) Comments: 120 degrees shoulder flexion, WFL distally General Strength Comments: 3+/5 proximally, WFL distally   Caidyn Blossom E Destani Wamser, MS, OTR/L  04/18/2022, 12:07 PM

## 2022-04-19 LAB — MISC LABCORP TEST (SEND OUT): Labcorp test code: 81950

## 2022-04-19 LAB — GLUCOSE, CAPILLARY: Glucose-Capillary: 101 mg/dL — ABNORMAL HIGH (ref 70–99)

## 2022-04-19 MED ORDER — ISOSORBIDE MONONITRATE ER 30 MG PO TB24: 30.0000 mg | ORAL_TABLET | Freq: Every day | ORAL | 0 refills | Status: AC

## 2022-04-19 NOTE — Progress Notes (Signed)
During vitals rounds this AM pt's initial O2 reading was 78%.  Pt was arousable and moving around.  Sat pt up in bed and O2 went up to 100.  Pt able to follow commands.  CBG 101.  Charge nurse made aware and she came to assist with assessing pt.

## 2022-04-19 NOTE — Progress Notes (Signed)
PROGRESS NOTE   Subjective/Complaints: No new complaints this morning Wife feels ready to take him home today Discussed restarting imdur, restarting remaining cardiac medications after consultation with PCP  Review of Systems  Constitutional:  Negative for chills, fever and malaise/fatigue.  Eyes:        Total blindness  Respiratory:  Negative for shortness of breath.   Cardiovascular:  Negative for chest pain.  Gastrointestinal:  Negative for abdominal pain.  Genitourinary: Negative.   Musculoskeletal:  Positive for joint pain.  Neurological:  Positive for speech change and weakness.  Denies constipation  Objective:   No results found. Recent Labs    04/17/22 0819  WBC 7.6  HGB 13.0  HCT 41.1  PLT 211    Recent Labs    04/17/22 0819 04/18/22 1402  NA 136 135  K 4.1 3.9  CL 103 101  CO2 23 19*  GLUCOSE 123* 103*  BUN 16 18  CREATININE 1.59* 1.51*  CALCIUM 9.5 9.5     Intake/Output Summary (Last 24 hours) at 04/19/2022 1006 Last data filed at 04/18/2022 1730 Gross per 24 hour  Intake 360 ml  Output --  Net 360 ml         Physical Exam: Vital Signs Blood pressure (!) 146/63, pulse (!) 57, temperature (!) 97.5 F (36.4 C), temperature source Oral, resp. rate 16, height 5' 5"$  (1.651 m), weight 76.4 kg, SpO2 100 %.   General: Alert and oriented x 3, in bed, NAD, BMI 28.03 HEENT: Head is normocephalic, atraumatic, MMM, blind Neck: Supple without JVD or lymphadenopathy Heart: Reg rate and rhythm. No murmurs rubs or gallops Chest: CTA bilaterally without wheezes, rales, or rhonchi; no distress Abdomen: Soft, non-tender, non-distended, bowel sounds positive. Extremities: No clubbing, cyanosis, or edema. Pulses are 2+ Skin: warm and dry Musculoskeletal:        General: No swelling or tenderness.     Cervical back: Normal range of motion.     Comments: No pain with ROM or palpation  Neurological:      Mental Status: He is alert.     Comments: Patient alert and oriented to self only, sitting up in bed.  Follows simple commands.   Blind both eyes. Left facial droop. Speech slurred.  HOH, Moves right side preferentially over left. RUE 4/5. LUE 4-/5. RLE 4/5 LLE 3+ prox to 4/5 distally.  Feet hypersensitive to touch. No abnl tone. Sensation otherwise intact Psychiatric:     Comments: cooperative and pleasant   Assessment/Plan: 1. Functional deficits which require 3+ hours per day of interdisciplinary therapy in a comprehensive inpatient rehab setting. Physiatrist is providing close team supervision and 24 hour management of active medical problems listed below. Physiatrist and rehab team continue to assess barriers to discharge/monitor patient progress toward functional and medical goals  Care Tool:  Bathing    Body parts bathed by patient: Right arm, Left arm, Chest, Abdomen, Right upper leg, Left upper leg, Face, Front perineal area   Body parts bathed by helper: Right lower leg, Left lower leg, Buttocks     Bathing assist Assist Level: Moderate Assistance - Patient 50 - 74%     Upper Body Dressing/Undressing  Upper body dressing   What is the patient wearing?: Pull over shirt    Upper body assist Assist Level: Minimal Assistance - Patient > 75%    Lower Body Dressing/Undressing Lower body dressing      What is the patient wearing?: Incontinence brief, Pants     Lower body assist Assist for lower body dressing: Maximal Assistance - Patient 25 - 49%     Toileting Toileting    Toileting assist Assist for toileting: Moderate Assistance - Patient 50 - 74%     Transfers Chair/bed transfer  Transfers assist     Chair/bed transfer assist level: Minimal Assistance - Patient > 75%     Locomotion Ambulation   Ambulation assist      Assist level: Minimal Assistance - Patient > 75% Assistive device: Walker-rolling Max distance: 84f   Walk 10 feet  activity   Assist     Assist level: Minimal Assistance - Patient > 75% Assistive device: Walker-rolling   Walk 50 feet activity   Assist Walk 50 feet with 2 turns activity did not occur: Safety/medical concerns  Assist level: Minimal Assistance - Patient > 75% Assistive device: Walker-rolling    Walk 150 feet activity   Assist Walk 150 feet activity did not occur: Safety/medical concerns (fatigue, weakness/deconditioning)         Walk 10 feet on uneven surface  activity   Assist Walk 10 feet on uneven surfaces activity did not occur: Safety/medical concerns   Assist level: Minimal Assistance - Patient > 75% Assistive device: Walker-rolling   Wheelchair     Assist Is the patient using a wheelchair?: Yes Type of Wheelchair: Manual    Wheelchair assist level: Moderate Assistance - Patient 50 - 74% Max wheelchair distance: 50    Wheelchair 50 feet with 2 turns activity    Assist        Assist Level: Moderate Assistance - Patient 50 - 74%   Wheelchair 150 feet activity     Assist      Assist Level: Maximal Assistance - Patient 25 - 49%   Blood pressure (!) 146/63, pulse (!) 57, temperature (!) 97.5 F (36.4 C), temperature source Oral, resp. rate 16, height 5' 5"$  (1.651 m), weight 76.4 kg, SpO2 100 %.  Medical Problem List and Plan: 1. Functional deficits secondary to right periventricular posterior frontal lobe infarct due to small vessel disease             -patient may shower             -ELOS/Goals: 7-10 days, min assist goals with PT, OT. Min to max with SLP. Pt needs to be more able to assist with care so that his wife can bring him home.  Continue CIR PT OT SLP  Discussed d/c Wednesday 2.  Antithrombotics: -DVT/anticoagulation:  Mechanical: Antiembolism stockings, thigh (TED hose) Bilateral lower extremities             -antiplatelet therapy: Aspirin 81 mg daily and Plavix 75 mg daily x 3 weeks then Plavix alone 3. Pain Management:  Tylenol as needed 4. Mood/Behavior/Sleep: Aricept 5 mg nightly, Atarax 10 mg nightly as needed sleep             -antipsychotic agents: N/A             -keep sleep chart 5. Neuropsych/cognition: This patient is not capable of making decisions on his own behalf. 6. Skin/Wound Care: Routine skin checks 7. Fluids/Electrolytes/Nutrition: Routine in and outs with follow-up  chemistries.             -po intake has generally been steady 8.  Hyperlipidemia.  Continue Crestor 20 mg daily 9.  Diabetes mellitus.  Hemoglobin A1c 10.1.  Currently on SSI.  Patient on Amaryl 1 mg daily, Glucophage 500 mg twice daily prior to admission.  Resume as needed. Provided list of foods that are good for diabetics  CBG (last 3)  Recent Labs    04/17/22 1158 04/17/22 1646 04/19/22 0520  GLUCAP 116* 127* 101*     10.  Permissive hypertension.  Currently on no antihypertensive medication. BP reviewed and no need to resume medication. Patient on Cozaar 25 mg daily, Toprol-XL 25 mg daily and Imdur 30 mg daily prior to admission.  Resume as needed     04/19/2022    5:13 AM 04/18/2022    9:43 PM 04/18/2022    1:08 PM  Vitals with BMI  Systolic [id number]  Diastolic 63 62 69  Pulse 57 62 69     11.  CKD stage III.  Check creatinine today 12.  Total blindness.  Continue eyedrops. 16. Screening for vitamin D deficiency: check level today 14. Cardiomyopathy: restart imdur   >30 minutes spent in discharge of patient including review of medications and follow-up appointments, physical examination, and in answering all patient's questions       LOS: 5 days A FACE TO FACE EVALUATION WAS [place] 04/19/2022, 10:06 AM   `pmr

## 2022-04-19 NOTE — Progress Notes (Signed)
Completed and closed out nursing education and nursing care plan for discharge.

## 2022-04-19 NOTE — Progress Notes (Signed)
Inpatient Rehabilitation Discharge Medication Review by a Pharmacist   A complete drug regimen review was completed for this patient to identify any potential clinically significant medication issues.   High Risk Drug Classes Is patient taking? Indication by Medication  Antipsychotic No    Anticoagulant No    Antibiotic No    Opioid No    Antiplatelet Yes ASA, Plavix - TIA *DAPT x 3weeks, then Plavix alone starting 05/01/22  Hypoglycemics/insulin Yes Glimepiride, metformin - T2DM  Vasoactive Medication Yes  Imdur-HTN  Chemotherapy No    Other Yes Acetaminophen- prn pain Donepezil - dementia Latanoprost, Timolol eye drops - glaucoma Crestor - HLD Pantoprazole-GERD Melatonin-sleep Albuterol inhaler, duonebs- prn wheezing, shortness of breath        Type of Medication Issue Identified Description of Issue Recommendation(s)  Drug Interaction(s) (clinically significant)        Duplicate Therapy        Allergy        No Medication Administration End Date        Incorrect Dose        Additional Drug Therapy Needed        Significant med changes from prior encounter (inform family/care partners about these prior to discharge). PTA meds: losartan, metoprolol succinate, hydroxyzine.  These medications were discontinued.    Other          Clinically significant medication issues were identified that warrant physician communication and completion of prescribed/recommended actions by midnight of the next day:  No   Name of provider notified for urgent issues identified:    Provider Method of Notification:    Pharmacist comments:    Time spent performing this drug regimen review (minutes):  [place], RPh Clinical Pharmacist

## 2022-04-20 ENCOUNTER — Telehealth: Payer: Self-pay | Admitting: Registered Nurse

## 2022-04-20 NOTE — Telephone Encounter (Signed)
Transitional Care call Transitional Questions answered by Mrs. Beaudoin   Patient name: DANNA LASTRAPES DOB: 1928/07/05 Are you/is patient experiencing any problems since coming home? No Are there any questions regarding any aspect of care? No Are there any questions regarding medications administration/dosing? No Are meds being taken as prescribed? Yes "Patient should review meds with caller to confirm" Medication list was reviewed,  Have there been any falls? No Has Home Health been to the house and/or have they contacted you? Yes, [place]: Has a scheduled appointment If not, have you tried to contact them? NA: See above  Can we help you contact them? NA: See above  Are bowels and bladder emptying properly? Yes Are there any unexpected incontinence issues? No If applicable, is patient following bowel/bladder programs? NA Any fevers, problems with breathing, unexpected pain? No Are there any skin problems or new areas of breakdown? No Has the patient/family member arranged specialty MD follow up (ie cardiology/neurology/renal/surgical/etc.)?  Mrs. Buter states Mr. Villapando has scheduled appointment with Neurology. She was encouraged to call his PCP to schedule HFU appointment, she verbalizes understanding.  Can we help arrange? No Does the patient need any other services or support that we can help arrange? No Are caregivers following through as expected in assisting the patient? Yes Has the patient quit smoking, drinking alcohol, or using drugs as recommended? (                        )  Appointment date/time 05/02/2022, he has a scheduled appointment with Dr Ranell Patrick on 05/02/2022  arrival time 10:20 for 10:40 appointment At [place]

## 2022-04-21 ENCOUNTER — Telehealth: Payer: Self-pay

## 2022-04-21 NOTE — Telephone Encounter (Signed)
Grant Jensen calling in saying she wanted to let you know that there will be a delay in his [facility] care and wife said they wanted the visit to be on 2/20 which Is when she will visit the home for his start of care

## 2022-04-24 DIAGNOSIS — I639 Cerebral infarction, unspecified: Secondary | ICD-10-CM | POA: Diagnosis not present

## 2022-04-26 ENCOUNTER — Telehealth: Payer: Self-pay | Admitting: *Deleted

## 2022-04-26 DIAGNOSIS — I129 Hypertensive chronic kidney disease with stage 1 through stage 4 chronic kidney disease, or unspecified chronic kidney disease: Secondary | ICD-10-CM | POA: Diagnosis not present

## 2022-04-26 DIAGNOSIS — Z8673 Personal history of transient ischemic attack (TIA), and cerebral infarction without residual deficits: Secondary | ICD-10-CM | POA: Diagnosis not present

## 2022-04-26 DIAGNOSIS — N183 Chronic kidney disease, stage 3 unspecified: Secondary | ICD-10-CM | POA: Diagnosis not present

## 2022-04-26 DIAGNOSIS — E1122 Type 2 diabetes mellitus with diabetic chronic kidney disease: Secondary | ICD-10-CM | POA: Diagnosis not present

## 2022-04-26 DIAGNOSIS — I69354 Hemiplegia and hemiparesis following cerebral infarction affecting left non-dominant side: Secondary | ICD-10-CM | POA: Diagnosis not present

## 2022-04-26 DIAGNOSIS — F5101 Primary insomnia: Secondary | ICD-10-CM | POA: Diagnosis not present

## 2022-04-26 DIAGNOSIS — E11319 Type 2 diabetes mellitus with unspecified diabetic retinopathy without macular edema: Secondary | ICD-10-CM | POA: Diagnosis not present

## 2022-04-26 NOTE — Telephone Encounter (Signed)
[place] calling back as FYI that start of care visit will now be 04/26/22 per patient's wife's request.

## 2022-05-02 ENCOUNTER — Encounter
Payer: Medicare Other | Attending: Physical Medicine and Rehabilitation | Admitting: Physical Medicine and Rehabilitation

## 2022-05-02 ENCOUNTER — Encounter: Payer: Self-pay | Admitting: Physical Medicine and Rehabilitation

## 2022-05-02 VITALS — BP 129/68 | HR 78 | Ht 65.0 in

## 2022-05-02 DIAGNOSIS — I639 Cerebral infarction, unspecified: Secondary | ICD-10-CM

## 2022-05-02 NOTE — Progress Notes (Signed)
Subjective:    Patient ID: Grant Calkins., male    DOB: 02-21-1929, 89 y.o.   MRN: [id number]  HPI Grant Jensen is a 89 year old man who presents for hospital follow-up after CVA.  Needs help all the time, wife helps with him bathroom, shower Denies pain Has had dementia for 2.5 years BP looks excellent Incontinent Fell this morning- wife feels they would benefit from home health aide  Pain Inventory Average Pain 0 Pain Right Now 0 My pain is  no pain  LOCATION OF PAIN  no pain  BOWEL Number of stools per week: 6  Incontinent Yes   BLADDER Pads  Bladder incontinence Yes    Mobility walk with assistance use a cane use a walker do you drive?  no needs help with transfers Do you have any goals in this area?  yes  Function retired I need assistance with the following:  feeding, dressing, bathing, toileting, meal prep, household duties, and shopping Do you have any goals in this area?  yes  Neuro/Psych confusion depression  Prior Studies Any changes since last visit?  no  Physicians involved in your care Any changes since last visit?  no   No family history on file. Social History   Socioeconomic History   Marital status: Married    Spouse name: Not on file   Number of children: Not on file   Years of education: Not on file   Highest education level: Not on file  Occupational History   Not on file  Tobacco Use   Smoking status: Never   Smokeless tobacco: Not on file  Substance and Sexual Activity   Alcohol use: Yes   Drug use: Not Currently   Sexual activity: Not Currently  Other Topics Concern   Not on file  Social History Narrative   Not on file   Social Determinants of Health   Financial Resource Strain: Not on file  Food Insecurity: No Food Insecurity (04/09/2022)   Hunger Vital Sign    Worried About Running Out of Food in the Last Year: Never true    Ran Out of Food in the Last Year: Never true  Transportation Needs: No  Transportation Needs (04/10/2022)   PRAPARE - Hydrologist (Medical): No    Lack of Transportation (Non-Medical): No  Physical Activity: Not on file  Stress: Not on file  Social Connections: Not on file   Past Surgical History:  Procedure Laterality Date   LEFT HEART CATHETERIZATION WITH CORONARY ANGIOGRAM N/A 06/22/2014   Procedure: LEFT HEART CATHETERIZATION WITH CORONARY ANGIOGRAM;  Surgeon: Adrian Prows, MD;  Location: [facility] CATH LAB;  Service: Cardiovascular;  Laterality: N/A;   Past Medical History:  Diagnosis Date   Blindness    Dementia ([place])    Diabetes mellitus without complication ([place])    Glaucoma    Hypertension    There were no vitals taken for this visit.  Opioid Risk Score:   Fall Risk Score:  `1  Depression screen PHQ 2/9      No data to display          Review of Systems  Respiratory:  Positive for cough and wheezing.   Musculoskeletal:  Positive for gait problem.  Skin:  Positive for rash.       On the back  Psychiatric/Behavioral:  Positive for confusion.        Depression  All other systems reviewed and are negative.  Objective:   Physical Exam  Gen: no distress, normal appearing HEENT: oral mucosa pink and moist, NCAT Cardio: Reg rate Chest: normal effort, normal rate of breathing Abd: soft, non-distended Ext: no edema Psych: pleasant, normal affect Skin: intact Neuro: Alert and oriented, right sided weakness, wheelchair bound, BP well controlled      Assessment & Plan:   1) CVA -continue therapies -would benefit from handicap placard to increase mobility in the community -reviewed all medications and provided necessary refills. -discussed recent fall, prescribed home health aide to assist wife   2) Diabetes: -reviewed CBGs and discussed with wife that she does not have to check CBGs daily given excellent control -discussed the following foods which are good for diabetes: 1) cinnamon- imitates  effects of insulin, increasing glucose transport into cells ([place] or [place] cinnamon is best, least processed) 2) nuts- can slow down the blood sugar response of carbohydrate rich foods 3) oatmeal- contains and anti-inflammatory compound avenanthramide 4) whole-milk yogurt (best types are no sugar, [place] yogurt, or goat/sheep yogurt) 5) beans- high in protein, fiber, and vitamins, low glycemic index 6) broccoli- great source of vitamin A and C 7) quinoa- higher in protein and fiber than other grains 8) spinach- high in vitamin A, fiber, and protein 9) olive oil- reduces glucose levels, LDL, and triglycerides 10) salmon- excellent amount of omega-3-fatty acids 11) walnuts- rich in antioxidants 12) apples- high in fiber and quercetin 13) carrots- highly nutritious with low impact on blood sugar 14) eggs- improve HDL (good cholesterol), high in protein, keep you satiated 15) turmeric: improves blood sugars, cardiovascular disease, and protects kidney health 16) garlic: improves blood sugar, blood pressure, pain 17) tomatoes: highly nutritious with low impact on blood sugar   Current impairments: Patient needs assistance with all ADLs and mobility   Patient will require home OT and PT; orders have been placed  The patient's medical and/or psychosocial problems require moderate decision-making during transitions in care from inpatient rehabilitation to home. I This transitional care appointment included review of the patient's hospital discharge summary, review of the patient's hospital diagnostic tests and discussion of appropriate follow-up, education of the patient regarding their condition, re-establishment of necessary referrals. I will be reviewing patient's home and/or outpatient therapy notes as they progress through therapy and corresponding with therapists accordingly. I have encouraged compliance with current medication regimen (with adjustment to regimen as needed), follow-up with  necessary providers, and the importance of following a healthy diet and exercise routine to maximize recovery, health, and quality of life.

## 2022-05-02 NOTE — Patient Instructions (Signed)
Foods which are good for diabetes: 1) cinnamon- imitates effects of insulin, increasing glucose transport into cells ([place] or [place] cinnamon is best, least processed) 2) nuts- can slow down the blood sugar response of carbohydrate rich foods 3) oatmeal- contains and anti-inflammatory compound avenanthramide 4) whole-milk yogurt (best types are no sugar, [place] yogurt, or goat/sheep yogurt) 5) beans- high in protein, fiber, and vitamins, low glycemic index 6) broccoli- great source of vitamin A and C 7) quinoa- higher in protein and fiber than other grains 8) spinach- high in vitamin A, fiber, and protein 9) olive oil- reduces glucose levels, LDL, and triglycerides 10) salmon- excellent amount of omega-3-fatty acids 11) walnuts- rich in antioxidants 12) apples- high in fiber and quercetin 13) carrots- highly nutritious with low impact on blood sugar 14) eggs- improve HDL (good cholesterol), high in protein, keep you satiated 15) turmeric: improves blood sugars, cardiovascular disease, and protects kidney health 16) garlic: improves blood sugar, blood pressure, pain 17) tomatoes: highly nutritious with low impact on blood sugar

## 2022-05-02 NOTE — Progress Notes (Deleted)
   Subjective:    Patient ID: Grant Calkins., male    DOB: 05/13/1928, 89 y.o.   MRN: [id number]  HPI   Pain Inventory Average Pain {NUMBERS; 0-10:5044} Pain Right Now {NUMBERS; 0-10:5044} My pain is {PAIN DESCRIPTION:21022940}  In the last 24 hours, has pain interfered with the following? General activity {NUMBERS; 0-10:5044} Relation with others {NUMBERS; 0-10:5044} Enjoyment of life {NUMBERS; 0-10:5044} What TIME of day is your pain at its worst? {time of day:24191} Sleep (in general) {BHH GOOD/FAIR/POOR:22877}  Pain is worse with: {ACTIVITIES:21022942} Pain improves with: {PAIN IMPROVES SV:5789238 Relief from Meds: {NUMBERS; 0-10:5044}  {MOBILITY IM:9870394  {FUNCTION:21022946}  {NEURO/PSYCH:21022948}  {CPRM PRIOR STUDIES:21022953}  {CPRM PHYSICIANS INVOLVED IN YOUR CARE:21022954}    No family history on file. Social History   Socioeconomic History   Marital status: Married    Spouse name: Not on file   Number of children: Not on file   Years of education: Not on file   Highest education level: Not on file  Occupational History   Not on file  Tobacco Use   Smoking status: Never   Smokeless tobacco: Not on file  Substance and Sexual Activity   Alcohol use: Yes   Drug use: Not Currently   Sexual activity: Not Currently  Other Topics Concern   Not on file  Social History Narrative   Not on file   Social Determinants of Health   Financial Resource Strain: Not on file  Food Insecurity: No Food Insecurity (04/09/2022)   Hunger Vital Sign    Worried About Running Out of Food in the Last Year: Never true    Ran Out of Food in the Last Year: Never true  Transportation Needs: No Transportation Needs (04/10/2022)   PRAPARE - Hydrologist (Medical): No    Lack of Transportation (Non-Medical): No  Physical Activity: Not on file  Stress: Not on file  Social Connections: Not on file   Past Surgical History:  Procedure  Laterality Date   LEFT HEART CATHETERIZATION WITH CORONARY ANGIOGRAM N/A 06/22/2014   Procedure: LEFT HEART CATHETERIZATION WITH CORONARY ANGIOGRAM;  Surgeon: Adrian Prows, MD;  Location: [facility] CATH LAB;  Service: Cardiovascular;  Laterality: N/A;   Past Medical History:  Diagnosis Date   Blindness    Dementia ([place])    Diabetes mellitus without complication ([place])    Glaucoma    Hypertension    There were no vitals taken for this visit.  Opioid Risk Score:   Fall Risk Score:  `1  Depression screen PHQ 2/9      No data to display          Review of Systems     Objective:   Physical Exam        Assessment & Plan:

## 2022-05-04 DIAGNOSIS — E1122 Type 2 diabetes mellitus with diabetic chronic kidney disease: Secondary | ICD-10-CM | POA: Diagnosis not present

## 2022-05-04 DIAGNOSIS — I69354 Hemiplegia and hemiparesis following cerebral infarction affecting left non-dominant side: Secondary | ICD-10-CM | POA: Diagnosis not present

## 2022-05-04 DIAGNOSIS — E11319 Type 2 diabetes mellitus with unspecified diabetic retinopathy without macular edema: Secondary | ICD-10-CM | POA: Diagnosis not present

## 2022-05-04 DIAGNOSIS — I129 Hypertensive chronic kidney disease with stage 1 through stage 4 chronic kidney disease, or unspecified chronic kidney disease: Secondary | ICD-10-CM | POA: Diagnosis not present

## 2022-05-04 DIAGNOSIS — N183 Chronic kidney disease, stage 3 unspecified: Secondary | ICD-10-CM | POA: Diagnosis not present

## 2022-05-13 DIAGNOSIS — R051 Acute cough: Secondary | ICD-10-CM | POA: Diagnosis not present

## 2022-05-13 DIAGNOSIS — J470 Bronchiectasis with acute lower respiratory infection: Secondary | ICD-10-CM | POA: Diagnosis not present

## 2022-05-23 DIAGNOSIS — I639 Cerebral infarction, unspecified: Secondary | ICD-10-CM | POA: Diagnosis not present

## 2022-06-06 ENCOUNTER — Ambulatory Visit: Payer: Medicare Other | Admitting: Neurology

## 2022-06-06 ENCOUNTER — Encounter: Payer: Self-pay | Admitting: Neurology

## 2022-06-06 VITALS — BP 128/73 | HR 82 | Ht 65.0 in | Wt 168.0 lb

## 2022-06-06 DIAGNOSIS — F02A4 Dementia in other diseases classified elsewhere, mild, with anxiety: Secondary | ICD-10-CM | POA: Diagnosis not present

## 2022-06-06 DIAGNOSIS — R531 Weakness: Secondary | ICD-10-CM

## 2022-06-06 DIAGNOSIS — I6381 Other cerebral infarction due to occlusion or stenosis of small artery: Secondary | ICD-10-CM | POA: Diagnosis not present

## 2022-06-06 DIAGNOSIS — G301 Alzheimer's disease with late onset: Secondary | ICD-10-CM

## 2022-06-06 MED ORDER — COENZYME Q10 30 MG PO CAPS: 200.0000 mg | ORAL_CAPSULE | Freq: Three times a day (TID) | ORAL | 1 refills | Status: DC

## 2022-06-06 MED ORDER — DONEPEZIL HCL 10 MG PO TABS: 10.0000 mg | ORAL_TABLET | Freq: Every day | ORAL | 3 refills | Status: DC

## 2022-06-06 NOTE — Patient Instructions (Signed)
I had a long d/w patient and his daughter about his recent lacunar stroke, mild Zimmers dementia, risk for recurrent stroke/TIAs, personally independently reviewed imaging studies and stroke evaluation results and answered questions.Continue clopidogrel 75 mg daily  for secondary stroke prevention and maintain strict control of hypertension with blood pressure goal below 130/90, diabetes with hemoglobin A1c goal below 6.5% and lipids with LDL cholesterol goal below 70 mg/dL. I also advised the patient to eat a healthy diet with plenty of whole grains, cereals, fruits and vegetables, exercise regularly and maintain ideal body weight .I recommended adding CoQ 10 200 mg daily to help with statin myalgia.  I also recommend increasing Aricept dose to 10 mg daily to help with his dementia.  I discussed possible side effects and advised her to call me if needed.  Followup in the future with my nurse practitioner in 6 months or call earlier if necessary.  Stroke Prevention Some medical conditions and behaviors can lead to a higher chance of having a stroke. You can help prevent a stroke by eating healthy, exercising, not smoking, and managing any medical conditions you have. Stroke is a leading cause of functional impairment. Primary prevention is particularly important because a majority of strokes are first-time events. Stroke changes the lives of not only those who experience a stroke but also their family and other caregivers. How can this condition affect me? A stroke is a medical emergency and should be treated right away. A stroke can lead to brain damage and can sometimes be life-threatening. If a person gets medical treatment right away, there is a better chance of surviving and recovering from a stroke. What can increase my risk? The following medical conditions may increase your risk of a stroke: Cardiovascular disease. High blood pressure (hypertension). Diabetes. High cholesterol. Sickle cell  disease. Blood clotting disorders (hypercoagulable state). Obesity. Sleep disorders (obstructive sleep apnea). Other risk factors include: Being older than age 65. Having a history of blood clots, stroke, or mini-stroke (transient ischemic attack, TIA). Genetic factors, such as race, ethnicity, or a family history of stroke. Smoking cigarettes or using other tobacco products. Taking birth control pills, especially if you also use tobacco. Heavy use of alcohol or drugs, especially cocaine and methamphetamine. Physical inactivity. What actions can I take to prevent this? Manage your health conditions High cholesterol levels. Eating a healthy diet is important for preventing high cholesterol. If cholesterol cannot be managed through diet alone, you may need to take medicines. Take any prescribed medicines to control your cholesterol as told by your health care provider. Hypertension. To reduce your risk of stroke, try to keep your blood pressure below 130/80. Eating a healthy diet and exercising regularly are important for controlling blood pressure. If these steps are not enough to manage your blood pressure, you may need to take medicines. Take any prescribed medicines to control hypertension as told by your health care provider. Ask your health care provider if you should monitor your blood pressure at home. Have your blood pressure checked every year, even if your blood pressure is normal. Blood pressure increases with age and some medical conditions. Diabetes. Eating a healthy diet and exercising regularly are important parts of managing your blood sugar (glucose). If your blood sugar cannot be managed through diet and exercise, you may need to take medicines. Take any prescribed medicines to control your diabetes as told by your health care provider. Get evaluated for obstructive sleep apnea. Talk to your health care provider about getting a  sleep evaluation if you snore a lot or have  excessive sleepiness. Make sure that any other medical conditions you have, such as atrial fibrillation or atherosclerosis, are managed. Nutrition Follow instructions from your health care provider about what to eat or drink to help manage your health condition. These instructions may include: Reducing your daily calorie intake. Limiting how much salt (sodium) you use to 1,500 milligrams (mg) each day. Using only healthy fats for cooking, such as olive oil, canola oil, or sunflower oil. Eating healthy foods. You can do this by: Choosing foods that are high in fiber, such as whole grains, and fresh fruits and vegetables. Eating at least 5 servings of fruits and vegetables a day. Try to fill one-half of your plate with fruits and vegetables at each meal. Choosing lean protein foods, such as lean cuts of meat, poultry without skin, fish, tofu, beans, and nuts. Eating low-fat dairy products. Avoiding foods that are high in sodium. This can help lower blood pressure. Avoiding foods that have saturated fat, trans fat, and cholesterol. This can help prevent high cholesterol. Avoiding processed and prepared foods. Counting your daily carbohydrate intake.  Lifestyle If you drink alcohol: Limit how much you have to: 0-1 drink a day for women who are not pregnant. 0-2 drinks a day for men. Know how much alcohol is in your drink. In the [place] one drink equals one 12 oz bottle of beer (314mL), one 5 oz glass of wine (190mL), or one 1 oz glass of hard liquor (44mL). Do not use any products that contain nicotine or tobacco. These products include cigarettes, chewing tobacco, and vaping devices, such as e-cigarettes. If you need help quitting, ask your health care provider. Avoid secondhand smoke. Do not use drugs. Activity  Try to stay at a healthy weight. Get at least 30 minutes of exercise on most days, such as: Fast walking. Biking. Swimming. Medicines Take over-the-counter and prescription  medicines only as told by your health care provider. Aspirin or blood thinners (antiplatelets or anticoagulants) may be recommended to reduce your risk of forming blood clots that can lead to stroke. Avoid taking birth control pills. Talk to your health care provider about the risks of taking birth control pills if: You are over 51 years old. You smoke. You get very bad headaches. You have had a blood clot. Where to find more information American Stroke Association: www.strokeassociation.org Get help right away if: You or a loved one has any symptoms of a stroke. "BE FAST" is an easy way to remember the main warning signs of a stroke: B - Balance. Signs are dizziness, sudden trouble walking, or loss of balance. E - Eyes. Signs are trouble seeing or a sudden change in vision. F - Face. Signs are sudden weakness or numbness of the face, or the face or eyelid drooping on one side. A - Arms. Signs are weakness or numbness in an arm. This happens suddenly and usually on one side of the body. S - Speech. Signs are sudden trouble speaking, slurred speech, or trouble understanding what people say. T - Time. Time to call emergency services. Write down what time symptoms started. You or a loved one has other signs of a stroke, such as: A sudden, severe headache with no known cause. Nausea or vomiting. Seizure. These symptoms may represent a serious problem that is an emergency. Do not wait to see if the symptoms will go away. Get medical help right away. Call your local emergency services (911 in  the [place] Do not drive yourself to the hospital. Summary You can help to prevent a stroke by eating healthy, exercising, not smoking, limiting alcohol intake, and managing any medical conditions you may have. Do not use any products that contain nicotine or tobacco. These include cigarettes, chewing tobacco, and vaping devices, such as e-cigarettes. If you need help quitting, ask your health care  provider. Remember "BE FAST" for warning signs of a stroke. Get help right away if you or a loved one has any of these signs. This information is not intended to replace advice given to you by your health care provider. Make sure you discuss any questions you have with your health care provider. Document Revised: 09/04/2019 Document Reviewed: 09/22/2019 Elsevier Patient Education  [place].

## 2022-06-06 NOTE — Progress Notes (Signed)
[facility] [address] [place]. [place] [address] [phone]       OFFICE CONSULT NOTE  [name]. Date of Birth:  [birth year] Medical Record Number:  [id number]   Referring MD:  Silvestre Mesi, PA-c  Reason for Referral:  Stroke  HPI: 89 year old African-American male seen today for initial office consultation visit for recent stroke.  Patient is accompanied by his wife.  History is obtained from them and review of electronic medical records and I personally reviewed pertinent available imaging films in PACS.  He has past medical history of legal blindness from glaucoma and diabetic retinopathy, diabetes, hypertension and hyperlipidemia.  Patient woke up on 2//24 with left-sided weakness and slurred speech.  He was unable to stand BPN due to incontinence of urine.  He presented outside time window for thrombolysis.  CT head showed no acute abnormality.  CT angiogram showed severe stenosis of one of the right M2 proximal branches and moderate focal stenosis of the right P2 segment of the posterior cerebral artery.  Moderate stenosis of the origin of the left vertebral artery.  MRI showed small acute infarct in the right periventricular posterior frontal white matter.  2D echo showed ejection fraction of 60 to 65% with grade 1 diastolic dysfunction.  LDL cholesterol 90 mg percent.  Hemoglobin A1c was elevated at 10.1.  EEG showed mild diffuse slowing.  Patient was on aspirin prior to admission as well as change to aspirin and Plavix for 3 weeks followed by Plavix alone.  He was transferred to inpatient rehab and progressed well with therapies and currently back at home with family.  He still has some mild subjective left hand and leg weakness but able to ambulate well with holding onto someone.  He is tolerating Plavix well with minor bruising and no bleeding.  His sugars are much better to see primary care physician few weeks.  A1c checked.  He is tolerating Crestor  well without muscle aches and pains.  He has had no recurrent stroke or TIA symptoms.  Patient does have a history of Alzheimer's dementia diagnosed 2 years ago.  He is on Aricept 5 mg daily never tried increasing the dose.  Does have poor short-term memory and does get confused and oriented easily.  Has not had any behavioral issues, delusions or hallucinations.  He has 24-hour care at home currently.  ROS:   14 system review of systems is positive for blindness, weakness, memory difficulties, confusion, disorientation and all other systems negative  PMH:  Past Medical History:  Diagnosis Date   Blindness    Dementia    Diabetes mellitus without complication    Glaucoma    Hypertension     Social History:  Social History   Socioeconomic History   Marital status: Married    Spouse name: Not on file   Number of children: Not on file   Years of education: Not on file   Highest education level: Not on file  Occupational History   Not on file  Tobacco Use   Smoking status: Never   Smokeless tobacco: Not on file  Vaping Use   Vaping Use: Never used  Substance and Sexual Activity   Alcohol use: Yes   Drug use: Not Currently   Sexual activity: Not Currently  Other Topics Concern   Not on file  Social History Narrative   Not on file   Social Determinants of Health   Financial Resource Strain: Not on file  Food  Insecurity: No Food Insecurity (04/09/2022)   Hunger Vital Sign    Worried About Running Out of Food in the Last Year: Never true    Ran Out of Food in the Last Year: Never true  Transportation Needs: No Transportation Needs (04/10/2022)   PRAPARE - Hydrologist (Medical): No    Lack of Transportation (Non-Medical): No  Physical Activity: Not on file  Stress: Not on file  Social Connections: Not on file  Intimate Partner Violence: Not At Risk (04/10/2022)   Humiliation, Afraid, Rape, and Kick questionnaire    Fear of Current or Ex-Partner:  No    Emotionally Abused: No    Physically Abused: No    Sexually Abused: No    Medications:   Current Outpatient Medications on File Prior to Visit  Medication Sig Dispense Refill   acetaminophen (TYLENOL) 325 MG tablet Take 2 tablets (650 mg total) by mouth every 4 (four) hours as needed for mild pain (or temp > 37.5 C (99.5 F)).     albuterol (VENTOLIN HFA) 108 (90 Base) MCG/ACT inhaler Inhale 2 puffs into the lungs every 4 (four) hours as needed for wheezing or shortness of breath.     clopidogrel (PLAVIX) 75 MG tablet Take 1 tablet (75 mg total) by mouth daily. 30 tablet 0   donepezil (ARICEPT) 5 MG tablet Take 5 mg by mouth at bedtime.     glimepiride (AMARYL) 1 MG tablet Take 1 tablet (1 mg total) by mouth every morning. 30 tablet 0   ipratropium-albuterol (DUONEB) 0.5-2.5 (3) MG/3ML SOLN Take 3 mLs by nebulization every 6 (six) hours as needed (SOB).     isosorbide mononitrate (IMDUR) 30 MG 24 hr tablet Take 1 tablet (30 mg total) by mouth daily. 30 tablet 0   latanoprost (XALATAN) 0.005 % ophthalmic solution Place 1 drop into both eyes at bedtime.     melatonin 3 MG TABS tablet Take 1 tablet (3 mg total) by mouth at bedtime. 30 tablet 0   metFORMIN (GLUCOPHAGE) 500 MG tablet Take 1 tablet (500 mg total) by mouth daily with breakfast. 30 tablet 0   pantoprazole (PROTONIX) 40 MG tablet Take 1 tablet (40 mg total) by mouth daily. 30 tablet 0   rosuvastatin (CRESTOR) 20 MG tablet Take 1 tablet (20 mg total) by mouth daily. 30 tablet 0   timolol (BETIMOL) 0.5 % ophthalmic solution Place 1 drop into the left eye 2 (two) times daily.     traZODone (DESYREL) 50 MG tablet Take 50 mg by mouth at bedtime.     No current facility-administered medications on file prior to visit.    Allergies:   Allergies  Allergen Reactions   Penicillins Other (See Comments)   Lisinopril Cough   Penicillin G Hives and Rash    Physical Exam General: well developed, well nourished, seated, in no evident  distress Head: head normocephalic and atraumatic.   Neck: supple with no carotid or supraclavicular bruits Cardiovascular: regular rate and rhythm, no murmurs Musculoskeletal: no deformity Skin:  no rash/petichiae Vascular:  Normal pulses all extremities  Neurologic Exam Mental Status: Awake and fully alert. Disoriented to place and time. Recent and remote memory poor. Attention span, concentration and fund of knowledge diminished. Mood and affect appropriate.  MMSE deferred as patient is legally blind in both eyes.  Diminished recall 0/3. Cranial Nerves: Fundoscopic exam  not done.  Patient is legally blind in both eyes pupils unequal, not reactive to light. Extraocular movements  full without nystagmus. Visual fields cannot be tested hearing intact. Facial sensation intact. Face, tongue, palate moves normally and symmetrically.  Motor: Normal bulk and tone. Normal strength in all tested extremity muscles.  Diminished fine finger movements on the left.  Orbits right over left upper extremity. Sensory.: intact to touch , pinprick , position and vibratory sensation.  Coordination: Cannot test due to blindness  gait and Station: Unable to test as patient is blind and does not walk  reflexes: 1+ and symmetric. Toes downgoing.     Modified Rankin  4   ASSESSMENT: 89 year old African-American male with right subcortical lacunar infarct in February 2024 from which patient seems to have recovered quite well.  He also has mild Alzheimer's dementia.  Vascular risk factors diabetes, hypertension, hyperlipidemia, obesity and age     PLAN:I had a long d/w patient and his daughter about his recent lacunar stroke, mild Alxheimer`s dementia, risk for recurrent stroke/TIAs, personally independently reviewed imaging studies and stroke evaluation results and answered questions.Continue clopidogrel 75 mg daily  for secondary stroke prevention and maintain strict control of hypertension with blood pressure goal  below 130/90, diabetes with hemoglobin A1c goal below 6.5% and lipids with LDL cholesterol goal below 70 mg/dL. I also advised the patient to eat a healthy diet with plenty of whole grains, cereals, fruits and vegetables, exercise regularly and maintain ideal body weight .I recommended adding CoQ 10 200 mg daily to help with statin myalgia.  I also recommend increasing Aricept dose to 10 mg daily to help with his dementia.  I discussed possible side effects and advised her to call me if needed.  Followup in the future with my nurse practitioner in 6 months or call earlier if necessary.  Greater than 50% time during this prolonged 60-minute consultation visit.  In counseling and coordination of care about stroke and Alzheimer's dementia  Antony Contras, MD Note: This document was prepared with digital dictation and possible smart phrase technology. Any transcriptional errors that result from this process are unintentional.

## 2022-06-13 DIAGNOSIS — J470 Bronchiectasis with acute lower respiratory infection: Secondary | ICD-10-CM | POA: Diagnosis not present

## 2022-06-13 DIAGNOSIS — R051 Acute cough: Secondary | ICD-10-CM | POA: Diagnosis not present

## 2022-06-26 DIAGNOSIS — I10 Essential (primary) hypertension: Secondary | ICD-10-CM | POA: Diagnosis not present

## 2022-06-26 DIAGNOSIS — Z8673 Personal history of transient ischemic attack (TIA), and cerebral infarction without residual deficits: Secondary | ICD-10-CM | POA: Diagnosis not present

## 2022-06-26 DIAGNOSIS — R600 Localized edema: Secondary | ICD-10-CM | POA: Diagnosis not present

## 2022-06-26 DIAGNOSIS — E1122 Type 2 diabetes mellitus with diabetic chronic kidney disease: Secondary | ICD-10-CM | POA: Diagnosis not present

## 2022-07-13 DIAGNOSIS — J470 Bronchiectasis with acute lower respiratory infection: Secondary | ICD-10-CM | POA: Diagnosis not present

## 2022-07-13 DIAGNOSIS — R051 Acute cough: Secondary | ICD-10-CM | POA: Diagnosis not present

## 2022-08-01 DIAGNOSIS — E1122 Type 2 diabetes mellitus with diabetic chronic kidney disease: Secondary | ICD-10-CM | POA: Diagnosis not present

## 2022-08-01 DIAGNOSIS — R062 Wheezing: Secondary | ICD-10-CM | POA: Diagnosis not present

## 2022-08-01 DIAGNOSIS — Z8673 Personal history of transient ischemic attack (TIA), and cerebral infarction without residual deficits: Secondary | ICD-10-CM | POA: Diagnosis not present

## 2022-08-01 DIAGNOSIS — I420 Dilated cardiomyopathy: Secondary | ICD-10-CM | POA: Diagnosis not present

## 2022-08-01 DIAGNOSIS — Z9181 History of falling: Secondary | ICD-10-CM | POA: Diagnosis not present

## 2022-08-01 DIAGNOSIS — R0602 Shortness of breath: Secondary | ICD-10-CM | POA: Diagnosis not present

## 2022-08-01 DIAGNOSIS — R051 Acute cough: Secondary | ICD-10-CM | POA: Diagnosis not present

## 2022-08-01 DIAGNOSIS — F5101 Primary insomnia: Secondary | ICD-10-CM | POA: Diagnosis not present

## 2022-08-09 ENCOUNTER — Other Ambulatory Visit: Payer: Self-pay | Admitting: Internal Medicine

## 2022-08-09 ENCOUNTER — Ambulatory Visit
Admission: RE | Admit: 2022-08-09 | Discharge: 2022-08-09 | Disposition: A | Discharge status: Home / Self Care | Payer: Medicare Other | Source: Ambulatory Visit | Attending: Internal Medicine | Admitting: Internal Medicine

## 2022-08-09 DIAGNOSIS — R059 Cough, unspecified: Secondary | ICD-10-CM | POA: Diagnosis not present

## 2022-08-09 DIAGNOSIS — R062 Wheezing: Secondary | ICD-10-CM | POA: Diagnosis not present

## 2022-08-09 DIAGNOSIS — R053 Chronic cough: Secondary | ICD-10-CM

## 2022-08-13 DIAGNOSIS — R051 Acute cough: Secondary | ICD-10-CM | POA: Diagnosis not present

## 2022-08-13 DIAGNOSIS — J470 Bronchiectasis with acute lower respiratory infection: Secondary | ICD-10-CM | POA: Diagnosis not present

## 2022-08-24 DIAGNOSIS — U071 COVID-19: Secondary | ICD-10-CM | POA: Diagnosis not present

## 2022-08-29 IMAGING — CT CT HEAD W/O CM
3 series · 15 of 47 positions shown, 18 images · non-contrast
Comparison: 04/30/2020

CLINICAL DATA: Neuro deficit, acute, stroke suspected weakness,
drooling. Confusion.



[Series 2: head wo · axial · 0.47mm/px · z∈[-344,-214]mm · 9 of 32 slices shown, 12 images]
[im 3/32  brain]
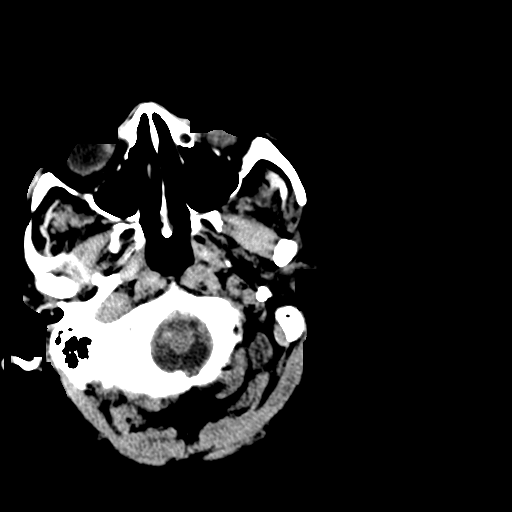
[im 3/32  bone]
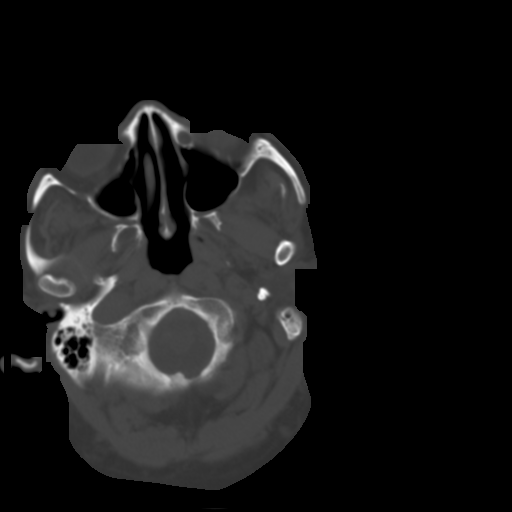
[im 6/32  brain]
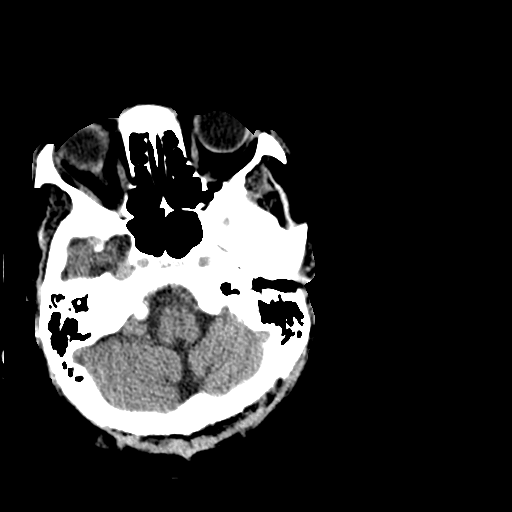
[im 9/32  brain]
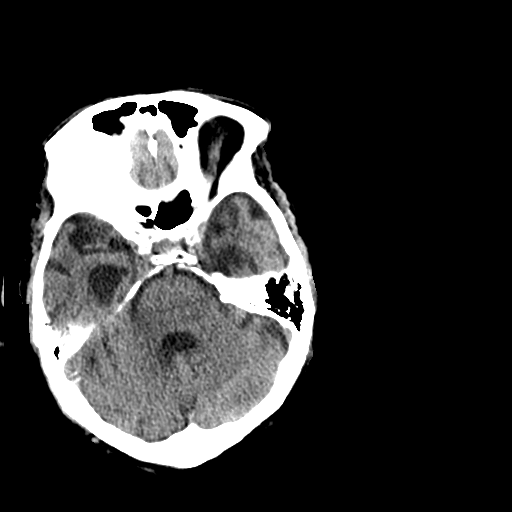
[im 12/32  brain]
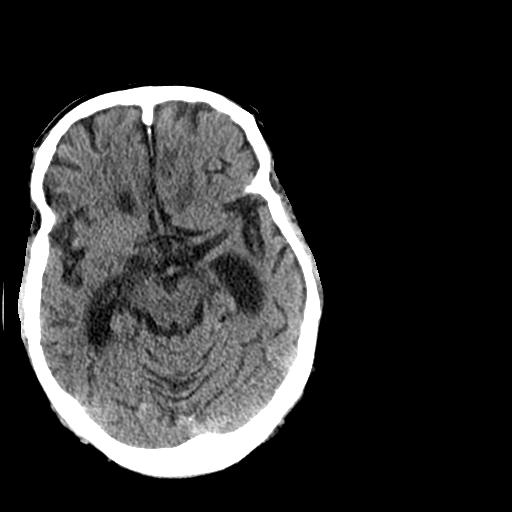
[im 17/32  brain]
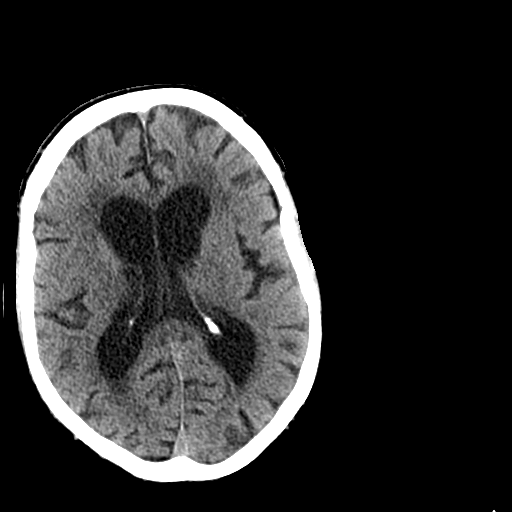
[im 17/32  bone]
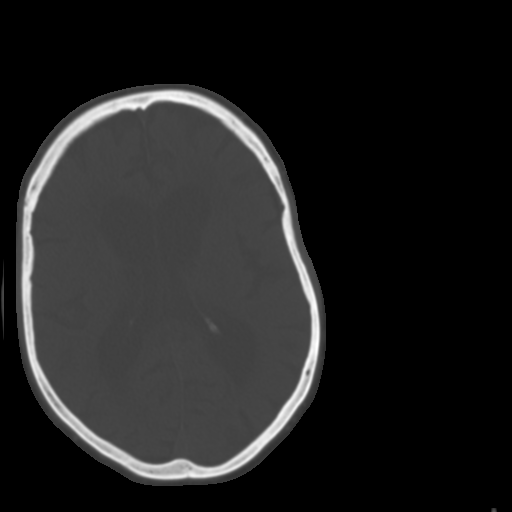
[im 20/32  brain]
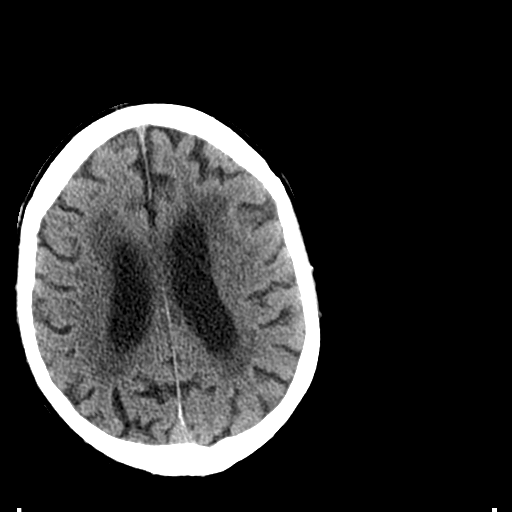
[im 23/32  brain]
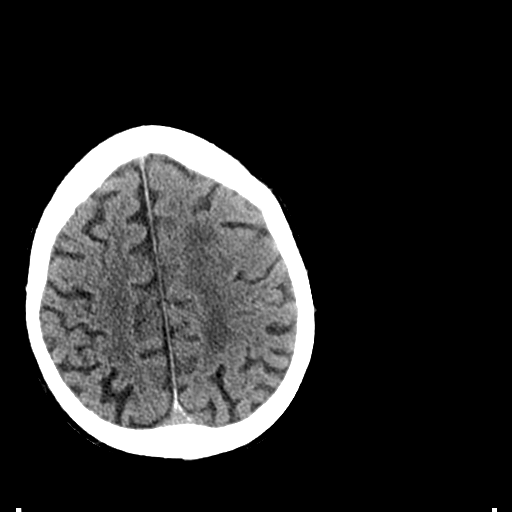
[im 26/32  brain]
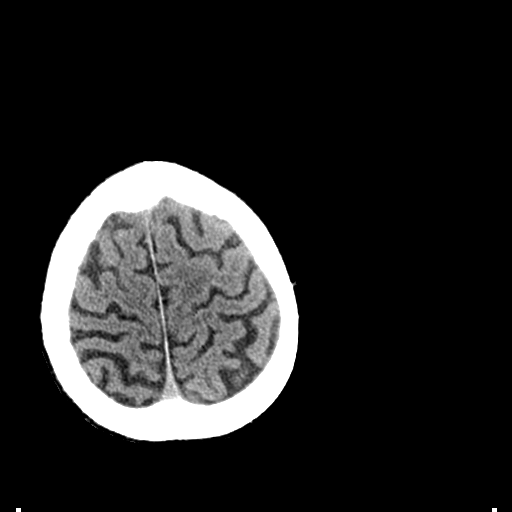
[im 29/32  brain]
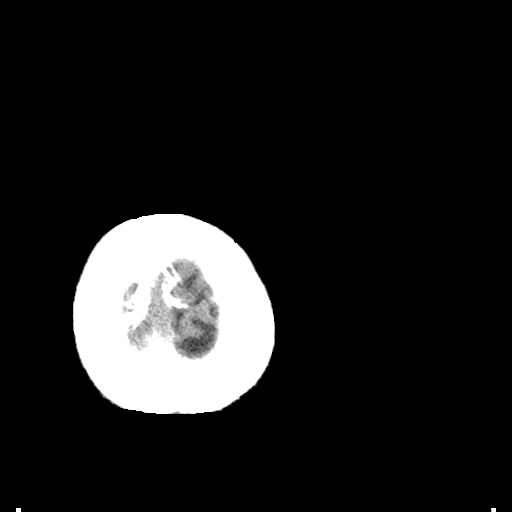
[im 29/32  bone]
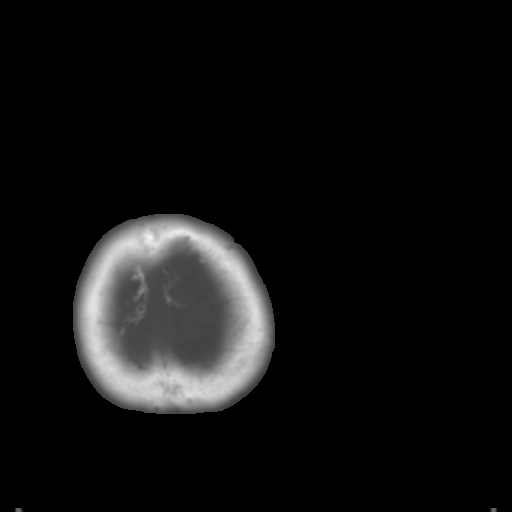

[Series 4: coronal soft tissue · coronal · 0.30mm/px · 3 of 66 slices shown]
[im 22/66  brain]
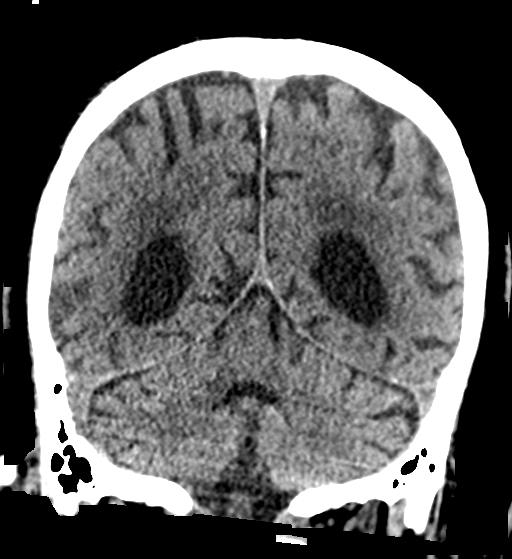
[im 29/66  brain]
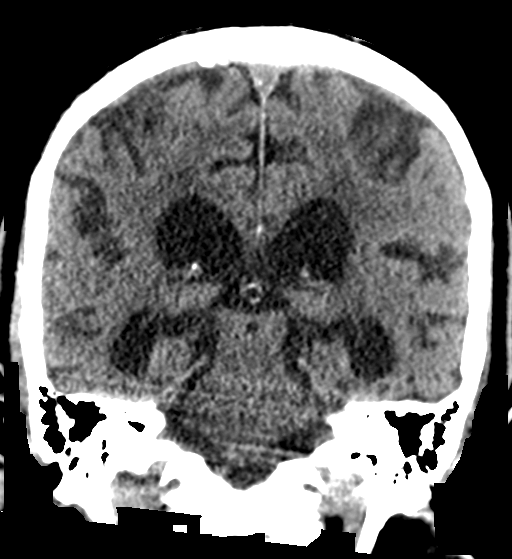
[im 37/66  brain]
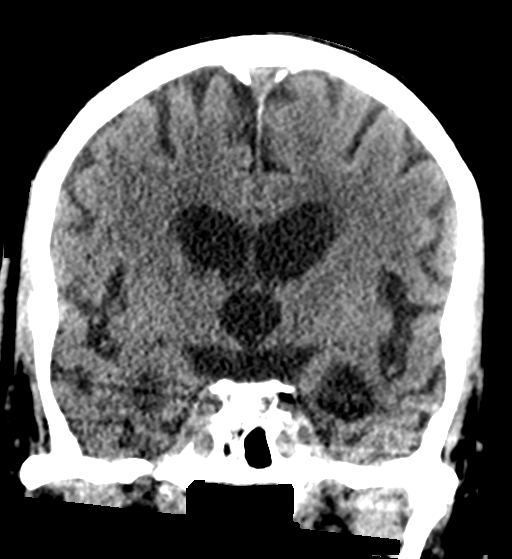

[Series 5: sagittal soft tissue · sagittal · 0.32mm/px · 3 of 49 slices shown]
[im 17/49  brain]
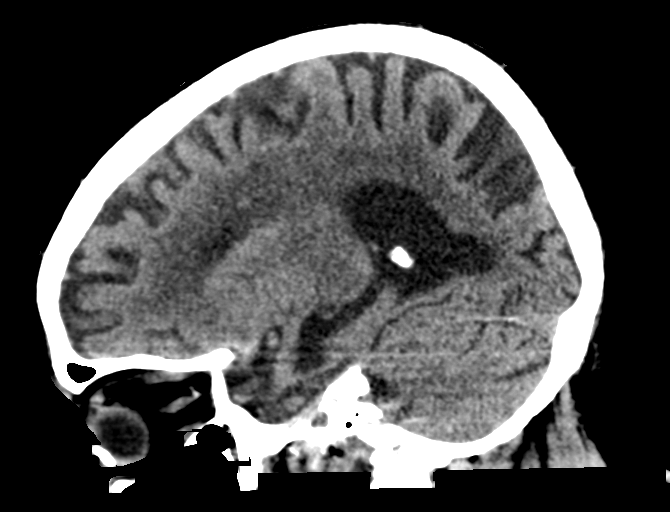
[im 25/49  brain]
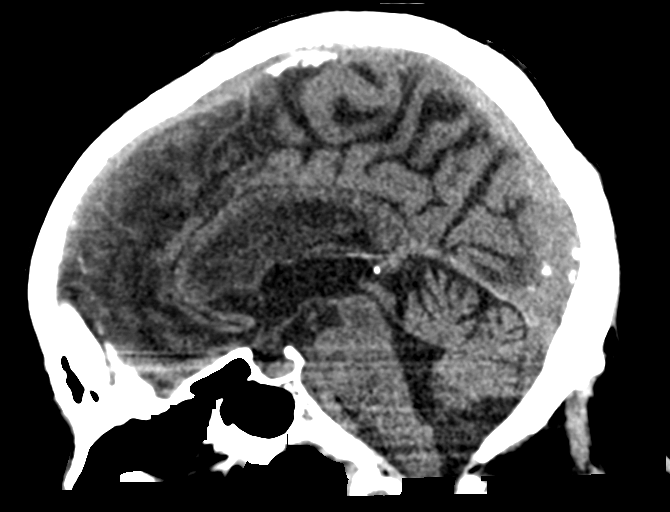
[im 33/49  brain]
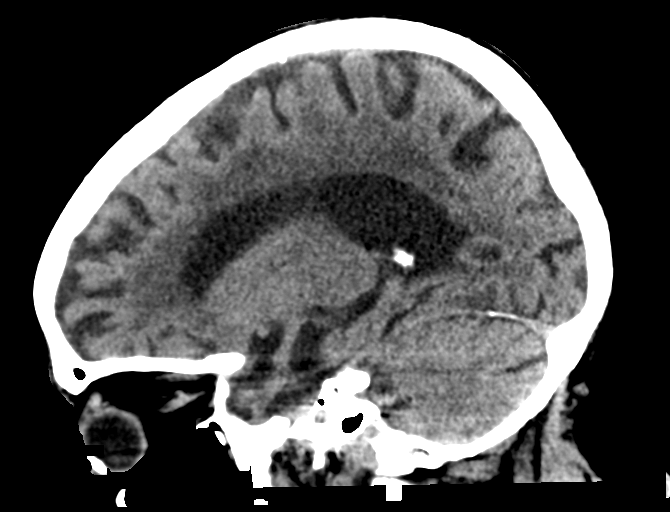

[15 of 47 positions shown; findings below may reference images not displayed]

FINDINGS: Brain: There is atrophy and chronic small vessel disease changes. No
acute intracranial abnormality. Specifically, no hemorrhage,
hydrocephalus, mass lesion, acute infarction, or significant
intracranial injury.

Vascular: No hyperdense vessel or unexpected calcification.

Skull: No acute calvarial abnormality.

Sinuses/Orbits: No acute findings

Other: None
IMPRESSION: Atrophy, chronic microvascular disease.

No acute intracranial abnormality.

## 2022-09-02 ENCOUNTER — Ambulatory Visit (HOSPITAL_COMMUNITY)
Admission: RE | Admit: 2022-09-02 | Discharge: 2022-09-02 | Disposition: A | Discharge status: Home / Self Care | Payer: Medicare Other | Source: Ambulatory Visit | Attending: Physician Assistant | Admitting: Physician Assistant

## 2022-09-02 ENCOUNTER — Ambulatory Visit (INDEPENDENT_AMBULATORY_CARE_PROVIDER_SITE_OTHER): Payer: Medicare Other

## 2022-09-02 ENCOUNTER — Encounter (HOSPITAL_COMMUNITY): Payer: Self-pay

## 2022-09-02 VITALS — BP 120/69 | HR 99 | Temp 98.2°F | Resp 18

## 2022-09-02 DIAGNOSIS — J9801 Acute bronchospasm: Secondary | ICD-10-CM | POA: Diagnosis not present

## 2022-09-02 DIAGNOSIS — J40 Bronchitis, not specified as acute or chronic: Secondary | ICD-10-CM | POA: Diagnosis not present

## 2022-09-02 DIAGNOSIS — J329 Chronic sinusitis, unspecified: Secondary | ICD-10-CM | POA: Diagnosis not present

## 2022-09-02 DIAGNOSIS — R059 Cough, unspecified: Secondary | ICD-10-CM | POA: Diagnosis not present

## 2022-09-02 LAB — POCT FASTING CBG KUC MANUAL ENTRY: POCT Glucose (KUC): 140 mg/dL — AB (ref 70–99)

## 2022-09-02 MED ORDER — IPRATROPIUM-ALBUTEROL 0.5-2.5 (3) MG/3ML IN SOLN
RESPIRATORY_TRACT | Status: AC
  Filled 2022-09-02: qty 3

## 2022-09-02 MED ORDER — IPRATROPIUM-ALBUTEROL 0.5-2.5 (3) MG/3ML IN SOLN
3.0000 mL | Freq: Once | RESPIRATORY_TRACT | Status: AC
  Administered 2022-09-02: 3 mL via RESPIRATORY_TRACT

## 2022-09-02 MED ORDER — AZITHROMYCIN 250 MG PO TABS: 250.0000 mg | ORAL_TABLET | Freq: Every day | ORAL | 0 refills | Status: DC

## 2022-09-02 MED ORDER — PREDNISONE 10 MG (21) PO TBPK: ORAL_TABLET | ORAL | 0 refills | Status: DC

## 2022-09-02 NOTE — ED Provider Notes (Signed)
[facility]    CSN: [id number] Arrival date & time: 09/02/22  1012      History   Chief Complaint Chief Complaint  Patient presents with   Cough    Coughing  1 month - Entered by patient    HPI Grant Jensen. is a 89 y.o. male.   Patient presents today with a month plus long history of severe cough.  He has been treated by his primary care at [facility] physicians several times for similar symptoms.  His caregiver could not remember when he was seen or what medications were given but I called the pharmacy and was able to find out that he was prescribed a 10-day course of doxycycline on May 28 and a 5-day course of prednisone on 08/11/2022.  He has been using DuoNebs regularly without improvement of symptoms.  Reports that generally he gets symptoms approximately once per year but denies any formal diagnosis of chronic lung condition including asthma or COPD.  He does not smoke.  Denies any previous cardiothoracic surgery.  Denies any additional symptoms including fever, chest pain, nausea, vomiting, diarrhea.  Denies any known sick contacts.  They have not been taking any over-the-counter medications for symptom management.  He is now having trouble sleeping as a result of the severe cough and shortness of breath.    Past Medical History:  Diagnosis Date   Blindness    Dementia (HCC)    Diabetes mellitus without complication (HCC)    Glaucoma    Hypertension     Patient Active Problem List   Diagnosis Date Noted   Small vessel cerebrovascular accident (CVA) (HCC) 04/14/2022   Acute CVA (cerebrovascular accident) (HCC) 04/09/2022   Rhabdomyolysis 05/01/2020   Dementia (HCC) 05/01/2020   Fall at home, initial encounter 05/01/2020   Diabetes (HCC) 05/01/2020   Essential hypertension 05/01/2020   NICM (nonischemic cardiomyopathy) (HCC) 05/01/2020   Chest pain with high risk for cardiac etiology 06/20/2014    Past Surgical History:  Procedure Laterality Date   LEFT  HEART CATHETERIZATION WITH CORONARY ANGIOGRAM N/A 06/22/2014   Procedure: LEFT HEART CATHETERIZATION WITH CORONARY ANGIOGRAM;  Surgeon: Yates Decamp, MD;  Location: [facility] CATH LAB;  Service: Cardiovascular;  Laterality: N/A;       Home Medications    Prior to Admission medications   Medication Sig Start Date End Date Taking? Authorizing Provider  azithromycin (ZITHROMAX) 250 MG tablet Take 1 tablet (250 mg total) by mouth daily. Take first 2 tablets together, then 1 every day until finished. 09/02/22  Yes Tiarah Shisler K, PA-C  predniSONE (STERAPRED UNI-PAK 21 TAB) 10 MG (21) TBPK tablet As directed 09/02/22  Yes [place] K, PA-C  acetaminophen (TYLENOL) 325 MG tablet Take 2 tablets (650 mg total) by mouth every 4 (four) hours as needed for mild pain (or temp > 37.5 C (99.5 F)). 04/18/22   Angiulli, Mcarthur Rossetti, PA-C  albuterol (VENTOLIN HFA) 108 (90 Base) MCG/ACT inhaler Inhale 2 puffs into the lungs every 4 (four) hours as needed for wheezing or shortness of breath. 12/06/21   [provider]  clopidogrel (PLAVIX) 75 MG tablet Take 1 tablet (75 mg total) by mouth daily. 04/18/22   Angiulli, Mcarthur Rossetti, PA-C  co-enzyme Q-10 30 MG capsule Take 7 capsules (210 mg total) by mouth 3 (three) times daily. 06/06/22   Micki Riley, MD  donepezil (ARICEPT) 10 MG tablet Take 1 tablet (10 mg total) by mouth at bedtime. 06/06/22   Delia Heady  S, MD  glimepiride (AMARYL) 1 MG tablet Take 1 tablet (1 mg total) by mouth every morning. 04/18/22   Angiulli, Mcarthur Rossetti, PA-C  ipratropium-albuterol (DUONEB) 0.5-2.5 (3) MG/3ML SOLN Take 3 mLs by nebulization every 6 (six) hours as needed (SOB). 12/09/21   [provider]  isosorbide mononitrate (IMDUR) 30 MG 24 hr tablet Take 1 tablet (30 mg total) by mouth daily. 04/19/22   Angiulli, Mcarthur Rossetti, PA-C  latanoprost (XALATAN) 0.005 % ophthalmic solution Place 1 drop into both eyes at bedtime. 10/19/21   [provider]  melatonin 3 MG TABS tablet Take 1  tablet (3 mg total) by mouth at bedtime. 04/18/22   Angiulli, Mcarthur Rossetti, PA-C  metFORMIN (GLUCOPHAGE) 500 MG tablet Take 1 tablet (500 mg total) by mouth daily with breakfast. 04/18/22   Angiulli, Mcarthur Rossetti, PA-C  pantoprazole (PROTONIX) 40 MG tablet Take 1 tablet (40 mg total) by mouth daily. 04/18/22 04/18/23  Angiulli, Mcarthur Rossetti, PA-C  rosuvastatin (CRESTOR) 20 MG tablet Take 1 tablet (20 mg total) by mouth daily. 04/18/22   Angiulli, Mcarthur Rossetti, PA-C  timolol (BETIMOL) 0.5 % ophthalmic solution Place 1 drop into the left eye 2 (two) times daily.    [provider]  traZODone (DESYREL) 50 MG tablet Take 50 mg by mouth at bedtime.    [provider]    Family History History reviewed. No pertinent family history.  Social History Social History   Tobacco Use   Smoking status: Never  Vaping Use   Vaping Use: Never used  Substance Use Topics   Alcohol use: Not Currently   Drug use: Not Currently     Allergies   Penicillins, Lisinopril, and Penicillin g   Review of Systems Review of Systems  Constitutional:  Positive for activity change. Negative for appetite change, fatigue and fever.  HENT:  Negative for congestion, sinus pressure, sneezing and sore throat.   Respiratory:  Positive for cough, chest tightness, shortness of breath and wheezing.   Cardiovascular:  Negative for chest pain.  Gastrointestinal:  Negative for abdominal pain, diarrhea, nausea and vomiting.  Neurological:  Negative for dizziness, light-headedness and headaches.     Physical Exam Triage Vital Signs ED Triage Vitals  Enc Vitals Group     BP 09/02/22 1052 (!) 152/82     Pulse Rate 09/02/22 1052 99     Resp 09/02/22 1052 (!) 36     Temp 09/02/22 1052 98.2 F (36.8 C)     Temp Source 09/02/22 1052 Oral     SpO2 09/02/22 1052 97 %     Weight --      Height --      Head Circumference --      Peak Flow --      Pain Score 09/02/22 1050 0     Pain Loc --      Pain Edu? --      Excl. in  GC? --    No data found.  Updated Vital Signs BP 120/69 (BP Location: Right Arm)   Pulse 99   Temp 98.2 F (36.8 C) (Oral)   Resp 18   SpO2 97%   Visual Acuity Right Eye Distance:   Left Eye Distance:   Bilateral Distance:    Right Eye Near:   Left Eye Near:    Bilateral Near:     Physical Exam Vitals reviewed.  Constitutional:      General: He is awake.     Appearance: Normal appearance. He  is well-developed. He is not ill-appearing.     Comments: Very pleasant male appears stated age in no acute distress   HENT:     Head: Normocephalic and atraumatic.     Right Ear: External ear normal.     Left Ear: External ear normal.     Nose: Nose normal.     Mouth/Throat:     Pharynx: Uvula midline. No oropharyngeal exudate or posterior oropharyngeal erythema.  Cardiovascular:     Rate and Rhythm: Normal rate and regular rhythm.     Heart sounds: Normal heart sounds, S1 normal and S2 normal. No murmur heard. Pulmonary:     Effort: Pulmonary effort is normal. Tachypnea present. No accessory muscle usage or respiratory distress.     Breath sounds: No stridor. Wheezing and rhonchi present. No rales.  Neurological:     Mental Status: He is alert.  Psychiatric:        Behavior: Behavior is cooperative.      UC Treatments / Results  Labs (all labs ordered are listed, but only abnormal results are displayed) Labs Reviewed  POCT FASTING CBG [facility] MANUAL ENTRY - Abnormal; Notable for the following components:      Result Value   POCT Glucose ([facility]) 140 (*)    All other components within normal limits    EKG   Radiology DG Chest 2 View  Result Date: 09/02/2022 CLINICAL DATA:  Worsening cough. EXAM: CHEST - 2 VIEW COMPARISON:  Chest radiograph 08/09/2022 FINDINGS: The cardiomediastinal silhouette is unchanged with normal heart size. Lung volumes are lower than on the prior study. There is mild peribronchial thickening. No overt pulmonary edema, confluent airspace opacity,  pleural effusion, or pneumothorax is identified. No acute osseous abnormality is seen. IMPRESSION: Mild bronchitic changes. Electronically Signed   By: Sebastian Ache M.D.   On: 09/02/2022 11:58    Procedures Procedures (including critical care time)  Medications Ordered in UC Medications  ipratropium-albuterol (DUONEB) 0.5-2.5 (3) MG/3ML nebulizer solution 3 mL (3 mLs Nebulization Given 09/02/22 1110)    Initial Impression / Assessment and Plan / UC Course  I have reviewed the triage vital signs and the nursing notes.  Pertinent labs & imaging results that were available during my care of the patient were reviewed by me and considered in my medical decision making (see chart for details).     Patient was initially tachypneic but this improved significantly after DuoNeb in clinic.  We did discuss potential utility of going to the emergency room initially but wife prefer to try medication here.  He did have significant improvement with DuoNeb and so we discussed the importance of using this every 4-6 hours on a scheduled basis to help with breathing and shortness of breath.  X-ray was repeated that showed mild bronchitic type changes.  His vital signs were stable with oxygen saturation of 97%.  He was treated with prednisone taper.  He had failed prednisone burst earlier this month.  We did discuss that this can raise his blood sugar significantly and he should avoid carbohydrates and drink lots of fluid while on this medicine.  His blood sugar in clinic was 140.  We also discussed that it can impact his vision related to glaucoma but he is already blind as result of diabetic retinopathy and glaucoma.  Will start azithromycin.  We discussed that if his symptoms persist he would need to go to the emergency room immediately.  Discussed that wife should have a very low threshold for take  him to the ER including increasing cough, shortness of breath, chest discomfort, fever.  Assuming that he improves with  medication he should follow-up with his primary care on Monday (09/04/2022) and if he cannot see them he is to return here for reevaluation.  All questions were answered to patient and wife satisfaction.  Final Clinical Impressions(s) / UC Diagnoses   Final diagnoses:  Sinobronchitis  Bronchospasm     Discharge Instructions      His x-ray did not show any evidence of pneumonia.  He responded well to the medication.  Please make sure he is taking breathing treatments every 4-6 hours.  Start prednisone as prescribed.  Do not take NSAIDs with this medication including aspirin, ibuprofen/Advil, naproxen/Aleve.  This is going to raise his blood sugar so please monitor his blood sugar closely.  Make sure that he is drinking lots of water and avoid carbohydrates.  If he has very high blood sugar with shortness of breath, confusion, nausea, vomiting he needs to go to the emergency room.  Start azithromycin to cover for infection.  He is very important that he follows up with his primary care first thing next week.  If he cannot see them he should return here.  If he has recurrent coughing fits and is not responding to medication, chest pain, shortness of breath he needs to go to the emergency room immediately.     ED Prescriptions     Medication Sig Dispense Auth. Provider   predniSONE (STERAPRED UNI-PAK 21 TAB) 10 MG (21) TBPK tablet As directed 21 tablet Maryclaire Stoecker K, PA-C   azithromycin (ZITHROMAX) 250 MG tablet Take 1 tablet (250 mg total) by mouth daily. Take first 2 tablets together, then 1 every day until finished. 6 tablet Romen Yutzy, Noberto Retort, PA-C      PDMP not reviewed this encounter.   Jeani Hawking, PA-C 09/02/22 1246

## 2022-09-02 NOTE — ED Triage Notes (Signed)
Coughing up clear, thick phlegm in treatment room.  Congested, raspy breath sounds audible across the room.  Wife/caregiver reports pcp had him on antibiotic and prednisone and has completed the course of both and no real improvement.  Symptoms for a month.  Patient is an alzheimer's patient.

## 2022-09-02 NOTE — Discharge Instructions (Signed)
His x-ray did not show any evidence of pneumonia.  He responded well to the medication.  Please make sure he is taking breathing treatments every 4-6 hours.  Start prednisone as prescribed.  Do not take NSAIDs with this medication including aspirin, ibuprofen/Advil, naproxen/Aleve.  This is going to raise his blood sugar so please monitor his blood sugar closely.  Make sure that he is drinking lots of water and avoid carbohydrates.  If he has very high blood sugar with shortness of breath, confusion, nausea, vomiting he needs to go to the emergency room.  Start azithromycin to cover for infection.  He is very important that he follows up with his primary care first thing next week.  If he cannot see them he should return here.  If he has recurrent coughing fits and is not responding to medication, chest pain, shortness of breath he needs to go to the emergency room immediately.

## 2022-09-12 DIAGNOSIS — J470 Bronchiectasis with acute lower respiratory infection: Secondary | ICD-10-CM | POA: Diagnosis not present

## 2022-09-12 DIAGNOSIS — R051 Acute cough: Secondary | ICD-10-CM | POA: Diagnosis not present

## 2022-11-10 ENCOUNTER — Inpatient Hospital Stay (HOSPITAL_COMMUNITY)
Admission: EM | Admit: 2022-11-10 | Discharge: 2022-11-14 | DRG: 291 | Disposition: A | Discharge status: Home / Self Care | Payer: Medicare Other | Attending: Internal Medicine | Admitting: Internal Medicine

## 2022-11-10 ENCOUNTER — Other Ambulatory Visit: Payer: Self-pay

## 2022-11-10 ENCOUNTER — Emergency Department (HOSPITAL_COMMUNITY): Payer: Medicare Other

## 2022-11-10 DIAGNOSIS — D509 Iron deficiency anemia, unspecified: Secondary | ICD-10-CM | POA: Diagnosis present

## 2022-11-10 DIAGNOSIS — N179 Acute kidney failure, unspecified: Secondary | ICD-10-CM

## 2022-11-10 DIAGNOSIS — N1832 Chronic kidney disease, stage 3b: Secondary | ICD-10-CM | POA: Diagnosis present

## 2022-11-10 DIAGNOSIS — G309 Alzheimer's disease, unspecified: Secondary | ICD-10-CM | POA: Diagnosis present

## 2022-11-10 DIAGNOSIS — I130 Hypertensive heart and chronic kidney disease with heart failure and stage 1 through stage 4 chronic kidney disease, or unspecified chronic kidney disease: Secondary | ICD-10-CM | POA: Diagnosis not present

## 2022-11-10 DIAGNOSIS — F0390 Unspecified dementia without behavioral disturbance: Secondary | ICD-10-CM | POA: Diagnosis present

## 2022-11-10 DIAGNOSIS — E876 Hypokalemia: Secondary | ICD-10-CM | POA: Diagnosis present

## 2022-11-10 DIAGNOSIS — E1169 Type 2 diabetes mellitus with other specified complication: Secondary | ICD-10-CM | POA: Diagnosis present

## 2022-11-10 DIAGNOSIS — E1122 Type 2 diabetes mellitus with diabetic chronic kidney disease: Secondary | ICD-10-CM | POA: Diagnosis present

## 2022-11-10 DIAGNOSIS — Z66 Do not resuscitate: Secondary | ICD-10-CM | POA: Diagnosis present

## 2022-11-10 DIAGNOSIS — Z7984 Long term (current) use of oral hypoglycemic drugs: Secondary | ICD-10-CM

## 2022-11-10 DIAGNOSIS — I10 Essential (primary) hypertension: Secondary | ICD-10-CM | POA: Diagnosis present

## 2022-11-10 DIAGNOSIS — R0602 Shortness of breath: Secondary | ICD-10-CM | POA: Diagnosis not present

## 2022-11-10 DIAGNOSIS — Z79899 Other long term (current) drug therapy: Secondary | ICD-10-CM

## 2022-11-10 DIAGNOSIS — Z7902 Long term (current) use of antithrombotics/antiplatelets: Secondary | ICD-10-CM

## 2022-11-10 DIAGNOSIS — Z8673 Personal history of transient ischemic attack (TIA), and cerebral infarction without residual deficits: Secondary | ICD-10-CM

## 2022-11-10 DIAGNOSIS — E1165 Type 2 diabetes mellitus with hyperglycemia: Secondary | ICD-10-CM | POA: Diagnosis present

## 2022-11-10 DIAGNOSIS — Z880 Allergy status to penicillin: Secondary | ICD-10-CM

## 2022-11-10 DIAGNOSIS — E785 Hyperlipidemia, unspecified: Secondary | ICD-10-CM | POA: Diagnosis present

## 2022-11-10 DIAGNOSIS — I509 Heart failure, unspecified: Secondary | ICD-10-CM

## 2022-11-10 DIAGNOSIS — I5033 Acute on chronic diastolic (congestive) heart failure: Secondary | ICD-10-CM | POA: Diagnosis present

## 2022-11-10 DIAGNOSIS — H547 Unspecified visual loss: Secondary | ICD-10-CM | POA: Diagnosis present

## 2022-11-10 DIAGNOSIS — G9341 Metabolic encephalopathy: Secondary | ICD-10-CM | POA: Diagnosis present

## 2022-11-10 DIAGNOSIS — I5032 Chronic diastolic (congestive) heart failure: Secondary | ICD-10-CM | POA: Diagnosis present

## 2022-11-10 DIAGNOSIS — I452 Bifascicular block: Secondary | ICD-10-CM | POA: Diagnosis present

## 2022-11-10 DIAGNOSIS — Z888 Allergy status to other drugs, medicaments and biological substances status: Secondary | ICD-10-CM

## 2022-11-10 DIAGNOSIS — F0280 Dementia in other diseases classified elsewhere without behavioral disturbance: Secondary | ICD-10-CM | POA: Diagnosis present

## 2022-11-10 LAB — CBC
HCT: 30 % — ABNORMAL LOW (ref 39.0–52.0)
Hemoglobin: 9.4 g/dL — ABNORMAL LOW (ref 13.0–17.0)
MCH: 23.9 pg — ABNORMAL LOW (ref 26.0–34.0)
MCHC: 31.3 g/dL (ref 30.0–36.0)
MCV: 76.1 fL — ABNORMAL LOW (ref 80.0–100.0)
Platelets: 193 10*3/uL (ref 150–400)
RBC: 3.94 MIL/uL — ABNORMAL LOW (ref 4.22–5.81)
RDW: 14.2 % (ref 11.5–15.5)
WBC: 6.9 10*3/uL (ref 4.0–10.5)
nRBC: 0 % (ref 0.0–0.2)

## 2022-11-10 LAB — BASIC METABOLIC PANEL
Anion gap: 15 (ref 5–15)
BUN: 30 mg/dL — ABNORMAL HIGH (ref 8–23)
CO2: 18 mmol/L — ABNORMAL LOW (ref 22–32)
Calcium: 8.8 mg/dL — ABNORMAL LOW (ref 8.9–10.3)
Chloride: 103 mmol/L (ref 98–111)
Creatinine, Ser: 1.93 mg/dL — ABNORMAL HIGH (ref 0.61–1.24)
GFR, Estimated: 32 mL/min — ABNORMAL LOW (ref >60)
Glucose, Bld: 296 mg/dL — ABNORMAL HIGH (ref 70–99)
Potassium: 4.2 mmol/L (ref 3.5–5.1)
Sodium: 136 mmol/L (ref 135–145)

## 2022-11-10 LAB — CBG MONITORING, ED: Glucose-Capillary: 265 mg/dL — ABNORMAL HIGH (ref 70–99)

## 2022-11-10 LAB — TROPONIN I (HIGH SENSITIVITY)
Troponin I (High Sensitivity): 27 ng/L — ABNORMAL HIGH (ref <18)
Troponin I (High Sensitivity): 24 ng/L — ABNORMAL HIGH (ref <18)

## 2022-11-10 LAB — LIPASE, BLOOD: Lipase: 45 U/L (ref 11–51)

## 2022-11-10 NOTE — ED Triage Notes (Signed)
Pt arrives to ED c/o substernal, right sided CP and abdominal pain x 3 days. Pt endorses diarrhea and non-productive cough. No SOB or Nausea reported.

## 2022-11-11 ENCOUNTER — Encounter (HOSPITAL_COMMUNITY): Payer: Self-pay | Admitting: Family Medicine

## 2022-11-11 ENCOUNTER — Inpatient Hospital Stay (HOSPITAL_COMMUNITY): Payer: Medicare Other

## 2022-11-11 DIAGNOSIS — Z7902 Long term (current) use of antithrombotics/antiplatelets: Secondary | ICD-10-CM | POA: Diagnosis not present

## 2022-11-11 DIAGNOSIS — I5033 Acute on chronic diastolic (congestive) heart failure: Secondary | ICD-10-CM | POA: Diagnosis present

## 2022-11-11 DIAGNOSIS — E1169 Type 2 diabetes mellitus with other specified complication: Secondary | ICD-10-CM | POA: Diagnosis present

## 2022-11-11 DIAGNOSIS — H547 Unspecified visual loss: Secondary | ICD-10-CM | POA: Diagnosis present

## 2022-11-11 DIAGNOSIS — Z8673 Personal history of transient ischemic attack (TIA), and cerebral infarction without residual deficits: Secondary | ICD-10-CM | POA: Diagnosis not present

## 2022-11-11 DIAGNOSIS — I5032 Chronic diastolic (congestive) heart failure: Secondary | ICD-10-CM | POA: Diagnosis present

## 2022-11-11 DIAGNOSIS — N179 Acute kidney failure, unspecified: Secondary | ICD-10-CM | POA: Diagnosis present

## 2022-11-11 DIAGNOSIS — E1122 Type 2 diabetes mellitus with diabetic chronic kidney disease: Secondary | ICD-10-CM | POA: Diagnosis present

## 2022-11-11 DIAGNOSIS — F0390 Unspecified dementia without behavioral disturbance: Secondary | ICD-10-CM

## 2022-11-11 DIAGNOSIS — E1165 Type 2 diabetes mellitus with hyperglycemia: Secondary | ICD-10-CM | POA: Diagnosis present

## 2022-11-11 DIAGNOSIS — D509 Iron deficiency anemia, unspecified: Secondary | ICD-10-CM | POA: Diagnosis present

## 2022-11-11 DIAGNOSIS — Z7984 Long term (current) use of oral hypoglycemic drugs: Secondary | ICD-10-CM | POA: Diagnosis not present

## 2022-11-11 DIAGNOSIS — I10 Essential (primary) hypertension: Secondary | ICD-10-CM

## 2022-11-11 DIAGNOSIS — F0280 Dementia in other diseases classified elsewhere without behavioral disturbance: Secondary | ICD-10-CM | POA: Diagnosis present

## 2022-11-11 DIAGNOSIS — I130 Hypertensive heart and chronic kidney disease with heart failure and stage 1 through stage 4 chronic kidney disease, or unspecified chronic kidney disease: Secondary | ICD-10-CM | POA: Diagnosis present

## 2022-11-11 DIAGNOSIS — R0609 Other forms of dyspnea: Secondary | ICD-10-CM | POA: Diagnosis not present

## 2022-11-11 DIAGNOSIS — Z66 Do not resuscitate: Secondary | ICD-10-CM | POA: Diagnosis present

## 2022-11-11 DIAGNOSIS — E785 Hyperlipidemia, unspecified: Secondary | ICD-10-CM | POA: Diagnosis present

## 2022-11-11 DIAGNOSIS — G9341 Metabolic encephalopathy: Secondary | ICD-10-CM | POA: Diagnosis present

## 2022-11-11 DIAGNOSIS — Z880 Allergy status to penicillin: Secondary | ICD-10-CM | POA: Diagnosis not present

## 2022-11-11 DIAGNOSIS — R0602 Shortness of breath: Secondary | ICD-10-CM | POA: Diagnosis present

## 2022-11-11 DIAGNOSIS — G309 Alzheimer's disease, unspecified: Secondary | ICD-10-CM | POA: Diagnosis present

## 2022-11-11 DIAGNOSIS — N1832 Chronic kidney disease, stage 3b: Secondary | ICD-10-CM | POA: Diagnosis present

## 2022-11-11 DIAGNOSIS — Z888 Allergy status to other drugs, medicaments and biological substances status: Secondary | ICD-10-CM | POA: Diagnosis not present

## 2022-11-11 DIAGNOSIS — Z79899 Other long term (current) drug therapy: Secondary | ICD-10-CM | POA: Diagnosis not present

## 2022-11-11 DIAGNOSIS — I452 Bifascicular block: Secondary | ICD-10-CM | POA: Diagnosis present

## 2022-11-11 DIAGNOSIS — E876 Hypokalemia: Secondary | ICD-10-CM | POA: Diagnosis present

## 2022-11-11 LAB — BASIC METABOLIC PANEL
Anion gap: 14 (ref 5–15)
BUN: 29 mg/dL — ABNORMAL HIGH (ref 8–23)
CO2: 20 mmol/L — ABNORMAL LOW (ref 22–32)
Calcium: 8.4 mg/dL — ABNORMAL LOW (ref 8.9–10.3)
Chloride: 102 mmol/L (ref 98–111)
Creatinine, Ser: 1.74 mg/dL — ABNORMAL HIGH (ref 0.61–1.24)
GFR, Estimated: 36 mL/min — ABNORMAL LOW (ref >60)
Glucose, Bld: 356 mg/dL — ABNORMAL HIGH (ref 70–99)
Potassium: 4.2 mmol/L (ref 3.5–5.1)
Sodium: 136 mmol/L (ref 135–145)

## 2022-11-11 LAB — HEMOGLOBIN A1C
Hgb A1c MFr Bld: 8.6 % — ABNORMAL HIGH (ref 4.8–5.6)
Mean Plasma Glucose: 200.12 mg/dL

## 2022-11-11 LAB — CBC
HCT: 30.3 % — ABNORMAL LOW (ref 39.0–52.0)
Hemoglobin: 9.4 g/dL — ABNORMAL LOW (ref 13.0–17.0)
MCH: 24.5 pg — ABNORMAL LOW (ref 26.0–34.0)
MCHC: 31 g/dL (ref 30.0–36.0)
MCV: 79.1 fL — ABNORMAL LOW (ref 80.0–100.0)
Platelets: 180 10*3/uL (ref 150–400)
RBC: 3.83 MIL/uL — ABNORMAL LOW (ref 4.22–5.81)
RDW: 14.2 % (ref 11.5–15.5)
WBC: 7.1 10*3/uL (ref 4.0–10.5)
nRBC: 0 % (ref 0.0–0.2)

## 2022-11-11 LAB — CBG MONITORING, ED
Glucose-Capillary: 333 mg/dL — ABNORMAL HIGH (ref 70–99)
Glucose-Capillary: 325 mg/dL — ABNORMAL HIGH (ref 70–99)

## 2022-11-11 LAB — BRAIN NATRIURETIC PEPTIDE: B Natriuretic Peptide: 174.3 pg/mL — ABNORMAL HIGH (ref 0.0–100.0)

## 2022-11-11 LAB — MAGNESIUM: Magnesium: 1.9 mg/dL (ref 1.7–2.4)

## 2022-11-11 LAB — GLUCOSE, CAPILLARY: Glucose-Capillary: 245 mg/dL — ABNORMAL HIGH (ref 70–99)

## 2022-11-11 MED ORDER — INSULIN ASPART 100 UNIT/ML IJ SOLN
0.0000 [IU] | Freq: Every day | INTRAMUSCULAR | Status: DC
  Administered 2022-11-11: 4 [IU] via SUBCUTANEOUS

## 2022-11-11 MED ORDER — FUROSEMIDE 10 MG/ML IJ SOLN
40.0000 mg | Freq: Two times a day (BID) | INTRAMUSCULAR | Status: DC
  Administered 2022-11-11: 40 mg via INTRAVENOUS
  Filled 2022-11-11: qty 4

## 2022-11-11 MED ORDER — MELATONIN 3 MG PO TABS
3.0000 mg | ORAL_TABLET | Freq: Every evening | ORAL | Status: DC | PRN
  Administered 2022-11-13: 3 mg via ORAL
  Filled 2022-11-11: qty 1

## 2022-11-11 MED ORDER — ISOSORBIDE MONONITRATE ER 30 MG PO TB24
30.0000 mg | ORAL_TABLET | Freq: Every day | ORAL | Status: DC
  Administered 2022-11-11 – 2022-11-14 (×4): 30 mg via ORAL
  Filled 2022-11-11 (×4): qty 1

## 2022-11-11 MED ORDER — HYDRALAZINE HCL 10 MG PO TABS
10.0000 mg | ORAL_TABLET | Freq: Three times a day (TID) | ORAL | Status: DC
  Administered 2022-11-11 – 2022-11-14 (×6): 10 mg via ORAL
  Filled 2022-11-11 (×7): qty 1

## 2022-11-11 MED ORDER — SODIUM CHLORIDE 0.9% FLUSH
3.0000 mL | Freq: Two times a day (BID) | INTRAVENOUS | Status: DC
  Administered 2022-11-11 – 2022-11-12 (×4): 3 mL via INTRAVENOUS

## 2022-11-11 MED ORDER — IPRATROPIUM-ALBUTEROL 0.5-2.5 (3) MG/3ML IN SOLN
3.0000 mL | Freq: Once | RESPIRATORY_TRACT | Status: AC
  Administered 2022-11-11: 3 mL via RESPIRATORY_TRACT
  Filled 2022-11-11: qty 3

## 2022-11-11 MED ORDER — HEPARIN SODIUM (PORCINE) 5000 UNIT/ML IJ SOLN
5000.0000 [IU] | Freq: Three times a day (TID) | INTRAMUSCULAR | Status: DC
  Administered 2022-11-11 – 2022-11-14 (×6): 5000 [IU] via SUBCUTANEOUS
  Filled 2022-11-11 (×7): qty 1

## 2022-11-11 MED ORDER — ACETAMINOPHEN 650 MG RE SUPP: 650.0000 mg | Freq: Four times a day (QID) | RECTAL | Status: DC | PRN

## 2022-11-11 MED ORDER — ROSUVASTATIN CALCIUM 20 MG PO TABS
20.0000 mg | ORAL_TABLET | Freq: Every day | ORAL | Status: DC
  Administered 2022-11-11 – 2022-11-14 (×4): 20 mg via ORAL
  Filled 2022-11-11 (×5): qty 1

## 2022-11-11 MED ORDER — GUAIFENESIN 100 MG/5ML PO LIQD
5.0000 mL | ORAL | Status: DC | PRN
  Administered 2022-11-11: 5 mL via ORAL
  Filled 2022-11-11: qty 10

## 2022-11-11 MED ORDER — CLOPIDOGREL BISULFATE 75 MG PO TABS
75.0000 mg | ORAL_TABLET | Freq: Every day | ORAL | Status: DC
  Administered 2022-11-11 – 2022-11-14 (×4): 75 mg via ORAL
  Filled 2022-11-11 (×4): qty 1

## 2022-11-11 MED ORDER — FUROSEMIDE 10 MG/ML IJ SOLN
60.0000 mg | Freq: Two times a day (BID) | INTRAMUSCULAR | Status: DC
  Administered 2022-11-11 – 2022-11-12 (×3): 60 mg via INTRAVENOUS
  Filled 2022-11-11 (×3): qty 6

## 2022-11-11 MED ORDER — SENNOSIDES-DOCUSATE SODIUM 8.6-50 MG PO TABS: 1.0000 | ORAL_TABLET | Freq: Every evening | ORAL | Status: DC | PRN

## 2022-11-11 MED ORDER — PANTOPRAZOLE SODIUM 40 MG PO TBEC
40.0000 mg | DELAYED_RELEASE_TABLET | Freq: Every day | ORAL | Status: DC
  Administered 2022-11-11 – 2022-11-14 (×4): 40 mg via ORAL
  Filled 2022-11-11 (×4): qty 1

## 2022-11-11 MED ORDER — ACETAMINOPHEN 325 MG PO TABS: 650.0000 mg | ORAL_TABLET | Freq: Four times a day (QID) | ORAL | Status: DC | PRN

## 2022-11-11 MED ORDER — INSULIN ASPART 100 UNIT/ML IJ SOLN
0.0000 [IU] | Freq: Three times a day (TID) | INTRAMUSCULAR | Status: DC
  Administered 2022-11-11 (×2): 4 [IU] via SUBCUTANEOUS
  Administered 2022-11-12: 3 [IU] via SUBCUTANEOUS
  Administered 2022-11-12: 2 [IU] via SUBCUTANEOUS

## 2022-11-11 NOTE — Hospital Course (Signed)
Mr. Farb was admitted to the hospital with the working diagnosis of heart failure decompensation.   89 yo male with the past medical history of hypertension, T2DM and Alzheimer's dementia who presented with dyspnea. Patient with several weeks of dyspnea and cough, treated as outpatient with systemic steroids with not much improvement in his symptoms. The night prior to admission his symptoms worsened, with increased work of breathing, prompting his wife to bring him to the ED. On his initial physical examination his blood pressure was 114/74, HR 74, RR 19, 02 saturation 97.6 Lungs with positive wheezing, no rhonchi, heart with S1 and S2 present and regular, abdomen with no distention, positive lower extremity edema.   Na 136, K 4,2 Cl 103, bicarbonate 18, glucose 296, bun 30 cr 1,93  BNP 174  High sensitive troponin 27 and 24  Wbc 6,9 hgb 9,4 plt 193   Chest radiograph with cardiomegaly with bilateral interstitial infiltrates, small bilateral pleural effusions.   EKG 83 bpm, left axis deviation, left anterior fascicular block, right bundle branch block, sinus rhythm with no significant ST segment or T wave changes.   Patient placed on furosemide for diuresis. Has been agitated and required bilateral mittens.   09/09 echocardiogram with preserved LV systolic function, improved volume status. Renal function not at baseline.  09/10 renal function improving, patient continue to be euvolemic.  Plan to continue diuresis at home with oral furosemide and follow up as outpatient.

## 2022-11-11 NOTE — Assessment & Plan Note (Addendum)
Echocardiogram with preserved LV systolic function with EF 60 to 65%, moderate LVH, RV systolic function preserved,   Patient was placed on IV furosemide for diuresis, negative fluid balance was achieved with significant improvement in his symptoms. During this hospitalization he had a 5 Kg, weight loss.   Isosorbide and hydralazine for after load reduction. Loop diuretic therapy with furosemide.   Limited pharmacologic therapy due to low GFR and risk of hypotension.

## 2022-11-11 NOTE — Assessment & Plan Note (Signed)
Patient has bilateral mittens in place. His wife is at the beside/  Continue neuro checks per unit protocol, he has a high risk for delirium

## 2022-11-11 NOTE — Assessment & Plan Note (Signed)
Renal function with improved cr from admission. Plan to continue diuresis, follow up renal function in am.  Avoid hypotension or nephrotoxic medications.

## 2022-11-11 NOTE — Assessment & Plan Note (Signed)
Continue with rosuvastatin and clopidogrel.  Patient will have a formal swallow evaluation prior to his discharge.  At home with soft diet to prevent aspiration.

## 2022-11-11 NOTE — ED Notes (Signed)
Iv pulled out by patient at this time and patient repositioned.

## 2022-11-11 NOTE — H&P (Signed)
History and Physical    Grant Jensen. NWG:[id number] DOB: 06-08-1928 DOA: 11/10/2022  PCP: Thana Ates, MD   Patient coming from: Home   Chief Complaint: SOB   HPI: Grant Jensen. is a 89 y.o. male with medical history significant for dementia, blindness, hypertension, and type 2 diabetes mellitus who presents to the emergency department for evaluation of shortness of breath.  Patient is accompanied by his wife who states that the patient has been progressively short of breath since June 2024.  He developed a cough and SOB in June, has been using breathing treatments and completed multiple courses of prednisone since then, seems to have some transient improvement at times, but has overall been worsening.  His breathing appeared to be labored at rest yesterday, prompting his presentation to the ED.  He has complained of abdominal pain intermittently, was complaining of pain in his right chest yesterday, and has had multiple loose stools since yesterday.  His wife has not noticed any melena or hematochezia, but states that patient was started on iron supplements ~2 weeks ago by his PCP.  ED Course: Upon arrival to the ED, patient is found to be afebrile and saturating well on room air with normal heart rate and stable blood pressure.  EKG demonstrates sinus rhythm with RBBB, LAFB, and LVH.  Labs are most notable for creatinine 1.93, glucose 296, hemoglobin 9.4, BNP 174, and troponin 27.  Review of Systems:  All other systems reviewed and apart from HPI, are negative.  Past Medical History:  Diagnosis Date   Blindness    Dementia (HCC)    Diabetes mellitus without complication (HCC)    Glaucoma    Hypertension     Past Surgical History:  Procedure Laterality Date   LEFT HEART CATHETERIZATION WITH CORONARY ANGIOGRAM N/A 06/22/2014   Procedure: LEFT HEART CATHETERIZATION WITH CORONARY ANGIOGRAM;  Surgeon: Yates Decamp, MD;  Location: [facility] CATH LAB;  Service: Cardiovascular;   Laterality: N/A;    Social History:   reports that he has never smoked. He does not have any smokeless tobacco history on file. He reports that he does not currently use alcohol. He reports that he does not currently use drugs.  Allergies  Allergen Reactions   Penicillins Other (See Comments)   Lisinopril Cough   Penicillin G Hives and Rash    History reviewed. No pertinent family history.   Prior to Admission medications   Medication Sig Start Date End Date Taking? Authorizing Provider  acetaminophen (TYLENOL) 325 MG tablet Take 2 tablets (650 mg total) by mouth every 4 (four) hours as needed for mild pain (or temp > 37.5 C (99.5 F)). 04/18/22   Angiulli, Mcarthur Rossetti, PA-C  albuterol (VENTOLIN HFA) 108 (90 Base) MCG/ACT inhaler Inhale 2 puffs into the lungs every 4 (four) hours as needed for wheezing or shortness of breath. 12/06/21   [provider]  azithromycin (ZITHROMAX) 250 MG tablet Take 1 tablet (250 mg total) by mouth daily. Take first 2 tablets together, then 1 every day until finished. 09/02/22   Raspet, Noberto Retort, PA-C  clopidogrel (PLAVIX) 75 MG tablet Take 1 tablet (75 mg total) by mouth daily. 04/18/22   Angiulli, Mcarthur Rossetti, PA-C  co-enzyme Q-10 30 MG capsule Take 7 capsules (210 mg total) by mouth 3 (three) times daily. 06/06/22   Micki Riley, MD  donepezil (ARICEPT) 10 MG tablet Take 1 tablet (10 mg total) by mouth at bedtime. 06/06/22   Micki Riley,  MD  glimepiride (AMARYL) 1 MG tablet Take 1 tablet (1 mg total) by mouth every morning. 04/18/22   Angiulli, Mcarthur Rossetti, PA-C  ipratropium-albuterol (DUONEB) 0.5-2.5 (3) MG/3ML SOLN Take 3 mLs by nebulization every 6 (six) hours as needed (SOB). 12/09/21   [provider]  isosorbide mononitrate (IMDUR) 30 MG 24 hr tablet Take 1 tablet (30 mg total) by mouth daily. 04/19/22   Angiulli, Mcarthur Rossetti, PA-C  latanoprost (XALATAN) 0.005 % ophthalmic solution Place 1 drop into both eyes at bedtime. 10/19/21   [provider]  melatonin 3 MG TABS tablet Take 1 tablet (3 mg total) by mouth at bedtime. 04/18/22   Angiulli, Mcarthur Rossetti, PA-C  metFORMIN (GLUCOPHAGE) 500 MG tablet Take 1 tablet (500 mg total) by mouth daily with breakfast. 04/18/22   Angiulli, Mcarthur Rossetti, PA-C  pantoprazole (PROTONIX) 40 MG tablet Take 1 tablet (40 mg total) by mouth daily. 04/18/22 04/18/23  Angiulli, Mcarthur Rossetti, PA-C  predniSONE (STERAPRED UNI-PAK 21 TAB) 10 MG (21) TBPK tablet As directed 09/02/22   Raspet, Erin K, PA-C  rosuvastatin (CRESTOR) 20 MG tablet Take 1 tablet (20 mg total) by mouth daily. 04/18/22   Angiulli, Mcarthur Rossetti, PA-C  timolol (BETIMOL) 0.5 % ophthalmic solution Place 1 drop into the left eye 2 (two) times daily.    [provider]  traZODone (DESYREL) 50 MG tablet Take 50 mg by mouth at bedtime.    [provider]    Physical Exam: Vitals:   11/11/22 0028 11/11/22 0030 11/11/22 0045 11/11/22 0100  BP: 114/74 115/86 90/68 105/81  Pulse: 74 71  89  Resp: 19 20 14    Temp: 97.6 F (36.4 C)     TempSrc: Oral     SpO2: 100% 100%  (!) 70%  Weight:      Height:        Constitutional: NAD, no pallor or diaphoresis   Eyes: PERTLA, lids and conjunctivae normal ENMT: Mucous membranes are moist. Posterior pharynx clear of any exudate or lesions.   Neck: supple, no masses  Respiratory: no wheezing, no crackles. No accessory muscle use.  Cardiovascular: S1 & S2 heard, regular rate and rhythm. Mild bilateral lower leg edema.  Abdomen: No distension, no tenderness, soft. Bowel sounds active.  Musculoskeletal: no clubbing / cyanosis. No joint deformity upper and lower extremities.   Skin: no significant rashes, lesions, ulcers. Warm, dry, well-perfused. Neurologic: No gross facial asymmetry, no aphasia or dysarthria. Moving all extremities. Alert and oriented to person only.  Psychiatric: Calm. Cooperative.    Labs and Imaging on Admission: I have personally reviewed following labs and imaging  studies  CBC: Recent Labs  Lab 11/10/22 2014  WBC 6.9  HGB 9.4*  HCT 30.0*  MCV 76.1*  PLT 193   Basic Metabolic Panel: Recent Labs  Lab 11/10/22 2014  NA 136  K 4.2  CL 103  CO2 18*  GLUCOSE 296*  BUN 30*  CREATININE 1.93*  CALCIUM 8.8*   GFR: Estimated Creatinine Clearance: 22.1 mL/min (A) (by C-G formula based on SCr of 1.93 mg/dL (H)). Liver Function Tests: No results for input(s): "AST", "ALT", "ALKPHOS", "BILITOT", "PROT", "ALBUMIN" in the last 168 hours. Recent Labs  Lab 11/10/22 2014  LIPASE 45   No results for input(s): "AMMONIA" in the last 168 hours. Coagulation Profile: No results for input(s): "INR", "PROTIME" in the last 168 hours. Cardiac Enzymes: No results for input(s): "CKTOTAL", "CKMB", "CKMBINDEX", "TROPONINI" in the last 168 hours. BNP (last 3  results) No results for input(s): "PROBNP" in the last 8760 hours. HbA1C: No results for input(s): "HGBA1C" in the last 72 hours. CBG: Recent Labs  Lab 11/10/22 2002  GLUCAP 265*   Lipid Profile: No results for input(s): "CHOL", "HDL", "LDLCALC", "TRIG", "CHOLHDL", "LDLDIRECT" in the last 72 hours. Thyroid Function Tests: No results for input(s): "TSH", "T4TOTAL", "FREET4", "T3FREE", "THYROIDAB" in the last 72 hours. Anemia Panel: No results for input(s): "VITAMINB12", "FOLATE", "FERRITIN", "TIBC", "IRON", "RETICCTPCT" in the last 72 hours. Urine analysis:    Component Value Date/Time   COLORURINE STRAW (A) 04/30/2021 0346   APPEARANCEUR CLEAR 04/30/2021 0346   LABSPEC 1.010 04/30/2021 0346   PHURINE 7.0 04/30/2021 0346   GLUCOSEU NEGATIVE 04/30/2021 0346   HGBUR NEGATIVE 04/30/2021 0346   BILIRUBINUR NEGATIVE 04/30/2021 0346   KETONESUR NEGATIVE 04/30/2021 0346   PROTEINUR NEGATIVE 04/30/2021 0346   NITRITE NEGATIVE 04/30/2021 0346   LEUKOCYTESUR NEGATIVE 04/30/2021 0346   Sepsis Labs: @LABRCNTIP (procalcitonin:4,lacticidven:4) )No results found for this or any previous visit (from the  past 240 hour(s)).   Radiological Exams on Admission: DG Chest 1 View  Result Date: 11/10/2022 CLINICAL DATA:  Substernal chest pain. EXAM: CHEST  1 VIEW COMPARISON:  September 02, 2022 FINDINGS: The heart size and mediastinal contours are within normal limits. Mild diffusely increased interstitial lung markings are seen. This is increased in severity when compared to the prior study. Mild perihilar prominence of the pulmonary vasculature is also noted. No pleural effusion or pneumothorax is identified. Multilevel degenerative changes seen throughout the thoracic spine. IMPRESSION: Mild pulmonary vascular congestion with mild interstitial edema. Electronically Signed   By: Aram Candela M.D.   On: 11/10/2022 21:12    EKG: Independently reviewed. Sinus rhythm, RBBB, LAFB.   Assessment/Plan   1. Acute on chronic diastolic CHF  - Diurese with IV Lasix, monitor weight and I/Os, monitor renal function and electrolytes    2. AKI superimposed on CKD 3B  - SCr is 1.93 on admission, up from baseline of ~1.5  - Check renal US, renally-dose medications, monitor closely while diuresing    3. Anemia  - Hgb is 9.4, down from 13.0 in February 2024  - No overt bleeding, was started on iron supplements ~2 wks ago per report of his wife    4. Hx of CVA  - Continue Crestor and Plavix   5. Dementia  - Delirium precautions   6. Type II DM  - A1c was 10.1% in February 2024  - Check CBGs and use SSI for now   7. Diarrhea  - Abdominal exam is benign, no fever or leukocytosis  - Supportive care    DVT prophylaxis: sq heparin  Code Status: DNR  Level of Care: Level of care: Telemetry Cardiac Family Communication: Wife at bedside  Disposition Plan:  Patient is from: home  Anticipated d/c is to: TBD Anticipated d/c date is: 11/14/22  Patient currently: Pending renal US, stable renal function, improved respiratory status  Consults called: None  Admission status: Inpatient     Briscoe Deutscher,  MD [facility]  11/11/2022, 2:45 AM

## 2022-11-11 NOTE — ED Notes (Signed)
Pt pulled off cardiac monitor, pulse oxygen saturation cord, and blood pressure cuff.

## 2022-11-11 NOTE — ED Notes (Addendum)
Blood obtained 

## 2022-11-11 NOTE — Assessment & Plan Note (Signed)
Hgb stable 

## 2022-11-11 NOTE — ED Notes (Signed)
ED TO INPATIENT HANDOFF REPORT  ED Nurse Name and Phone #: Makendra Vigeant [phone]  S Name/Age/Gender Grant Jensen. 89 y.o. male Room/Bed: 006C/006C  Code Status   Code Status: Limited: Do not attempt resuscitation (DNR) -DNR-LIMITED -Do Not Intubate/DNI   Home/SNF/Other Home Patient oriented to: self Is this baseline? Yes   Triage Complete: Triage complete  Chief Complaint Acute on chronic diastolic CHF (congestive heart failure) (HCC) [I50.33]  Triage Note Pt arrives to ED c/o substernal, right sided CP and abdominal pain x 3 days. Pt endorses diarrhea and non-productive cough. No SOB or Nausea reported.    Allergies Allergies  Allergen Reactions   Penicillins Other (See Comments)   Lisinopril Cough   Penicillin G Hives and Rash    Level of Care/Admitting Diagnosis ED Disposition     ED Disposition  Admit   Condition  --   Comment  Hospital Area: [facility] [[id number]]  Level of Care: Telemetry Cardiac [[id number]]  May admit patient to [facility] or [facility] if equivalent level of care is available:: Yes  Covid Evaluation: Asymptomatic - no recent exposure (last 10 days) testing not required  Diagnosis: Acute on chronic diastolic CHF (congestive heart failure) [employer]) [[id number]]  Admitting Physician: Briscoe Deutscher [[id number]]  Attending Physician: Briscoe Deutscher [[id number]]  Certification:: I certify this patient will need inpatient services for at least 2 midnights  Expected Medical Readiness: 11/13/2022          B Medical/Surgery History Past Medical History:  Diagnosis Date   Blindness    Dementia (HCC)    Diabetes mellitus without complication (HCC)    Glaucoma    Hypertension    Past Surgical History:  Procedure Laterality Date   LEFT HEART CATHETERIZATION WITH CORONARY ANGIOGRAM N/A 06/22/2014   Procedure: LEFT HEART CATHETERIZATION WITH CORONARY ANGIOGRAM;  Surgeon: Yates Decamp, MD;  Location: [facility] CATH LAB;  Service: Cardiovascular;   Laterality: N/A;     A IV Location/Drains/Wounds Patient Lines/Drains/Airways Status     Active Line/Drains/Airways     None            Intake/Output Last 24 hours No intake or output data in the 24 hours ending 11/11/22 1305  Labs/Imaging Results for orders placed or performed during the hospital encounter of 11/10/22 (from the past 48 hour(s))  CBG monitoring, ED     Status: Abnormal   Collection Time: 11/10/22  8:02 PM  Result Value Ref Range   Glucose-Capillary 265 (H) 70 - 99 mg/dL    Comment: Glucose reference range applies only to samples taken after fasting for at least 8 hours.  Basic metabolic panel     Status: Abnormal   Collection Time: 11/10/22  8:14 PM  Result Value Ref Range   Sodium 136 135 - 145 mmol/L   Potassium 4.2 3.5 - 5.1 mmol/L   Chloride 103 98 - 111 mmol/L   CO2 18 (L) 22 - 32 mmol/L   Glucose, Bld 296 (H) 70 - 99 mg/dL    Comment: Glucose reference range applies only to samples taken after fasting for at least 8 hours.   BUN 30 (H) 8 - 23 mg/dL   Creatinine, Ser 7.25 (H) 0.61 - 1.24 mg/dL   Calcium 8.8 (L) 8.9 - 10.3 mg/dL   GFR, Estimated 32 (L) >60 mL/min    Comment: (NOTE) Calculated using the CKD-EPI Creatinine Equation (2021)    Anion gap 15 5 - 15    Comment: Performed at  [facility], [address], [place], [place] [address]  CBC     Status: Abnormal   Collection Time: 11/10/22  8:14 PM  Result Value Ref Range   WBC 6.9 4.0 - 10.5 K/uL   RBC 3.94 (L) 4.22 - 5.81 MIL/uL   Hemoglobin 9.4 (L) 13.0 - 17.0 g/dL   HCT 78.4 (L) 69.6 - 29.5 %   MCV 76.1 (L) 80.0 - 100.0 fL   MCH 23.9 (L) 26.0 - 34.0 pg   MCHC 31.3 30.0 - 36.0 g/dL   RDW 28.4 13.2 - 44.0 %   Platelets 193 150 - 400 K/uL   nRBC 0.0 0.0 - 0.2 %    Comment: Performed at [facility], [address], [place], [place] [address]  Troponin I (High Sensitivity)     Status: Abnormal   Collection Time: 11/10/22  8:14 PM  Result Value Ref Range   Troponin I  (High Sensitivity) 27 (H) <18 ng/L    Comment: (NOTE) Elevated high sensitivity troponin I (hsTnI) values and significant  changes across serial measurements may suggest ACS but many other  chronic and acute conditions are known to elevate hsTnI results.  Refer to the "Links" section for chest pain algorithms and additional  guidance. Performed at [facility], [address], [place], [place] [address]   Lipase, blood     Status: None   Collection Time: 11/10/22  8:14 PM  Result Value Ref Range   Lipase 45 11 - 51 U/L    Comment: Performed at [facility], [address] [place], [place] [address]  Brain natriuretic peptide     Status: Abnormal   Collection Time: 11/10/22  8:14 PM  Result Value Ref Range   B Natriuretic Peptide 174.3 (H) 0.0 - 100.0 pg/mL    Comment: Performed at [facility], [address] [place], [place] [address]  Troponin I (High Sensitivity)     Status: Abnormal   Collection Time: 11/10/22 10:25 PM  Result Value Ref Range   Troponin I (High Sensitivity) 24 (H) <18 ng/L    Comment: (NOTE) Elevated high sensitivity troponin I (hsTnI) values and significant  changes across serial measurements may suggest ACS but many other  chronic and acute conditions are known to elevate hsTnI results.  Refer to the "Links" section for chest pain algorithms and additional  guidance. Performed at [facility], [address] [place], [place] [address]   Hemoglobin A1c     Status: Abnormal   Collection Time: 11/11/22  4:13 AM  Result Value Ref Range   Hgb A1c MFr Bld 8.6 (H) 4.8 - 5.6 %    Comment: (NOTE) Pre diabetes:          5.7%-6.4%  Diabetes:              >6.4%  Glycemic control for   <7.0% adults with diabetes    Mean Plasma Glucose 200.12 mg/dL    Comment: Performed at [facility], [address], [place], [place] [address]  Basic metabolic panel     Status: Abnormal   Collection Time: 11/11/22  4:13 AM  Result Value Ref  Range   Sodium 136 135 - 145 mmol/L   Potassium 4.2 3.5 - 5.1 mmol/L   Chloride 102 98 - 111 mmol/L   CO2 20 (L) 22 - 32 mmol/L   Glucose, Bld 356 (H) 70 - 99 mg/dL  Comment: Glucose reference range applies only to samples taken after fasting for at least 8 hours.   BUN 29 (H) 8 - 23 mg/dL   Creatinine, Ser 3.50 (H) 0.61 - 1.24 mg/dL   Calcium 8.4 (L) 8.9 - 10.3 mg/dL   GFR, Estimated 36 (L) >60 mL/min    Comment: (NOTE) Calculated using the CKD-EPI Creatinine Equation (2021)    Anion gap 14 5 - 15    Comment: Performed at [facility], [address] [place], [place] [address]  Magnesium     Status: None   Collection Time: 11/11/22  4:13 AM  Result Value Ref Range   Magnesium 1.9 1.7 - 2.4 mg/dL    Comment: Performed at [facility], [address] [place], [place] [address]  CBC     Status: Abnormal   Collection Time: 11/11/22  4:13 AM  Result Value Ref Range   WBC 7.1 4.0 - 10.5 K/uL   RBC 3.83 (L) 4.22 - 5.81 MIL/uL   Hemoglobin 9.4 (L) 13.0 - 17.0 g/dL   HCT 71.6 (L) 96.7 - 89.3 %   MCV 79.1 (L) 80.0 - 100.0 fL   MCH 24.5 (L) 26.0 - 34.0 pg   MCHC 31.0 30.0 - 36.0 g/dL   RDW 81.0 17.5 - 10.2 %   Platelets 180 150 - 400 K/uL   nRBC 0.0 0.0 - 0.2 %    Comment: Performed at [facility], [address] [place], [place] [address]  CBG monitoring, ED     Status: Abnormal   Collection Time: 11/11/22  7:42 AM  Result Value Ref Range   Glucose-Capillary 333 (H) 70 - 99 mg/dL    Comment: Glucose reference range applies only to samples taken after fasting for at least 8 hours.  CBG monitoring, ED     Status: Abnormal   Collection Time: 11/11/22 11:18 AM  Result Value Ref Range   Glucose-Capillary 325 (H) 70 - 99 mg/dL    Comment: Glucose reference range applies only to samples taken after fasting for at least 8 hours.   US RENAL  Result Date: 11/11/2022 CLINICAL DATA:  89 year old male with acute renal insufficiency. EXAM: RENAL / URINARY TRACT  ULTRASOUND COMPLETE COMPARISON:  None Available. FINDINGS: Right Kidney: Renal measurements: 9.7 x 4.4 x 4.9 cm = volume: 99 mL. Maintained cortical echogenicity and volume for age (image 40). No right hydronephrosis or renal mass. Left Kidney: Renal measurements: 8.6 x 5.0 x 4.2 cm = volume: 95 mL. Relatively maintained left renal cortical echogenicity and cortical volume (image 4). No left hydronephrosis or renal mass. Bladder: Appears normal for degree of bladder distention. Other: None. IMPRESSION: Normal for age ultrasound appearance of both kidneys and the urinary bladder. Electronically Signed   By: Odessa Fleming M.D.   On: 11/11/2022 06:21   DG Chest 1 View  Result Date: 11/10/2022 CLINICAL DATA:  Substernal chest pain. EXAM: CHEST  1 VIEW COMPARISON:  September 02, 2022 FINDINGS: The heart size and mediastinal contours are within normal limits. Mild diffusely increased interstitial lung markings are seen. This is increased in severity when compared to the prior study. Mild perihilar prominence of the pulmonary vasculature is also noted. No pleural effusion or pneumothorax is identified. Multilevel degenerative changes seen throughout the thoracic spine. IMPRESSION: Mild pulmonary vascular congestion with mild interstitial edema. Electronically Signed   By: Aram Candela M.D.   On: 11/10/2022 21:12    Pending Labs Unresulted Labs (From admission,  onward)     Start     Ordered   11/11/22 0500  Basic metabolic panel  Daily,   R      11/11/22 0225   11/11/22 0500  CBC  Daily,   R      11/11/22 0225            Vitals/Pain Today's Vitals   11/11/22 0744 11/11/22 0930 11/11/22 1045 11/11/22 1239  BP: 132/72 133/61 (!) 123/58 (!) 140/125  Pulse: 70 71 69 70  Resp: 16 19 20 18   Temp: 98 F (36.7 C) 98.1 F (36.7 C)  98.2 F (36.8 C)  TempSrc: Oral Oral  Oral  SpO2: 95% 94% 94% 100%  Weight:      Height:      PainSc:        Isolation Precautions No active  isolations  Medications Medications  isosorbide mononitrate (IMDUR) 24 hr tablet 30 mg (30 mg Oral Given 11/11/22 0926)  rosuvastatin (CRESTOR) tablet 20 mg (20 mg Oral Given 11/11/22 0926)  pantoprazole (PROTONIX) EC tablet 40 mg (40 mg Oral Given 11/11/22 0926)  clopidogrel (PLAVIX) tablet 75 mg (75 mg Oral Given 11/11/22 0926)  insulin aspart (novoLOG) injection 0-6 Units (4 Units Subcutaneous Given 11/11/22 1124)  insulin aspart (novoLOG) injection 0-5 Units (has no administration in time range)  furosemide (LASIX) injection 40 mg (40 mg Intravenous Not Given 11/11/22 0746)  heparin injection 5,000 Units (has no administration in time range)  sodium chloride flush (NS) 0.9 % injection 3 mL (3 mLs Intravenous Not Given 11/11/22 0927)  guaiFENesin (ROBITUSSIN) 100 MG/5ML liquid 5 mL (5 mLs Oral Given 11/11/22 0410)  melatonin tablet 3 mg (has no administration in time range)  acetaminophen (TYLENOL) tablet 650 mg (has no administration in time range)    Or  acetaminophen (TYLENOL) suppository 650 mg (has no administration in time range)  senna-docusate (Senokot-S) tablet 1 tablet (has no administration in time range)  ipratropium-albuterol (DUONEB) 0.5-2.5 (3) MG/3ML nebulizer solution 3 mL (3 mLs Nebulization Given 11/11/22 0059)    Mobility      Focused Assessments Cardiac Assessment Handoff:    Lab Results  Component Value Date   CKTOTAL 572 (H) 05/04/2020   No results found for: "DDIMER" Does the Patient currently have chest pain? No    R Recommendations: See Admitting Provider Note  Report given to:   Additional Notes: Please call spouse Carolan Clines for updates

## 2022-11-11 NOTE — Assessment & Plan Note (Signed)
Continue with isosorbide/ hydralazine.  Systolic blood pressure at the time of his discharge was 128 to 102 mmHg.

## 2022-11-11 NOTE — ED Notes (Addendum)
[place] at bedside attempting a bedside renal US.

## 2022-11-11 NOTE — Assessment & Plan Note (Addendum)
Uncontrolled T2DM with hyperglycemia.  Patient was placed on insulin sliding scale for glucose cover and monitoring. At the time of his discharge will resume his oral antihyperglycemic agents.   Fasting glucose today is 281 mg/dl.   Continue with statin therapy.

## 2022-11-11 NOTE — ED Notes (Signed)
Wife at bedside.

## 2022-11-11 NOTE — ED Provider Notes (Signed)
[place] EMERGENCY DEPARTMENT AT [facility] Provider Note   CSN: [id number] Arrival date & time: 11/10/22  1955     History  Chief Complaint  Patient presents with   Chest Pain   Level 5 caveat due to dementia  Grant Jensen. is a 89 y.o. male.  The history is provided by the patient and the spouse.  Patient with diabetes, hypertension, dementia presents with multiple complaints.  Most of the history is provided by the wife.  She reports that he has had cough and shortness of breath for several months.  She reports she has had multiple rounds of prednisone without relief.  He just started another course of prednisone  she reports it is not COVID.  She reports over the past day he has reported chest pain and abdominal pain.  She also reports she has had nonbloody diarrhea.  No vomiting.   Wife denies known history of asthma He is a non-smoker Past Medical History:  Diagnosis Date   Blindness    Dementia (HCC)    Diabetes mellitus without complication (HCC)    Glaucoma    Hypertension     Home Medications Prior to Admission medications   Medication Sig Start Date End Date Taking? Authorizing Provider  acetaminophen (TYLENOL) 325 MG tablet Take 2 tablets (650 mg total) by mouth every 4 (four) hours as needed for mild pain (or temp > 37.5 C (99.5 F)). 04/18/22   Angiulli, Mcarthur Rossetti, PA-C  albuterol (VENTOLIN HFA) 108 (90 Base) MCG/ACT inhaler Inhale 2 puffs into the lungs every 4 (four) hours as needed for wheezing or shortness of breath. 12/06/21   [provider]  azithromycin (ZITHROMAX) 250 MG tablet Take 1 tablet (250 mg total) by mouth daily. Take first 2 tablets together, then 1 every day until finished. 09/02/22   Raspet, Noberto Retort, PA-C  clopidogrel (PLAVIX) 75 MG tablet Take 1 tablet (75 mg total) by mouth daily. 04/18/22   Angiulli, Mcarthur Rossetti, PA-C  co-enzyme Q-10 30 MG capsule Take 7 capsules (210 mg total) by mouth 3 (three) times daily. 06/06/22   Micki Riley, MD  donepezil (ARICEPT) 10 MG tablet Take 1 tablet (10 mg total) by mouth at bedtime. 06/06/22   Micki Riley, MD  glimepiride (AMARYL) 1 MG tablet Take 1 tablet (1 mg total) by mouth every morning. 04/18/22   Angiulli, Mcarthur Rossetti, PA-C  ipratropium-albuterol (DUONEB) 0.5-2.5 (3) MG/3ML SOLN Take 3 mLs by nebulization every 6 (six) hours as needed (SOB). 12/09/21   [provider]  isosorbide mononitrate (IMDUR) 30 MG 24 hr tablet Take 1 tablet (30 mg total) by mouth daily. 04/19/22   Angiulli, Mcarthur Rossetti, PA-C  latanoprost (XALATAN) 0.005 % ophthalmic solution Place 1 drop into both eyes at bedtime. 10/19/21   [provider]  melatonin 3 MG TABS tablet Take 1 tablet (3 mg total) by mouth at bedtime. 04/18/22   Angiulli, Mcarthur Rossetti, PA-C  metFORMIN (GLUCOPHAGE) 500 MG tablet Take 1 tablet (500 mg total) by mouth daily with breakfast. 04/18/22   Angiulli, Mcarthur Rossetti, PA-C  pantoprazole (PROTONIX) 40 MG tablet Take 1 tablet (40 mg total) by mouth daily. 04/18/22 04/18/23  Angiulli, Mcarthur Rossetti, PA-C  predniSONE (STERAPRED UNI-PAK 21 TAB) 10 MG (21) TBPK tablet As directed 09/02/22   Raspet, Erin K, PA-C  rosuvastatin (CRESTOR) 20 MG tablet Take 1 tablet (20 mg total) by mouth daily. 04/18/22   Angiulli, Mcarthur Rossetti, PA-C  timolol (BETIMOL)  0.5 % ophthalmic solution Place 1 drop into the left eye 2 (two) times daily.    [provider]  traZODone (DESYREL) 50 MG tablet Take 50 mg by mouth at bedtime.    [provider]      Allergies    Penicillins, Lisinopril, and Penicillin g    Review of Systems   Review of Systems  Respiratory:  Positive for cough and shortness of breath.   Cardiovascular:  Positive for chest pain.  Gastrointestinal:  Positive for abdominal pain.    Physical Exam Updated Vital Signs BP 114/74 (BP Location: Left Arm)   Pulse 74   Temp 97.6 F (36.4 C) (Oral)   Resp 19   Ht 1.6 m (5\' 3" )   Wt 74.4 kg   SpO2 100%   BMI 29.05 kg/m  Physical  Exam CONSTITUTIONAL: Elderly, ill-appearing HEAD: Normocephalic/atraumatic EYES: EOMI ENMT: Mucous membranes moist CV: S1/S2 noted, no murmurs/rubs/gallops noted LUNGS: Wheezing bilaterally, coughing frequently during exam ABDOMEN: soft, nontender, no rebound or guarding, bowel sounds noted throughout abdomen NEURO: Pt is awake/alert, mostly nonverbal, but follows commands EXTREMITIES: pulses normal/equal no deformities, no LE edema SKIN: warm, color normal  ED Results / Procedures / Treatments   Labs (all labs ordered are listed, but only abnormal results are displayed) Labs Reviewed  BASIC METABOLIC PANEL - Abnormal; Notable for the following components:      Result Value   CO2 18 (*)    Glucose, Bld 296 (*)    BUN 30 (*)    Creatinine, Ser 1.93 (*)    Calcium 8.8 (*)    GFR, Estimated 32 (*)    All other components within normal limits  CBC - Abnormal; Notable for the following components:   RBC 3.94 (*)    Hemoglobin 9.4 (*)    HCT 30.0 (*)    MCV 76.1 (*)    MCH 23.9 (*)    All other components within normal limits  BRAIN NATRIURETIC PEPTIDE - Abnormal; Notable for the following components:   B Natriuretic Peptide 174.3 (*)    All other components within normal limits  CBG MONITORING, ED - Abnormal; Notable for the following components:   Glucose-Capillary 265 (*)    All other components within normal limits  TROPONIN I (HIGH SENSITIVITY) - Abnormal; Notable for the following components:   Troponin I (High Sensitivity) 27 (*)    All other components within normal limits  TROPONIN I (HIGH SENSITIVITY) - Abnormal; Notable for the following components:   Troponin I (High Sensitivity) 24 (*)    All other components within normal limits  LIPASE, BLOOD  URINALYSIS, ROUTINE W REFLEX MICROSCOPIC    EKG EKG Interpretation Date/Time:  Saturday November 11 2022 00:26:11 EDT Ventricular Rate:  71 PR Interval:  155 QRS Duration:  171 QT Interval:  464 QTC  Calculation: 505 R Axis:   -71  Text Interpretation: Sinus rhythm RBBB and LAFB Left ventricular hypertrophy Confirmed by Zadie Rhine ([id number]) on 11/11/2022 12:38:47 AM  Radiology DG Chest 1 View  Result Date: 11/10/2022 CLINICAL DATA:  Substernal chest pain. EXAM: CHEST  1 VIEW COMPARISON:  September 02, 2022 FINDINGS: The heart size and mediastinal contours are within normal limits. Mild diffusely increased interstitial lung markings are seen. This is increased in severity when compared to the prior study. Mild perihilar prominence of the pulmonary vasculature is also noted. No pleural effusion or pneumothorax is identified. Multilevel degenerative changes seen throughout the thoracic spine. IMPRESSION: Mild pulmonary vascular  congestion with mild interstitial edema. Electronically Signed   By: Aram Candela M.D.   On: 11/10/2022 21:12    Procedures Procedures    Medications Ordered in ED Medications  ipratropium-albuterol (DUONEB) 0.5-2.5 (3) MG/3ML nebulizer solution 3 mL (3 mLs Nebulization Given 11/11/22 0059)    ED Course/ Medical Decision Making/ A&P Clinical Course as of 11/11/22 0221  Sat Nov 11, 2022  0136 Hemoglobin(!): 9.4 Anemia noted [DW]  0136 Creatinine(!): 1.93 Acute kidney injury [DW]  0137 Glucose(!): 296 Hyperglycemia without anion gap [DW]  0219 Presence of edema on chest x-ray, and mildly elevated BNP. It is possible his cough and shortness of breath for the past several months his underlying CHF. Patient will be admitted for further workup and evaluation.  He is more alert at this time, but has been pulling off wires likely due to sundowning. [DW]  0219 Discussed with Dr. Antionette Char for admission [DW]    Clinical Course User Index [DW] Zadie Rhine, MD                                 Medical Decision Making Amount and/or Complexity of Data Reviewed Labs: ordered. Decision-making details documented in ED Course.  Risk Prescription drug  management. Decision regarding hospitalization.   This patient presents to the ED for concern of chest pain and SOB, this involves an extensive number of treatment options, and is a complaint that carries with it a high risk of complications and morbidity.  The differential diagnosis includes but is not limited to Acute coronary syndrome, pneumonia, acute pulmonary edema, pneumothorax, acute anemia, pulmonary embolism    Comorbidities that complicate the patient evaluation: Patient's presentation is complicated by their history of dementia and diabetes  Social Determinants of Health: Patient's  dementia   increases the complexity of managing their presentation  Additional history obtained: Additional history obtained from spouse Records reviewed previous admission documents  Lab Tests: I Ordered, and personally interpreted labs.  The pertinent results include: Hyperglycemia, acute kidney injury  Imaging Studies ordered: I ordered imaging studies including X-ray chest   I independently visualized and interpreted imaging which showed vascular congestion I agree with the radiologist interpretation  Cardiac Monitoring: The patient was maintained on a cardiac monitor.  I personally viewed and interpreted the cardiac monitor which showed an underlying rhythm of:  sinus rhythm  Medicines ordered and prescription drug management: I ordered medication including nebulized treatments for wheezing Reevaluation of the patient after these medicines showed that the patient    improved  Critical Interventions:   nebulized treatments, admission  Consultations Obtained: I requested consultation with the admitting physician [facility] , and discussed  findings as well as pertinent plan - they recommend: Admit  Reevaluation: After the interventions noted above, I reevaluated the patient and found that they have :improved  Complexity of problems addressed: Patient's presentation is most consistent  with  acute presentation with potential threat to life or bodily function  Disposition: After consideration of the diagnostic results and the patient's response to treatment,  I feel that the patent would benefit from admission   .           Final Clinical Impression(s) / ED Diagnoses Final diagnoses:  AKI (acute kidney injury) (HCC)  Congestive heart failure, unspecified HF chronicity, unspecified heart failure type (HCC)    Rx / DC Orders ED Discharge Orders     None  Zadie Rhine, MD 11/11/22 [phone]

## 2022-11-11 NOTE — Progress Notes (Addendum)
Progress Note   Patient: Grant Jensen. ZOX:[id number] DOB: [birth year] DOA: 11/10/2022     0 DOS: the patient was seen and examined on 11/11/2022   Brief hospital course: Mr. Andre was admitted to the hospital with the working diagnosis of heart failure decompensation.   89 yo male with the past medical history of hypertension, T2DM and Alzheimer's dementia who presented with dyspnea. Patient with several weeks of dyspnea and cough, treated as outpatient with systemic steroids with not much improvement in his symptoms. The night prior to admission his symptoms worsened, with increased work of breathing, prompting his wife to bring him to the ED. On his initial physical examination his blood pressure was 114/74, HR 74, RR 19, 02 saturation 97.6 Lungs with positive wheezing, no rhonchi, heart with S1 and S2 present and regular, abdomen with no distention, positive lower extremity edema.   Na 136, K 4,2 Cl 103, bicarbonate 18, glucose 296, bun 30 cr 1,93  BNP 174  High sensitive troponin 27 and 24  Wbc 6,9 hgb 9,4 plt 193   Chest radiograph with cardiomegaly with bilateral interstitial infiltrates, small bilateral pleural effusions.   EKG 83 bpm, left axis deviation, left anterior fascicular block, right bundle branch block, sinus rhythm with no significant ST segment or T wave changes.   Assessment and Plan: * Acute on chronic diastolic CHF (congestive heart failure) (HCC) Echocardiogram from 04/2022 with preserved LV systolic function EF 60 to 65%, mild LVH, RV systolic function preserved, no significant valvular disease.  Patient continue volume overloaded.  Blood pressure systolic 120's   Plan to continue diuresis with furosemide, increase to 60 mg IV bid. Continue isosorbide and will add low dose hydralazine for after load reduction.  Limited pharmacologic therapy due to low GFR.  Check new echocardiogram.    Essential hypertension Continue blood pressure monitoring Continue  with isosorbide and will add low dose hydralazine.   Acute renal failure superimposed on stage 3b chronic kidney disease (HCC) Renal function with improved cr from admission. Plan to continue diuresis, follow up renal function in am.  Avoid hypotension or nephrotoxic medications.   Type 2 diabetes mellitus with hyperlipidemia (HCC) Continue insulin sliding scale for glucose cover and monitoring.  Continue with statin therapy.   History of stroke Continue blood pressure monitoring.  Continue with rosuvastatin and clopidogrel.   Microcytic anemia Hgb stable.   [facility]) Patient has bilateral mittens in place. His wife is at the beside/  Continue neuro checks per unit protocol, he has a high risk for delirium         Subjective: Patient with improvement in his symptoms but not back to baseline, his wife is at the bedside, not able to provide history due to cognitive impairment.   Physical Exam: Vitals:   11/11/22 1045 11/11/22 1239 11/11/22 1412 11/11/22 1541  BP: (!) 123/58 (!) 140/125 117/81 (!) 142/70  Pulse: 69 70  68  Resp: 20 18  14   Temp:  98.2 F (36.8 C)    TempSrc:  Oral    SpO2: 94% 100%  100%  Weight:      Height:       Neurology awake and alert,. Follows commands and responds to simple questions, positive confusion  ENT with mild pallor Cardiovascular with S1 and S2 present and regular with no gallops or murmurs Positive moderate JVD Respiratory with rales at bases with no wheezing or rhonchi Abdomen with no distention  Data Reviewed:    Family Communication:  I spoke with patient's wife at the bedside, we talked in detail about patient's condition, plan of care and prognosis and all questions were addressed.   Disposition: Status is: Inpatient Remains inpatient appropriate because: diuresis   Planned Discharge Destination: Home     Author: Coralie Keens, MD 11/11/2022 4:16 PM  For on call review [email or web address].

## 2022-11-11 NOTE — ED Notes (Signed)
Mittens placed due to pt pulling lines and monitor, and clothes off

## 2022-11-11 NOTE — ED Notes (Signed)
This RN attempted to place IV but patient unable to sit still.  Pt does have mittens on at this time.  Wife notified and aware.

## 2022-11-11 NOTE — ED Notes (Signed)
Spoke with staffing office, there are no sitters available at this time.

## 2022-11-12 ENCOUNTER — Other Ambulatory Visit (HOSPITAL_COMMUNITY): Payer: Medicare Other

## 2022-11-12 DIAGNOSIS — I10 Essential (primary) hypertension: Secondary | ICD-10-CM | POA: Diagnosis not present

## 2022-11-12 DIAGNOSIS — N179 Acute kidney failure, unspecified: Secondary | ICD-10-CM | POA: Diagnosis not present

## 2022-11-12 DIAGNOSIS — I5033 Acute on chronic diastolic (congestive) heart failure: Secondary | ICD-10-CM | POA: Diagnosis not present

## 2022-11-12 DIAGNOSIS — Z8673 Personal history of transient ischemic attack (TIA), and cerebral infarction without residual deficits: Secondary | ICD-10-CM | POA: Diagnosis not present

## 2022-11-12 LAB — BASIC METABOLIC PANEL
Anion gap: 15 (ref 5–15)
BUN: 26 mg/dL — ABNORMAL HIGH (ref 8–23)
CO2: 25 mmol/L (ref 22–32)
Calcium: 8.9 mg/dL (ref 8.9–10.3)
Chloride: 100 mmol/L (ref 98–111)
Creatinine, Ser: 1.89 mg/dL — ABNORMAL HIGH (ref 0.61–1.24)
GFR, Estimated: 33 mL/min — ABNORMAL LOW (ref >60)
Glucose, Bld: 118 mg/dL — ABNORMAL HIGH (ref 70–99)
Potassium: 3.4 mmol/L — ABNORMAL LOW (ref 3.5–5.1)
Sodium: 140 mmol/L (ref 135–145)

## 2022-11-12 LAB — GLUCOSE, CAPILLARY
Glucose-Capillary: 321 mg/dL — ABNORMAL HIGH (ref 70–99)
Glucose-Capillary: 149 mg/dL — ABNORMAL HIGH (ref 70–99)

## 2022-11-12 LAB — MAGNESIUM: Magnesium: 2 mg/dL (ref 1.7–2.4)

## 2022-11-12 MED ORDER — HYDROXYZINE HCL 25 MG PO TABS
25.0000 mg | ORAL_TABLET | Freq: Four times a day (QID) | ORAL | Status: DC | PRN
  Administered 2022-11-13: 25 mg via ORAL
  Filled 2022-11-12: qty 1

## 2022-11-12 MED ORDER — POTASSIUM CHLORIDE CRYS ER 20 MEQ PO TBCR
40.0000 meq | EXTENDED_RELEASE_TABLET | Freq: Once | ORAL | Status: AC
  Administered 2022-11-12: 40 meq via ORAL
  Filled 2022-11-12: qty 2

## 2022-11-12 MED ORDER — INSULIN ASPART 100 UNIT/ML IJ SOLN
0.0000 [IU] | Freq: Three times a day (TID) | INTRAMUSCULAR | Status: DC
  Administered 2022-11-12: 11 [IU] via SUBCUTANEOUS
  Administered 2022-11-13 (×2): 3 [IU] via SUBCUTANEOUS
  Administered 2022-11-13: 11 [IU] via SUBCUTANEOUS
  Administered 2022-11-14: 8 [IU] via SUBCUTANEOUS
  Administered 2022-11-14: 11 [IU] via SUBCUTANEOUS

## 2022-11-12 NOTE — Progress Notes (Signed)
CBG 312.  Data not transferring from glucometer to Epic.  Novolog 4 units given per SSI order.  Will continue to monitor.  Grant Jensen

## 2022-11-12 NOTE — Evaluation (Signed)
Physical Therapy Evaluation Patient Details Name: Grant Jensen. MRN: [id number] DOB: April 04, 1928 Today's Date: 11/12/2022  History of Present Illness  89 yo male admitted 9/6 who presented with dyspnea. Patient with several weeks of dyspnea and cough, treated as outpatient with systemic steroids with not much improvement in his symptoms. PMH: hypertension, T2DM and Alzheimer's dementia, blindness  Clinical Impression  Pt admitted with above diagnosis. Pt was able to ambulate with min assist with RW with mod cues needing a lot of support and assist with moving RW. Wife states pt always needs assist for walking and she is interested in HHPT as well as an aide 2 x week if she can get that. Will follow acutely and progress pt as able.  Pt currently with functional limitations due to the deficits listed below (see PT Problem List). Pt will benefit from acute skilled PT to increase their independence and safety with mobility to allow discharge.           If plan is discharge home, recommend the following: A little help with walking and/or transfers;A little help with bathing/dressing/bathroom;Assistance with cooking/housework;Assist for transportation;Help with stairs or ramp for entrance   Can travel by private vehicle        Equipment Recommendations None recommended by PT  Recommendations for Other Services       Functional Status Assessment Patient has had a recent decline in their functional status and demonstrates the ability to make significant improvements in function in a reasonable and predictable amount of time.     Precautions / Restrictions Precautions Precautions: Fall Precaution Comments: Pt is blind Restrictions Weight Bearing Restrictions: No      Mobility  Bed Mobility Overal bed mobility: Needs Assistance Bed Mobility: Supine to Sit     Supine to sit: Mod assist     General bed mobility comments: Needed assist for LES and trunk elevation     Transfers Overall transfer level: Needs assistance Equipment used: Rolling walker (2 wheels) Transfers: Sit to/from Stand Sit to Stand: Mod assist, From elevated surface           General transfer comment: Pt needed mod assist to power up and min assist to stay up once upright as he tends to lean posteriorly and flexes at trunk and knees needing cues to stand tall constantly.    Ambulation/Gait Ambulation/Gait assistance: Min assist Gait Distance (Feet): 35 Feet Assistive device: Rolling walker (2 wheels) Gait Pattern/deviations: Step-through pattern, Decreased stride length, Trunk flexed, Wide base of support, Drifts right/left, Leaning posteriorly, Shuffle   Gait velocity interpretation: <1.31 ft/sec, indicative of household ambulator   General Gait Details: Pt requires assist to move RW as well as constant cues to stay upright as pt tends to flex at trunk, head, hips and knees. Pts wife present and states that pt needs at least min asssist at baseline and needs more assist on bad days. Pt is most likely close to baseline.  Stairs            Wheelchair Mobility     Tilt Bed    Modified Rankin (Stroke Patients Only)       Balance Overall balance assessment: Needs assistance Sitting-balance support: No upper extremity supported, Feet supported Sitting balance-Leahy Scale: Fair     Standing balance support: Bilateral upper extremity supported, During functional activity Standing balance-Leahy Scale: Poor Standing balance comment: relies on UE and external support  Pertinent Vitals/Pain Pain Assessment Pain Assessment: No/denies pain    Home Living Family/patient expects to be discharged to:: Private residence Living Arrangements: Spouse/significant other Available Help at Discharge: Family;Available 24 hours/day Type of Home: House Home Access: Stairs to enter Entrance Stairs-Rails: Can reach  both;Right;Left Entrance Stairs-Number of Steps: 3   Home Layout: One level Home Equipment: Agricultural consultant (2 wheels);Transport chair;BSC/3in1;Shower seat;Grab bars - tub/shower;Hand held shower head Additional Comments: goes to [employer] day care 3-5 times per week ; paid by DSS; wife transports; wife requests to have 2 day week help at home as pt cant always go to memory day care    Prior Function Prior Level of Function : Needs assist;History of Falls (last six months) (fell into tub last week per pt as she left him to gte something)             Mobility Comments: Pt requires bil HHA or RW for mobility and guidance for all standing mobility due to his blindness. Able to ambulate household distances and navigate stairs with guidance. Hx of falls. ADLs Comments: Wife assists with all ADLs due to blindness and dementia. Pt able to assist in dressing.     Extremity/Trunk Assessment   Upper Extremity Assessment Upper Extremity Assessment: Defer to OT evaluation    Lower Extremity Assessment Lower Extremity Assessment: Generalized weakness    Cervical / Trunk Assessment Cervical / Trunk Assessment: Kyphotic  Communication   Communication Communication: Hearing impairment  Cognition Arousal: Alert Behavior During Therapy: Flat affect Overall Cognitive Status: Within Functional Limits for tasks assessed                                          General Comments General comments (skin integrity, edema, etc.): 102/60, 82 bpm    Exercises     Assessment/Plan    PT Assessment Patient needs continued PT services  PT Problem List Decreased activity tolerance;Decreased balance;Decreased mobility;Decreased knowledge of use of DME;Decreased safety awareness;Decreased knowledge of precautions       PT Treatment Interventions DME instruction;Gait training;Functional mobility training;Therapeutic activities;Therapeutic exercise;Balance  training;Patient/family education    PT Goals (Current goals can be found in the Care Plan section)  Acute Rehab PT Goals Patient Stated Goal: to go home PT Goal Formulation: With patient Time For Goal Achievement: 11/26/22 Potential to Achieve Goals: Good    Frequency Min 1X/week     Co-evaluation               AM-PAC PT "6 Clicks" Mobility  Outcome Measure Help needed turning from your back to your side while in a flat bed without using bedrails?: A Little Help needed moving from lying on your back to sitting on the side of a flat bed without using bedrails?: A Lot Help needed moving to and from a bed to a chair (including a wheelchair)?: A Lot Help needed standing up from a chair using your arms (e.g., wheelchair or bedside chair)?: A Lot Help needed to walk in hospital room?: A Lot Help needed climbing 3-5 steps with a railing? : Total 6 Click Score: 12    End of Session Equipment Utilized During Treatment: Gait belt Activity Tolerance: Patient limited by fatigue Patient left: in chair;with call bell/phone within reach;with chair alarm set;with family/visitor present Nurse Communication: Mobility status PT Visit Diagnosis: Unsteadiness on feet (R26.81)    Time: 7829-5621 PT Time Calculation (  min) (ACUTE ONLY): 23 min   Charges:   PT Evaluation $PT Eval Moderate Complexity: 1 Mod PT Treatments $Gait Training: 8-22 mins PT General Charges $$ ACUTE PT VISIT: 1 Visit         Ludger Bones M,PT Acute Rehab Services [phone]   Bevelyn Buckles 11/12/2022, 3:43 PM

## 2022-11-12 NOTE — Progress Notes (Signed)
Progress Note   Patient: Grant Jensen. ZOX:[id number] DOB: [birth year] DOA: 11/10/2022     1 DOS: the patient was seen and examined on 11/12/2022   Brief hospital course: Mr. Goold was admitted to the hospital with the working diagnosis of heart failure decompensation.   89 yo male with the past medical history of hypertension, T2DM and Alzheimer's dementia who presented with dyspnea. Patient with several weeks of dyspnea and cough, treated as outpatient with systemic steroids with not much improvement in his symptoms. The night prior to admission his symptoms worsened, with increased work of breathing, prompting his wife to bring him to the ED. On his initial physical examination his blood pressure was 114/74, HR 74, RR 19, 02 saturation 97.6 Lungs with positive wheezing, no rhonchi, heart with S1 and S2 present and regular, abdomen with no distention, positive lower extremity edema.   Na 136, K 4,2 Cl 103, bicarbonate 18, glucose 296, bun 30 cr 1,93  BNP 174  High sensitive troponin 27 and 24  Wbc 6,9 hgb 9,4 plt 193   Chest radiograph with cardiomegaly with bilateral interstitial infiltrates, small bilateral pleural effusions.   EKG 83 bpm, left axis deviation, left anterior fascicular block, right bundle branch block, sinus rhythm with no significant ST segment or T wave changes.   Patient placed on furosemide for diuresis. Has been agitated and required bilateral mittens.   Assessment and Plan: * Acute on chronic diastolic CHF (congestive heart failure) (HCC) Echocardiogram from 04/2022 with preserved LV systolic function EF 60 to 65%, mild LVH, RV systolic function preserved, no significant valvular disease.  Urine output 1,950 ml Systolic blood pressure 119 to 121 mmHg  Plan to continue diuresis with furosemide 60 mg IV bid. Isosorbide and hydralazine for after load reduction.  Limited pharmacologic therapy due to low GFR.  Follow up on echocardiogram.   Essential  hypertension Continue blood pressure monitoring Continue with isosorbide/ hydralazine.   Acute renal failure superimposed on stage 3b chronic kidney disease (HCC) Hypokalemia.   Renal function with serum cr at 1,89 with K at 3,4 and serum bicarbonate at 25. Na 140. Mg 2,0  Plan to continue diuresis with IV furosemide and follow up renal function in am.  Add 40 meq Kcl  Type 2 diabetes mellitus with hyperlipidemia (HCC) Continue insulin sliding scale for glucose cover and monitoring.  Uncontrolled T2DM with hyperglycemia.  Will increase dose of insulin sliding scale  Continue with statin therapy.   History of stroke Continue blood pressure monitoring.  Continue with rosuvastatin and clopidogrel.   Microcytic anemia Hgb stable.   Dementia (HCC) Acute metabolic encephalopathy.   Patient has bilateral mittens in place. His is blind but his wife has been at his bedside.   Continue neuro checks per unit protocol, he has a high risk for delirium  Add hydroxyzine as needed for agitation. Trazodone at night for sleep.         Subjective: Patient has been agitated, his wife at the bedside this morning. He reports improvement in his dyspnea and his wife mentions that patient's cough has improved.   Physical Exam: Vitals:   11/11/22 2106 11/12/22 0539 11/12/22 0813 11/12/22 1231  BP: 117/60 119/75    Pulse:  75    Resp:  20 20   Temp:  97.9 F (36.6 C) 98.4 F (36.9 C) 98.3 F (36.8 C)  TempSrc:  Oral Oral Oral  SpO2:      Weight:  79.7 kg  Height:       Neurology awake and alert. Disorientated, able to follow simple commands and answer simple questions.  ENT with mild pallor Cardiovascular with S1 and S2 present and regular, positive systolic murmur at the apex Mild JVD Trace lower extremity edema Respiratory with no rales or wheezing on anterior auscultation  Abdomen with no distention  Data Reviewed:    Family Communication: I spoke with patient's wife at  the bedside, we talked in detail about patient's condition, plan of care and prognosis and all questions were addressed.   Disposition: Status is: Inpatient Remains inpatient appropriate because: IV diuresis, possible dc home in 24 hrs   Planned Discharge Destination: Home     Author: Coralie Keens, MD 11/12/2022 2:30 PM  For on call review [email or web address].

## 2022-11-12 NOTE — Plan of Care (Signed)

## 2022-11-12 NOTE — Plan of Care (Signed)
Patient's wife has been at bedside all day. No complaints of pain. Patient has been stable and takes medications whole with no issues. Will continue to monitor patient.

## 2022-11-13 ENCOUNTER — Inpatient Hospital Stay (HOSPITAL_COMMUNITY): Payer: Medicare Other

## 2022-11-13 DIAGNOSIS — N179 Acute kidney failure, unspecified: Secondary | ICD-10-CM | POA: Diagnosis not present

## 2022-11-13 DIAGNOSIS — Z8673 Personal history of transient ischemic attack (TIA), and cerebral infarction without residual deficits: Secondary | ICD-10-CM | POA: Diagnosis not present

## 2022-11-13 DIAGNOSIS — R0609 Other forms of dyspnea: Secondary | ICD-10-CM | POA: Diagnosis not present

## 2022-11-13 DIAGNOSIS — I10 Essential (primary) hypertension: Secondary | ICD-10-CM | POA: Diagnosis not present

## 2022-11-13 DIAGNOSIS — I5033 Acute on chronic diastolic (congestive) heart failure: Secondary | ICD-10-CM | POA: Diagnosis not present

## 2022-11-13 LAB — ECHOCARDIOGRAM COMPLETE
AR max vel: 1.42 cm2
AV Area VTI: 1.47 cm2
AV Area mean vel: 1.48 cm2
AV Mean grad: 7 mmHg
AV Peak grad: 14 mmHg
Ao pk vel: 1.87 m/s
Area-P 1/2: 4.49 cm2
Height: 63 in
S' Lateral: 2.3 cm
Weight: 2624.36 [oz_av]

## 2022-11-13 LAB — BASIC METABOLIC PANEL
Anion gap: 14 (ref 5–15)
BUN: 30 mg/dL — ABNORMAL HIGH (ref 8–23)
CO2: 27 mmol/L (ref 22–32)
Calcium: 9 mg/dL (ref 8.9–10.3)
Chloride: 99 mmol/L (ref 98–111)
Creatinine, Ser: 2.16 mg/dL — ABNORMAL HIGH (ref 0.61–1.24)
GFR, Estimated: 28 mL/min — ABNORMAL LOW (ref >60)
Glucose, Bld: 163 mg/dL — ABNORMAL HIGH (ref 70–99)
Potassium: 3.7 mmol/L (ref 3.5–5.1)
Sodium: 140 mmol/L (ref 135–145)

## 2022-11-13 LAB — GLUCOSE, CAPILLARY
Glucose-Capillary: 240 mg/dL — ABNORMAL HIGH (ref 70–99)
Glucose-Capillary: 272 mg/dL — ABNORMAL HIGH (ref 70–99)
Glucose-Capillary: 158 mg/dL — ABNORMAL HIGH (ref 70–99)
Glucose-Capillary: 302 mg/dL — ABNORMAL HIGH (ref 70–99)
Glucose-Capillary: 188 mg/dL — ABNORMAL HIGH (ref 70–99)
Glucose-Capillary: 364 mg/dL — ABNORMAL HIGH (ref 70–99)

## 2022-11-13 LAB — MAGNESIUM: Magnesium: 1.9 mg/dL (ref 1.7–2.4)

## 2022-11-13 MED ORDER — DONEPEZIL HCL 10 MG PO TABS
10.0000 mg | ORAL_TABLET | Freq: Every day | ORAL | Status: DC
  Administered 2022-11-13: 10 mg via ORAL
  Filled 2022-11-13: qty 1

## 2022-11-13 MED ORDER — MAGNESIUM OXIDE -MG SUPPLEMENT 400 (240 MG) MG PO TABS
400.0000 mg | ORAL_TABLET | Freq: Three times a day (TID) | ORAL | Status: AC
  Administered 2022-11-13 – 2022-11-14 (×3): 400 mg via ORAL
  Filled 2022-11-13 (×3): qty 1

## 2022-11-13 MED ORDER — POTASSIUM CHLORIDE CRYS ER 20 MEQ PO TBCR
20.0000 meq | EXTENDED_RELEASE_TABLET | Freq: Once | ORAL | Status: AC
  Administered 2022-11-13: 20 meq via ORAL
  Filled 2022-11-13: qty 1

## 2022-11-13 MED ORDER — MAGNESIUM SULFATE 2 GM/50ML IV SOLN: 2.0000 g | Freq: Once | INTRAVENOUS | Status: DC

## 2022-11-13 MED ORDER — ARFORMOTEROL TARTRATE 15 MCG/2ML IN NEBU
15.0000 ug | INHALATION_SOLUTION | Freq: Two times a day (BID) | RESPIRATORY_TRACT | Status: DC
  Administered 2022-11-13 – 2022-11-14 (×2): 15 ug via RESPIRATORY_TRACT
  Filled 2022-11-13 (×2): qty 2

## 2022-11-13 MED ORDER — BUDESONIDE 0.25 MG/2ML IN SUSP
0.2500 mg | Freq: Two times a day (BID) | RESPIRATORY_TRACT | Status: DC
  Administered 2022-11-13 – 2022-11-14 (×2): 0.25 mg via RESPIRATORY_TRACT
  Filled 2022-11-13 (×2): qty 2

## 2022-11-13 NOTE — TOC Initial Note (Addendum)
Transition of Care (TOC) - Initial/Assessment Note    Patient Details  Name: Grant Jensen. MRN: [id number] Date of Birth: 05/12/1928  Transition of Care [facility]) CM/SW Contact:    Gala Lewandowsky, RN Phone Number: 11/13/2022, 4:27 PM  Clinical Narrative: Risk for readmission assessment completed. Patient presented for shortness of breath. PTA patient was from home with spouse Grant Jensen. Spouse states patient has DME rolling walker wheelchair, and tub bench. Spouse is in need of PT/OT/Aide services in the home. Adding RN for CHF Management as well. Spouse did not have a preference for home health agency. Referral submitted to [facility] for home health services- awaiting acceptance. Case Manager will continue to follow for additional transition of care needs.   4098 11-13-22 [facility] can initially accept for PT/OT/Aide and add the RN as the visits progress. No further needs at this time.   Expected Discharge Plan: Home w Home Health Services Barriers to Discharge: Continued Medical Work up   Patient Goals and CMS Choice Patient states their goals for this hospitalization and ongoing recovery are:: spouse wants patient to return home   Choice offered to / list presented to : Spouse      Expected Discharge Plan and Services In-house Referral: NA Discharge Planning Services: CM Consult Post Acute Care Choice: Home Health Living arrangements for the past 2 months: Single Family Home                   DME Agency: NA       HH Arranged: PT, OT, Nurse's Aide HH Agency: [facility] ([facility]) Date HH Agency Contacted: 11/13/22 Time HH Agency Contacted: 1626 Representative spoke with at [facility] Agency: Marylene Land  Prior Living Arrangements/Services Living arrangements for the past 2 months: Single Family Home Lives with:: Spouse Patient language and need for interpreter reviewed:: Yes Do you feel safe going back to the place where you live?: Yes      Need for Family  Participation in Patient Care: Yes (Comment) Care giver support system in place?: Yes (comment) Current home services: DME (rolling walker, wheelchair, tub bench) Criminal Activity/Legal Involvement Pertinent to Current Situation/Hospitalization: No - Comment as needed  Activities of Daily Living      Permission Sought/Granted Permission sought to share information with : Family Supports, Magazine features editor, Case Estate manager/land agent granted to share information with : Yes, Verbal Permission Granted     Permission granted to share info w AGENCY: [facility]        Emotional Assessment Appearance:: Appears stated age Attitude/Demeanor/Rapport: Unable to Assess Affect (typically observed): Unable to Assess   Alcohol / Substance Use: Not Applicable Psych Involvement: No (comment)  Admission diagnosis:  AKI (acute kidney injury) (HCC) [N17.9] Acute on chronic diastolic CHF (congestive heart failure) (HCC) [I50.33] Congestive heart failure, unspecified HF chronicity, unspecified heart failure type (HCC) [I50.9] Patient Active Problem List   Diagnosis Date Noted   Acute on chronic diastolic CHF (congestive heart failure) (HCC) 11/11/2022   Acute renal failure superimposed on stage 3b chronic kidney disease (HCC) 11/11/2022   Microcytic anemia 11/11/2022   Small vessel cerebrovascular accident (CVA) (HCC) 04/14/2022   History of stroke 04/09/2022   Rhabdomyolysis 05/01/2020   Dementia (HCC) 05/01/2020   Fall at home, initial encounter 05/01/2020   Type 2 diabetes mellitus with hyperlipidemia (HCC) 05/01/2020   Essential hypertension 05/01/2020   NICM (nonischemic cardiomyopathy) (HCC) 05/01/2020   Chest pain with high risk for cardiac etiology 06/20/2014   PCP:  Hillard Danker  Demetrius Charity, MD Pharmacy:   [facility] [address] [place]), [place] - [address] [place] [address] Phone: [phone] Fax: [phone]     Social  Determinants of Health (SDOH) Social History: SDOH Screenings   Food Insecurity: No Food Insecurity (04/09/2022)  Housing: Low Risk  (04/09/2022)  Transportation Needs: No Transportation Needs (04/10/2022)  Utilities: At Risk (04/10/2022)  Depression (PHQ2-9): High Risk (05/02/2022)  Tobacco Use: Unknown (11/11/2022)   SDOH Interventions:     Readmission Risk Interventions    11/13/2022    4:22 PM  Readmission Risk Prevention Plan  Transportation Screening Complete  Home Care Screening Complete  Medication Review (RN CM) Complete

## 2022-11-13 NOTE — Progress Notes (Signed)
Heart Failure Navigator Progress Note  Assessed for Heart & Vascular TOC clinic readiness.  Patient does not meet criteria due to history of alzheimer's dementia.   Sharen Hones, PharmD, BCPS Heart Failure Stewardship Pharmacist Phone [phone]

## 2022-11-13 NOTE — Progress Notes (Signed)
Progress Note   Patient: Grant Jensen. WUJ:[id number] DOB: May 31, 1928 DOA: 11/10/2022     2 DOS: the patient was seen and examined on 11/13/2022   Brief hospital course: Grant Jensen was admitted to the hospital with the working diagnosis of heart failure decompensation.   89 yo male with the past medical history of hypertension, T2DM and Alzheimer's dementia who presented with dyspnea. Patient with several weeks of dyspnea and cough, treated as outpatient with systemic steroids with not much improvement in his symptoms. The night prior to admission his symptoms worsened, with increased work of breathing, prompting his wife to bring him to the ED. On his initial physical examination his blood pressure was 114/74, HR 74, RR 19, 02 saturation 97.6 Lungs with positive wheezing, no rhonchi, heart with S1 and S2 present and regular, abdomen with no distention, positive lower extremity edema.   Na 136, K 4,2 Cl 103, bicarbonate 18, glucose 296, bun 30 cr 1,93  BNP 174  High sensitive troponin 27 and 24  Wbc 6,9 hgb 9,4 plt 193   Chest radiograph with cardiomegaly with bilateral interstitial infiltrates, small bilateral pleural effusions.   EKG 83 bpm, left axis deviation, left anterior fascicular block, right bundle branch block, sinus rhythm with no significant ST segment or T wave changes.   Patient placed on furosemide for diuresis. Has been agitated and required bilateral mittens.   09/09 echocardiogram with preserved LV systolic function, improved volume status. Renal function not at baseline.   Assessment and Plan: * Acute on chronic diastolic CHF (congestive heart failure) (HCC) Echocardiogram with preserved LV systolic function with EF 60 to 65%, moderate LVH, RV systolic function preserved,   Patient has lost 3 Kg with improvement in his symptoms.  Systolic blood pressure 110 to 115 mmHg  Isosorbide and hydralazine for after load reduction.  Limited pharmacologic therapy due to low  GFR.  Hold on furosemide today.  Essential hypertension Continue blood pressure monitoring Continue with isosorbide/ hydralazine.   Acute renal failure superimposed on stage 3b chronic kidney disease (HCC) Hypokalemia.   Improved volume status, renal function today with serum cr at 2,16 with K at 3,7 and serum bicarbonate at 27, Na 140   Hold on furosemide today and follow up renal function in am.  Add 20 Kcl and 2 g Mag today.   Type 2 diabetes mellitus with hyperlipidemia (HCC) Continue insulin sliding scale for glucose cover and monitoring.  Uncontrolled T2DM with hyperglycemia.  Continue with insulin sliding scale   Fasting glucose today is 163 mg/dl.   Continue with statin therapy.   History of stroke Continue blood pressure monitoring.  Continue with rosuvastatin and clopidogrel.   Microcytic anemia Hgb stable.   Dementia (HCC) Acute metabolic encephalopathy.   Patient has bilateral mittens in place. His is blind but his wife has been at his bedside.   Continue neuro checks per unit protocol, he has a high risk for delirium  Continue with hydroxyzine as needed for agitation. Trazodone at night for sleep.         Subjective: Patient with no chest pain, dyspnea and cough have improved, reported per his wife at the bedside.   Physical Exam: Vitals:   11/12/22 2129 11/13/22 0536 11/13/22 0627 11/13/22 0737  BP: 110/87 98/68 110/71   Pulse: 83 93    Resp:  18  18  Temp: 98.4 F (36.9 C) (!) 97.3 F (36.3 C)  98.4 F (36.9 C)  TempSrc: Axillary Oral  Oral  SpO2: 99% 100%    Weight:  74.4 kg    Height:       Neurology somnolent but easy to arouse ENT with mild pallor Cardiovascular with S1 and S2 present and regular with no gallops, rubs or murmurs Respiratory with no rales or wheezing, no rhonchi Abdomen with no distention  No lower extremity edema  Data Reviewed:    Family Communication: I spoke with patient's wife at the bedside, we talked  in detail about patient's condition, plan of care and prognosis and all questions were addressed.   Disposition: Status is: Inpatient Remains inpatient appropriate because: follow up renal function post diuresis   Planned Discharge Destination: Home     Author: Coralie Keens, MD 11/13/2022 11:03 AM  For on call review [email or web address].

## 2022-11-13 NOTE — Progress Notes (Signed)
RN attempted x2 to place PIV without success. Pt combative during both insertion attempts. Per family, pt to possibly go home today. Primary RN to ask doctor about discharge/Iv lasix order. Encouraged to re consult if necessary

## 2022-11-13 NOTE — Progress Notes (Signed)
Echocardiogram 2D Echocardiogram has been performed.  Grant Jensen 11/13/2022, 8:43 AM

## 2022-11-13 NOTE — Plan of Care (Deleted)
Patient has been transition to BIPAP at the end of shift do to change in status. Patient has been complaining of occasional shortness of breath, diaphoretic, lower HR also occurs at this time. MD aware of patient change and occurrences each time. Cardiology consulted for patient

## 2022-11-14 ENCOUNTER — Other Ambulatory Visit (HOSPITAL_COMMUNITY): Payer: Self-pay

## 2022-11-14 DIAGNOSIS — Z8673 Personal history of transient ischemic attack (TIA), and cerebral infarction without residual deficits: Secondary | ICD-10-CM | POA: Diagnosis not present

## 2022-11-14 DIAGNOSIS — I10 Essential (primary) hypertension: Secondary | ICD-10-CM | POA: Diagnosis not present

## 2022-11-14 DIAGNOSIS — N179 Acute kidney failure, unspecified: Secondary | ICD-10-CM | POA: Diagnosis not present

## 2022-11-14 DIAGNOSIS — I5033 Acute on chronic diastolic (congestive) heart failure: Secondary | ICD-10-CM | POA: Diagnosis not present

## 2022-11-14 LAB — RENAL FUNCTION PANEL
Albumin: 3.2 g/dL — ABNORMAL LOW (ref 3.5–5.0)
Anion gap: 12 (ref 5–15)
BUN: 33 mg/dL — ABNORMAL HIGH (ref 8–23)
CO2: 23 mmol/L (ref 22–32)
Calcium: 8.8 mg/dL — ABNORMAL LOW (ref 8.9–10.3)
Chloride: 101 mmol/L (ref 98–111)
Creatinine, Ser: 1.85 mg/dL — ABNORMAL HIGH (ref 0.61–1.24)
GFR, Estimated: 34 mL/min — ABNORMAL LOW (ref >60)
Glucose, Bld: 281 mg/dL — ABNORMAL HIGH (ref 70–99)
Phosphorus: 3.3 mg/dL (ref 2.5–4.6)
Potassium: 4.1 mmol/L (ref 3.5–5.1)
Sodium: 136 mmol/L (ref 135–145)

## 2022-11-14 LAB — GLUCOSE, CAPILLARY
Glucose-Capillary: 261 mg/dL — ABNORMAL HIGH (ref 70–99)
Glucose-Capillary: 315 mg/dL — ABNORMAL HIGH (ref 70–99)

## 2022-11-14 MED ORDER — FUROSEMIDE 40 MG PO TABS
40.0000 mg | ORAL_TABLET | Freq: Every day | ORAL | 0 refills | Status: AC
  Filled 2022-11-14: qty 30, 30d supply, fill #0

## 2022-11-14 MED ORDER — FUROSEMIDE 40 MG PO TABS
40.0000 mg | ORAL_TABLET | Freq: Every day | ORAL | Status: DC
  Administered 2022-11-14: 40 mg via ORAL
  Filled 2022-11-14: qty 1

## 2022-11-14 MED ORDER — HYDRALAZINE HCL 10 MG PO TABS
10.0000 mg | ORAL_TABLET | Freq: Three times a day (TID) | ORAL | 0 refills | Status: DC
  Filled 2022-11-14: qty 90, 30d supply, fill #0

## 2022-11-14 MED ORDER — HYDRALAZINE HCL 10 MG PO TABS
10.0000 mg | ORAL_TABLET | Freq: Two times a day (BID) | ORAL | 0 refills | Status: AC
  Filled 2022-11-14: qty 60, 30d supply, fill #0

## 2022-11-14 NOTE — Evaluation (Signed)
Clinical/Bedside Swallow Evaluation Patient Details  Name: Grant Jensen. MRN: [id number] Date of Birth: 08-03-1928  Today's Date: 11/14/2022 Time: SLP Start Time (ACUTE ONLY): 1345 SLP Stop Time (ACUTE ONLY): 1400 SLP Time Calculation (min) (ACUTE ONLY): 15 min  Past Medical History:  Past Medical History:  Diagnosis Date   Blindness    Dementia (HCC)    Diabetes mellitus without complication (HCC)    Glaucoma    Hypertension    Past Surgical History:  Past Surgical History:  Procedure Laterality Date   LEFT HEART CATHETERIZATION WITH CORONARY ANGIOGRAM N/A 06/22/2014   Procedure: LEFT HEART CATHETERIZATION WITH CORONARY ANGIOGRAM;  Surgeon: Yates Decamp, MD;  Location: [facility] CATH LAB;  Service: Cardiovascular;  Laterality: N/A;   HPI:  Patient is a 89 y.o. male with PMH: dementia, blindness, HTN, DM-2. He presented to the hospital on 11/11/22 from home (wife is his caregiver) with c/o SOB. Wife reported that patient developed cough and SOB in June and it has been progressively getting worse. In ED, patient afebrile, saturating well on RA, normal HR and stable BP. CXR showed Mild pulmonary vascular congestion with mild interstitial edema. Patient's spouse informed RN of her concern of patient coughing with PO's, prompting SLP swallow evaluation.    Assessment / Plan / Recommendation  Clinical Impression  Pt seen by SLP for bedside swallow evaluation per wife's concerns with coughing after PO intake. Pt was seated upright in chair with his wife in the room when SLP entered. He was pleasant and cooperative throughout the evaluation. Pt demonstrated no s/sx of dysphagia. Thin liquid (water), puree (applesauce), and solid (graham cracker) were administered. The pt's wife shared that she prepares him soft foods at home that he has a tendency to eat too quickly. Mastication with solid (graham cracker) was prolonged given he is edentulous and does not use dentures to eat. Pt did not complain of any  pain or discomfort associated with swallowing. Delayed cough was observed, but can likely be attributed to presence of chronic cough (present since June per pt wife). SLP recommendation of Dys 3 (mechanical soft) and thin liquids with full supervision to ensure use of compensatory strategies and to assist with feeding PRN. Pt's wife was advised to continue preparing him soft meals and to slow pt's rate of eating. SLP Visit Diagnosis: Dysphagia, unspecified (R13.10)    Aspiration Risk  No limitations    Diet Recommendation Dysphagia 3 (Mech soft);Thin liquid    Liquid Administration via: Cup;Straw Medication Administration: Whole meds with liquid Supervision: Full supervision/cueing for compensatory strategies Compensations: Slow rate;Small sips/bites Postural Changes: Seated upright at 90 degrees    Other  Recommendations Oral Care Recommendations: Oral care BID;Staff/trained caregiver to provide oral care    Recommendations for follow up therapy are one component of a multi-disciplinary discharge planning process, led by the attending physician.  Recommendations may be updated based on patient status, additional functional criteria and insurance authorization.  Follow up Recommendations No SLP follow up      Assistance Recommended at Discharge    Functional Status Assessment Patient has not had a recent decline in their functional status  Frequency and Duration            Prognosis        Swallow Study   General Date of Onset: 11/14/22 HPI: Patient is a 89 y.o. male with PMH: dementia, blindness, HTN, DM-2. He presented to the hospital on 11/11/22 from home (wife is his caregiver) with c/o SOB. Wife  reported that patient developed cough and SOB in June and it has been progressively getting worse. In ED, patient afebrile, saturating well on RA, normal HR and stable BP. CXR showed Mild pulmonary vascular congestion with mild interstitial edema. Patient's spouse informed RN of her  concern of patient coughing with PO's, prompting SLP swallow evaluation. Type of Study: Bedside Swallow Evaluation Previous Swallow Assessment: remote, Bedside swallow eval 2022 Diet Prior to this Study: Thin liquids (Level 0);Dysphagia 3 (mechanical soft) Temperature Spikes Noted: No Respiratory Status: Room air History of Recent Intubation: No Behavior/Cognition: Alert;Cooperative;Pleasant mood;Requires cueing Oral Cavity Assessment: Within Functional Limits Oral Care Completed by SLP: No Oral Cavity - Dentition: Edentulous Vision: Impaired for self-feeding Self-Feeding Abilities: Needs assist Patient Positioning: Upright in chair Baseline Vocal Quality: Normal Volitional Swallow: Able to elicit    Oral/Motor/Sensory Function Overall Oral Motor/Sensory Function: Within functional limits   Ice Chips Ice chips: Not tested   Thin Liquid Thin Liquid: Within functional limits Presentation: Cup;Straw    Nectar Thick     Honey Thick     Puree Puree: Within functional limits Presentation: Spoon   Solid     Solid: Within functional limits      Marline Backbone, B.S., Speech Therapy Student

## 2022-11-14 NOTE — Discharge Summary (Signed)
Physician Discharge Summary   Patient: Grant Jensen. MRN: [id number] DOB: 1929-03-06  Admit date:     11/10/2022  Discharge date: 11/14/22  Discharge Physician: York Ram Samon Dishner   PCP: Thana Ates, MD   Recommendations at discharge:    Patient will continue diuresis with furosemide 40 mg daily.  Follow up renal function and electrolytes in 7 days as outpatient. Added hydralazine for afterload reduction. Heart failure guideline medical therapy limited due to acute reduction in GFR and risk of hypotension.  Follow up with Dr [name] in 7 to 10 days.   Discharge Diagnoses: Principal Problem:   Acute on chronic diastolic CHF (congestive heart failure) (HCC) Active Problems:   Essential hypertension   Acute renal failure superimposed on stage 3b chronic kidney disease (HCC)   Type 2 diabetes mellitus with hyperlipidemia (HCC)   History of stroke   Microcytic anemia   Dementia (HCC)  Resolved Problems:   * No resolved hospital problems. [name] Course: 89. Mr. Cruise was admitted to the hospital with the working diagnosis of heart failure decompensation.   89 yo male with the past medical history of hypertension, T2DM and Alzheimer's dementia who presented with dyspnea. Patient with several weeks of dyspnea and cough, treated as outpatient with systemic steroids with not much improvement in his symptoms. The night prior to admission his symptoms worsened, with increased work of breathing, prompting his wife to bring him to the ED. On his initial physical examination his blood pressure was 114/74, HR 74, RR 19, 02 saturation 97.6 Lungs with positive wheezing, no rhonchi, heart with S1 and S2 present and regular, abdomen with no distention, positive lower extremity edema.   Na 136, K 4,2 Cl 103, bicarbonate 18, glucose 296, bun 30 cr 1,93  BNP 174  High sensitive troponin 27 and 24  Wbc 6,9 hgb 9,4 plt 193   Chest radiograph with cardiomegaly with bilateral interstitial  infiltrates, small bilateral pleural effusions.   EKG 83 bpm, left axis deviation, left anterior fascicular block, right bundle branch block, sinus rhythm with no significant ST segment or T wave changes.   Patient placed on furosemide for diuresis. Has been agitated and required bilateral mittens.   09/09 echocardiogram with preserved LV systolic function, improved volume status. Renal function not at baseline.  09/10 renal function improving, patient continue to be euvolemic.  Plan to continue diuresis at home with oral furosemide and follow up as outpatient.   Assessment and Plan: * Acute on chronic diastolic CHF (congestive heart failure) (HCC) Echocardiogram with preserved LV systolic function with EF 60 to 65%, moderate LVH, RV systolic function preserved,   Patient was placed on IV furosemide for diuresis, negative fluid balance was achieved with significant improvement in his symptoms. During this hospitalization he had a 5 Kg, weight loss.   Isosorbide and hydralazine for after load reduction. Loop diuretic therapy with furosemide.   Limited pharmacologic therapy due to low GFR and risk of hypotension.   Essential hypertension Continue with isosorbide/ hydralazine.  Systolic blood pressure at the time of his discharge was 128 to 102 mmHg.   Acute renal failure superimposed on stage 3b chronic kidney disease (HCC) Hypokalemia.   Volume status has improved, renal function at the time of his discharge with serum cr at 1,85 with K at 4,1 and serum bicarbonate at 23.  Na 136   K was corrected with Kcl with good toleration.   Plan to continue diuresis with furosemide 40 mg daily.  Follow up renal function and electrolytes as outpatient.   Type 2 diabetes mellitus with hyperlipidemia (HCC) Uncontrolled T2DM with hyperglycemia.  Patient was placed on insulin sliding scale for glucose cover and monitoring. At the time of his discharge will resume his oral antihyperglycemic  agents.   Fasting glucose today is 281 mg/dl.   Continue with statin therapy.   History of stroke Continue with rosuvastatin and clopidogrel.  Patient will have a formal swallow evaluation prior to his discharge.  At home with soft diet to prevent aspiration.   Microcytic anemia Hgb stable.   Dementia (HCC) Acute metabolic encephalopathy.  His is blind but his wife has been at his bedside.   Patient required mittens during his hospitalization and as needed hydroxyzine.  At the time of his discharge he is back to his baseline, with no agitation.  Plan to discharge home under the care of his wife.          Consultants: none  Procedures performed: none   Disposition: Home Diet recommendation:  Cardiac and Carb modified diet DISCHARGE MEDICATION: Allergies as of 11/14/2022       Reactions   Penicillins Other (See Comments)   Lisinopril Cough   Penicillin G Hives, Rash        Medication List     TAKE these medications    acetaminophen 325 MG tablet Commonly known as: TYLENOL Take 2 tablets (650 mg total) by mouth every 4 (four) hours as needed for mild pain (or temp > 37.5 C (99.5 F)).   albuterol 108 (90 Base) MCG/ACT inhaler Commonly known as: VENTOLIN HFA Inhale 2 puffs into the lungs every 4 (four) hours as needed for wheezing or shortness of breath.   citalopram 10 MG tablet Commonly known as: CELEXA Take 10 mg by mouth daily.   clopidogrel 75 MG tablet Commonly known as: PLAVIX Take 1 tablet (75 mg total) by mouth daily.   donepezil 10 MG tablet Commonly known as: ARICEPT Take 1 tablet (10 mg total) by mouth at bedtime.   furosemide 40 MG tablet Commonly known as: LASIX Take 1 tablet (40 mg total) by mouth daily.   glimepiride 1 MG tablet Commonly known as: AMARYL Take 1 tablet (1 mg total) by mouth every morning.   hydrALAZINE 10 MG tablet Commonly known as: APRESOLINE Take 1 tablet (10 mg total) by mouth every 8 (eight) hours.    ipratropium-albuterol 0.5-2.5 (3) MG/3ML Soln Commonly known as: DUONEB Take 3 mLs by nebulization every 6 (six) hours as needed (SOB).   isosorbide mononitrate 30 MG 24 hr tablet Commonly known as: IMDUR Take 1 tablet (30 mg total) by mouth daily.   metFORMIN 500 MG tablet Commonly known as: GLUCOPHAGE Take 1 tablet (500 mg total) by mouth daily with breakfast.   pantoprazole 40 MG tablet Commonly known as: Protonix Take 1 tablet (40 mg total) by mouth daily.   predniSONE 20 MG tablet Commonly known as: DELTASONE Take 20 mg by mouth 2 (two) times daily.   rosuvastatin 20 MG tablet Commonly known as: CRESTOR Take 1 tablet (20 mg total) by mouth daily.   Symbicort 80-4.5 MCG/ACT inhaler Generic drug: budesonide-formoterol Inhale 2 puffs into the lungs 2 (two) times daily.        Follow-up Information     [facility] Follow up.   Specialty: Home Health Services Why: [facility]- Physical and Occupational Therapy/Aide-office to call with visit times. Contact information: [address] [place] [address]  [phone]                Discharge Exam: Filed Weights   11/12/22 0539 11/13/22 0536 11/14/22 0450  Weight: 79.7 kg 74.4 kg 74.3 kg   BP 102/78 (BP Location: Left Arm)   Pulse 97   Temp 98.3 F (36.8 C) (Oral)   Resp 15   Ht 5\' 3"  (1.6 m)   Wt 74.3 kg   SpO2 99%   BMI 29.02 kg/m   Patient with no chest pain and no dyspnea, no edema, no PND or orthopnea.   Neurology awake and alert, no agitation  ENT with mild pallor Cardiovascular with S1 and S2 present and regular with no gallops, rubs or murmurs No JVD No lower extremity edema Respiratory with no rales or wheezing, no rhonchi Abdomen with no distention  Condition at discharge: stable  The results of significant diagnostics from this hospitalization (including imaging, microbiology, ancillary and laboratory) are listed below for reference.   Imaging  Studies: ECHOCARDIOGRAM COMPLETE  Result Date: 11/13/2022    ECHOCARDIOGRAM REPORT   Patient Name:   Narek Leno. Date of Exam: 11/13/2022 Medical Rec #:  [id number]          Height:       63.0 in Accession #:    [id number]         Weight:       164.0 lb Date of Birth:  Apr 28, 1928          BSA:          1.777 m Patient Age:    89 years           BP:           110/71 mmHg Patient Gender: M                  HR:           103 bpm. Exam Location:  Inpatient Procedure: 2D Echo, Cardiac Doppler and Color Doppler Indications:    Dyspnea R06.00  History:        Patient has prior history of Echocardiogram examinations, most                 recent 04/10/2022. Cardiomyopathy and CHF, Stroke,                 Signs/Symptoms:Chest Pain; Risk Factors:Diabetes.  Sonographer:    Lucendia Herrlich Referring Phys: [id number] Dorena Dorfman DANIEL Kennedie Pardoe  Sonographer Comments: Image acquisition challenging due to patient behavioral factors. and Image acquisition challenging due to respiratory motion. IMPRESSIONS  1. Image quality is limited. Left ventricular ejection fraction, by estimation, is 60 to 65%. The left ventricle has normal function. The left ventricle has no regional wall motion abnormalities. There is moderate left ventricular hypertrophy. Left ventricular diastolic parameters are consistent with Grade I diastolic dysfunction (impaired relaxation).  2. Right ventricular systolic function is normal. The right ventricular size is normal.  3. The mitral valve is degenerative. Trivial mitral valve regurgitation.  4. There is moderate calcification of the aortic valve. Aortic valve regurgitation is not visualized. FINDINGS  Left Ventricle: Image quality is limited. Left ventricular ejection fraction, by estimation, is 60 to 65%. The left ventricle has normal function. The left ventricle has no regional wall motion abnormalities. The left ventricular internal cavity size was normal in size. There is moderate left ventricular  hypertrophy. Left ventricular diastolic parameters are consistent with Grade I diastolic dysfunction (impaired relaxation). Right Ventricle: The right  ventricular size is normal. Right ventricular systolic function is normal. Left Atrium: Left atrial size was normal in size. Right Atrium: Right atrial size was normal in size. Pericardium: There is no evidence of pericardial effusion. Mitral Valve: The mitral valve is degenerative in appearance. Trivial mitral valve regurgitation. MV peak gradient, 12.2 mmHg. The mean mitral valve gradient is 4.0 mmHg. Tricuspid Valve: Tricuspid valve regurgitation is trivial. Aortic Valve: There is moderate calcification of the aortic valve. Aortic valve regurgitation is not visualized. Aortic valve mean gradient measures 7.0 mmHg. Aortic valve peak gradient measures 14.0 mmHg. Aortic valve area, by VTI measures 1.47 cm. Pulmonic Valve: Pulmonic valve regurgitation is not visualized. Aorta: The aortic root is normal in size and structure. IAS/Shunts: The interatrial septum was not well visualized.  LEFT VENTRICLE PLAX 2D LVIDd:         3.30 cm   Diastology LVIDs:         2.30 cm   LV e' medial:    5.17 cm/s LV PW:         1.05 cm   LV E/e' medial:  15.5 LV IVS:        1.35 cm   LV e' lateral:   8.17 cm/s LVOT diam:     1.80 cm   LV E/e' lateral: 9.8 LV SV:         42 LV SV Index:   24 LVOT Area:     2.54 cm  LEFT ATRIUM           Index        RIGHT ATRIUM          Index LA diam:      3.00 cm 1.69 cm/m   RA Area:     5.64 cm LA Vol (A4C): 20.8 ml 11.70 ml/m  RA Volume:   5.58 ml  3.14 ml/m  AORTIC VALVE AV Area (Vmax):    1.42 cm AV Area (Vmean):   1.48 cm AV Area (VTI):     1.47 cm AV Vmax:           187.00 cm/s AV Vmean:          126.000 cm/s AV VTI:            0.286 m AV Peak Grad:      14.0 mmHg AV Mean Grad:      7.0 mmHg LVOT Vmax:         104.00 cm/s LVOT Vmean:        73.233 cm/s LVOT VTI:          0.166 m LVOT/AV VTI ratio: 0.58  AORTA Ao Root diam: 3.20 cm MITRAL  VALVE                TRICUSPID VALVE MV Area (PHT): 4.49 cm     TR Peak grad:   9.9 mmHg MV Peak grad:  12.2 mmHg    TR Vmax:        157.00 cm/s MV Mean grad:  4.0 mmHg MV Vmax:       1.74 m/s     SHUNTS MV Vmean:      89.3 cm/s    Systemic VTI:  0.17 m MV Decel Time: 169 msec     Systemic Diam: 1.80 cm MV E velocity: 80.00 cm/s MV A velocity: 163.00 cm/s MV E/A ratio:  0.49 [place] signed by Carolan Clines Signature Date/Time: 11/13/2022/9:14:38 AM    Final    US RENAL  Result Date: 11/11/2022 CLINICAL DATA:  89 year old male with acute renal insufficiency. EXAM: RENAL / URINARY TRACT ULTRASOUND COMPLETE COMPARISON:  None Available. FINDINGS: Right Kidney: Renal measurements: 9.7 x 4.4 x 4.9 cm = volume: 99 mL. Maintained cortical echogenicity and volume for age (image 76). No right hydronephrosis or renal mass. Left Kidney: Renal measurements: 8.6 x 5.0 x 4.2 cm = volume: 95 mL. Relatively maintained left renal cortical echogenicity and cortical volume (image 4). No left hydronephrosis or renal mass. Bladder: Appears normal for degree of bladder distention. Other: None. IMPRESSION: Normal for age ultrasound appearance of both kidneys and the urinary bladder. Electronically Signed   By: Odessa Fleming M.D.   On: 11/11/2022 06:21   DG Chest 1 View  Result Date: 11/10/2022 CLINICAL DATA:  Substernal chest pain. EXAM: CHEST  1 VIEW COMPARISON:  September 02, 2022 FINDINGS: The heart size and mediastinal contours are within normal limits. Mild diffusely increased interstitial lung markings are seen. This is increased in severity when compared to the prior study. Mild perihilar prominence of the pulmonary vasculature is also noted. No pleural effusion or pneumothorax is identified. Multilevel degenerative changes seen throughout the thoracic spine. IMPRESSION: Mild pulmonary vascular congestion with mild interstitial edema. Electronically Signed   By: Aram Candela M.D.   On: 11/10/2022 21:12     Microbiology: Results for orders placed or performed during the hospital encounter of 04/09/22  Resp panel by RT-PCR (RSV, Flu A&B, Covid) Anterior Nasal Swab     Status: None   Collection Time: 04/09/22 12:37 PM   Specimen: Anterior Nasal Swab  Result Value Ref Range Status   SARS Coronavirus 2 by RT PCR NEGATIVE NEGATIVE Final   Influenza A by PCR NEGATIVE NEGATIVE Final   Influenza B by PCR NEGATIVE NEGATIVE Final    Comment: (NOTE) The Xpert Xpress SARS-CoV-2/FLU/RSV plus assay is intended as an aid in the diagnosis of influenza from Nasopharyngeal swab specimens and should not be used as a sole basis for treatment. Nasal washings and aspirates are unacceptable for Xpert Xpress SARS-CoV-2/FLU/RSV testing.  Fact Sheet for Patients: BloggerCourse.com  Fact Sheet for Healthcare Providers: SeriousBroker.it  This test is not yet approved or cleared by the [place] FDA and has been authorized for detection and/or diagnosis of SARS-CoV-2 by FDA under an Emergency Use Authorization (EUA). This EUA will remain in effect (meaning this test can be used) for the duration of the COVID-19 declaration under Section 564(b)(1) of the Act, 21 U.S.C. section 360bbb-3(b)(1), unless the authorization is terminated or revoked.     Resp Syncytial Virus by PCR NEGATIVE NEGATIVE Final    Comment: (NOTE) Fact Sheet for Patients: BloggerCourse.com  Fact Sheet for Healthcare Providers: SeriousBroker.it  This test is not yet approved or cleared by the [place] FDA and has been authorized for detection and/or diagnosis of SARS-CoV-2 by FDA under an Emergency Use Authorization (EUA). This EUA will remain in effect (meaning this test can be used) for the duration of the COVID-19 declaration under Section 564(b)(1) of the Act, 21 U.S.C. section 360bbb-3(b)(1), unless the authorization is  terminated or revoked.  Performed at [facility], [address] [place], [place] [address]     Labs: CBC: Recent Labs  Lab 11/10/22 2014 11/11/22 0413  WBC 6.9 7.1  HGB 9.4* 9.4*  HCT 30.0* 30.3*  MCV 76.1* 79.1*  PLT 193 180   Basic Metabolic Panel: Recent Labs  Lab 11/10/22 2014 11/11/22 0413 11/12/22 0420 11/13/22 0442 11/14/22  0406  NA 136 136 140 140 136  K 4.2 4.2 3.4* 3.7 4.1  CL 103 102 100 99 101  CO2 18* 20* 25 27 23   GLUCOSE 296* 356* 118* 163* 281*  BUN 30* 29* 26* 30* 33*  CREATININE 1.93* 1.74* 1.89* 2.16* 1.85*  CALCIUM 8.8* 8.4* 8.9 9.0 8.8*  MG  --  1.9 2.0 1.9  --   PHOS  --   --   --   --  3.3   Liver Function Tests: Recent Labs  Lab 11/14/22 0406  ALBUMIN 3.2*   CBG: Recent Labs  Lab 11/13/22 0714 11/13/22 1140 11/13/22 1604 11/13/22 2123 11/14/22 0651  GLUCAP 158* 302* 188* 364* 261*    Discharge time spent: greater than 30 minutes.  Signed: Coralie Keens, MD [facility] 11/14/2022

## 2022-11-14 NOTE — Progress Notes (Signed)
Physical Therapy Treatment Patient Details Name: Grant Jensen. MRN: [id number] DOB: November 19, 1928 Today's Date: 11/14/2022   History of Present Illness 89 yo male admitted 9/6 who presented with dyspnea. Patient with several weeks of dyspnea and cough, treated as outpatient with systemic steroids with not much improvement in his symptoms. PMH: hypertension, T2DM and Alzheimer's dementia, blindness    PT Comments  Pt resting in chair upon PT arrival to room, pt and wife agreeable to PT session prior to d/c. Pt demonstrating LE weakness and decreased activity tolerance, pt starting/stopping multiple times during gait and has min knee buckling which pt corrects himself. Pt's wife states understanding that pt is weaker and will not have same level of tolerance as before admission. Pt's wife also demonstrated ability to physically assist him during both transfers and gait at d/c. Plan remains appropriate.     If plan is discharge home, recommend the following: A little help with walking and/or transfers;A little help with bathing/dressing/bathroom;Assistance with cooking/housework;Assist for transportation;Help with stairs or ramp for entrance   Can travel by private vehicle        Equipment Recommendations  None recommended by PT    Recommendations for Other Services       Precautions / Restrictions Precautions Precautions: Fall Precaution Comments: Pt is blind Restrictions Weight Bearing Restrictions: No     Mobility  Bed Mobility Overal bed mobility: Needs Assistance             General bed mobility comments: up in chair    Transfers Overall transfer level: Needs assistance Equipment used: Rolling walker (2 wheels) Transfers: Sit to/from Stand Sit to Stand: Mod assist           General transfer comment: assist for rise and steady, stand x3 from recliner.    Ambulation/Gait Ambulation/Gait assistance: Min assist Gait Distance (Feet): 60 Feet Assistive device:  Rolling walker (2 wheels) Gait Pattern/deviations: Step-through pattern, Decreased stride length, Trunk flexed, Wide base of support, Drifts right/left, Leaning posteriorly, Shuffle Gait velocity: decr     General Gait Details: assist to steady, manage RW, and direct pt given visual impairment. Pt with LE fatiguing and knees with min buckling (pt-corrected), multiple stop/starts during gait   Stairs             Wheelchair Mobility     Tilt Bed    Modified Rankin (Stroke Patients Only)       Balance Overall balance assessment: Needs assistance Sitting-balance support: No upper extremity supported, Feet supported Sitting balance-Leahy Scale: Fair     Standing balance support: Bilateral upper extremity supported, During functional activity Standing balance-Leahy Scale: Poor Standing balance comment: relies on UE and external support                            Cognition Arousal: Alert Behavior During Therapy: Flat affect Overall Cognitive Status: History of cognitive impairments - at baseline                                 General Comments: history of dementia        Exercises      General Comments General comments (skin integrity, edema, etc.): HR 106 bpm post-gait      Pertinent Vitals/Pain Pain Assessment Pain Assessment: Faces Faces Pain Scale: Hurts a little bit Pain Location: LEs with fatigue Pain Descriptors / Indicators: Grimacing Pain Intervention(s):  Limited activity within patient's tolerance, Monitored during session, Repositioned    Home Living                          Prior Function            PT Goals (current goals can now be found in the care plan section) Acute Rehab PT Goals Patient Stated Goal: to go home PT Goal Formulation: With patient Time For Goal Achievement: 11/26/22 Potential to Achieve Goals: Good Progress towards PT goals: Progressing toward goals    Frequency            PT Plan      Co-evaluation              AM-PAC PT "6 Clicks" Mobility   Outcome Measure  Help needed turning from your back to your side while in a flat bed without using bedrails?: A Little Help needed moving from lying on your back to sitting on the side of a flat bed without using bedrails?: A Little Help needed moving to and from a bed to a chair (including a wheelchair)?: A Little Help needed standing up from a chair using your arms (e.g., wheelchair or bedside chair)?: A Lot Help needed to walk in hospital room?: A Lot Help needed climbing 3-5 steps with a railing? : Total 6 Click Score: 14    End of Session Equipment Utilized During Treatment: Gait belt Activity Tolerance: Patient limited by fatigue Patient left: in chair;with call bell/phone within reach;with chair alarm set;with family/visitor present Nurse Communication: Mobility status PT Visit Diagnosis: Unsteadiness on feet (R26.81)     Time: 8119-1478 PT Time Calculation (min) (ACUTE ONLY): 20 min  Charges:    $Gait Training: 8-22 mins PT General Charges $$ ACUTE PT VISIT: 1 Visit                     Marye Round, PT DPT Acute Rehabilitation Services Secure Chat Preferred  Office [phone]    Dellene Mcgroarty Sheliah Plane 11/14/2022, 12:27 PM

## 2022-11-14 NOTE — Progress Notes (Signed)
Please note that documentation from nursing dated 11/13/2022 6:34 pm seems to correspond to different patient.  [name] did not require Bipap and had no significant change in his vital sings yesterday.

## 2022-11-14 NOTE — Care Management Important Message (Signed)
Important Message  Patient Details  Name: Grant Jensen. MRN: [id number] Date of Birth: [birth year]   Medicare Important Message Given:  Yes     Sherilyn Banker 11/14/2022, 12:54 PM

## 2022-12-18 ENCOUNTER — Telehealth: Payer: Self-pay | Admitting: Neurology

## 2022-12-18 NOTE — Telephone Encounter (Signed)
Pt cancelled appointment due to death in the family. Will call back to reschedule.

## 2022-12-19 ENCOUNTER — Ambulatory Visit: Payer: Medicare Other | Admitting: Adult Health

## 2023-01-03 ENCOUNTER — Emergency Department (HOSPITAL_COMMUNITY): Payer: Medicare Other

## 2023-01-03 ENCOUNTER — Other Ambulatory Visit: Payer: Self-pay

## 2023-01-03 ENCOUNTER — Observation Stay (HOSPITAL_COMMUNITY)
Admission: EM | Admit: 2023-01-03 | Discharge: 2023-01-04 | Disposition: A | Discharge status: Home / Self Care | Payer: Medicare Other | Attending: Internal Medicine | Admitting: Internal Medicine

## 2023-01-03 ENCOUNTER — Encounter (HOSPITAL_COMMUNITY): Payer: Self-pay | Admitting: Emergency Medicine

## 2023-01-03 DIAGNOSIS — E1122 Type 2 diabetes mellitus with diabetic chronic kidney disease: Secondary | ICD-10-CM | POA: Insufficient documentation

## 2023-01-03 DIAGNOSIS — F0390 Unspecified dementia without behavioral disturbance: Secondary | ICD-10-CM | POA: Diagnosis not present

## 2023-01-03 DIAGNOSIS — G9341 Metabolic encephalopathy: Secondary | ICD-10-CM | POA: Diagnosis not present

## 2023-01-03 DIAGNOSIS — I5032 Chronic diastolic (congestive) heart failure: Secondary | ICD-10-CM | POA: Diagnosis not present

## 2023-01-03 DIAGNOSIS — E1169 Type 2 diabetes mellitus with other specified complication: Secondary | ICD-10-CM

## 2023-01-03 DIAGNOSIS — R400 Somnolence: Secondary | ICD-10-CM

## 2023-01-03 DIAGNOSIS — Z8673 Personal history of transient ischemic attack (TIA), and cerebral infarction without residual deficits: Secondary | ICD-10-CM | POA: Insufficient documentation

## 2023-01-03 DIAGNOSIS — R4781 Slurred speech: Secondary | ICD-10-CM | POA: Diagnosis present

## 2023-01-03 DIAGNOSIS — I130 Hypertensive heart and chronic kidney disease with heart failure and stage 1 through stage 4 chronic kidney disease, or unspecified chronic kidney disease: Secondary | ICD-10-CM | POA: Insufficient documentation

## 2023-01-03 DIAGNOSIS — I10 Essential (primary) hypertension: Secondary | ICD-10-CM | POA: Diagnosis present

## 2023-01-03 DIAGNOSIS — N1832 Chronic kidney disease, stage 3b: Secondary | ICD-10-CM | POA: Insufficient documentation

## 2023-01-03 DIAGNOSIS — Z79899 Other long term (current) drug therapy: Secondary | ICD-10-CM | POA: Insufficient documentation

## 2023-01-03 DIAGNOSIS — Z7984 Long term (current) use of oral hypoglycemic drugs: Secondary | ICD-10-CM | POA: Diagnosis not present

## 2023-01-03 DIAGNOSIS — Z7902 Long term (current) use of antithrombotics/antiplatelets: Secondary | ICD-10-CM | POA: Insufficient documentation

## 2023-01-03 DIAGNOSIS — G9340 Encephalopathy, unspecified: Secondary | ICD-10-CM | POA: Diagnosis present

## 2023-01-03 DIAGNOSIS — R479 Unspecified speech disturbances: Principal | ICD-10-CM

## 2023-01-03 LAB — CBC
HCT: 35.4 % — ABNORMAL LOW (ref 39.0–52.0)
Hemoglobin: 10.8 g/dL — ABNORMAL LOW (ref 13.0–17.0)
MCH: 24.2 pg — ABNORMAL LOW (ref 26.0–34.0)
MCHC: 30.5 g/dL (ref 30.0–36.0)
MCV: 79.4 fL — ABNORMAL LOW (ref 80.0–100.0)
Platelets: 217 10*3/uL (ref 150–400)
RBC: 4.46 MIL/uL (ref 4.22–5.81)
RDW: 15 % (ref 11.5–15.5)
WBC: 7.4 10*3/uL (ref 4.0–10.5)
nRBC: 0 % (ref 0.0–0.2)

## 2023-01-03 LAB — TSH: TSH: 4.155 u[IU]/mL (ref 0.350–4.500)

## 2023-01-03 LAB — DIFFERENTIAL
Abs Immature Granulocytes: 0.01 10*3/uL (ref 0.00–0.07)
Basophils Absolute: 0.1 10*3/uL (ref 0.0–0.1)
Basophils Relative: 1 %
Eosinophils Absolute: 0.4 10*3/uL (ref 0.0–0.5)
Eosinophils Relative: 6 %
Immature Granulocytes: 0 %
Lymphocytes Relative: 37 %
Lymphs Abs: 2.8 10*3/uL (ref 0.7–4.0)
Monocytes Absolute: 0.6 10*3/uL (ref 0.1–1.0)
Monocytes Relative: 8 %
Neutro Abs: 3.6 10*3/uL (ref 1.7–7.7)
Neutrophils Relative %: 48 %

## 2023-01-03 LAB — I-STAT CHEM 8, ED
BUN: 19 mg/dL (ref 8–23)
Calcium, Ion: 1.14 mmol/L — ABNORMAL LOW (ref 1.15–1.40)
Chloride: 104 mmol/L (ref 98–111)
Creatinine, Ser: 1.6 mg/dL — ABNORMAL HIGH (ref 0.61–1.24)
Glucose, Bld: 135 mg/dL — ABNORMAL HIGH (ref 70–99)
HCT: 37 % — ABNORMAL LOW (ref 39.0–52.0)
Hemoglobin: 12.6 g/dL — ABNORMAL LOW (ref 13.0–17.0)
Potassium: 3.4 mmol/L — ABNORMAL LOW (ref 3.5–5.1)
Sodium: 141 mmol/L (ref 135–145)
TCO2: 24 mmol/L (ref 22–32)

## 2023-01-03 LAB — COMPREHENSIVE METABOLIC PANEL
ALT: 17 U/L (ref 0–44)
AST: 30 U/L (ref 15–41)
Albumin: 3.9 g/dL (ref 3.5–5.0)
Alkaline Phosphatase: 61 U/L (ref 38–126)
Anion gap: 14 (ref 5–15)
BUN: 18 mg/dL (ref 8–23)
CO2: 23 mmol/L (ref 22–32)
Calcium: 9.4 mg/dL (ref 8.9–10.3)
Chloride: 103 mmol/L (ref 98–111)
Creatinine, Ser: 1.68 mg/dL — ABNORMAL HIGH (ref 0.61–1.24)
GFR, Estimated: 38 mL/min — ABNORMAL LOW (ref >60)
Glucose, Bld: 142 mg/dL — ABNORMAL HIGH (ref 70–99)
Potassium: 3.5 mmol/L (ref 3.5–5.1)
Sodium: 140 mmol/L (ref 135–145)
Total Bilirubin: 0.4 mg/dL (ref 0.3–1.2)
Total Protein: 7 g/dL (ref 6.5–8.1)

## 2023-01-03 LAB — BRAIN NATRIURETIC PEPTIDE: B Natriuretic Peptide: 53.9 pg/mL (ref 0.0–100.0)

## 2023-01-03 LAB — PROTIME-INR
INR: 1 (ref 0.8–1.2)
Prothrombin Time: 13.5 s (ref 11.4–15.2)

## 2023-01-03 LAB — CBG MONITORING, ED
Glucose-Capillary: 117 mg/dL — ABNORMAL HIGH (ref 70–99)
Glucose-Capillary: 91 mg/dL (ref 70–99)

## 2023-01-03 LAB — ETHANOL: Alcohol, Ethyl (B): <10 mg/dL (ref <10)

## 2023-01-03 LAB — APTT: aPTT: 28 s (ref 24–36)

## 2023-01-03 MED ORDER — CLOPIDOGREL BISULFATE 75 MG PO TABS
75.0000 mg | ORAL_TABLET | Freq: Every day | ORAL | Status: DC
  Administered 2023-01-04: 75 mg via ORAL
  Filled 2023-01-03: qty 1

## 2023-01-03 MED ORDER — HYDRALAZINE HCL 10 MG PO TABS
10.0000 mg | ORAL_TABLET | Freq: Two times a day (BID) | ORAL | Status: DC
  Administered 2023-01-04: 10 mg via ORAL
  Filled 2023-01-03: qty 1

## 2023-01-03 MED ORDER — SODIUM CHLORIDE 0.9% FLUSH: 3.0000 mL | Freq: Once | INTRAVENOUS | Status: DC

## 2023-01-03 MED ORDER — IPRATROPIUM-ALBUTEROL 0.5-2.5 (3) MG/3ML IN SOLN: 3.0000 mL | Freq: Four times a day (QID) | RESPIRATORY_TRACT | Status: DC | PRN

## 2023-01-03 MED ORDER — INSULIN ASPART 100 UNIT/ML IJ SOLN
0.0000 [IU] | Freq: Three times a day (TID) | INTRAMUSCULAR | Status: DC
  Administered 2023-01-04: 1 [IU] via SUBCUTANEOUS

## 2023-01-03 MED ORDER — CITALOPRAM HYDROBROMIDE 10 MG PO TABS
10.0000 mg | ORAL_TABLET | Freq: Every day | ORAL | Status: DC
  Administered 2023-01-04: 10 mg via ORAL
  Filled 2023-01-03: qty 1

## 2023-01-03 MED ORDER — ISOSORBIDE MONONITRATE ER 30 MG PO TB24
30.0000 mg | ORAL_TABLET | Freq: Every day | ORAL | Status: DC
  Administered 2023-01-04: 30 mg via ORAL
  Filled 2023-01-03: qty 1

## 2023-01-03 MED ORDER — MOMETASONE FURO-FORMOTEROL FUM 100-5 MCG/ACT IN AERO
2.0000 | INHALATION_SPRAY | Freq: Two times a day (BID) | RESPIRATORY_TRACT | Status: DC
  Administered 2023-01-04: 2 via RESPIRATORY_TRACT
  Filled 2023-01-03: qty 8.8

## 2023-01-03 MED ORDER — PANTOPRAZOLE SODIUM 40 MG PO TBEC
40.0000 mg | DELAYED_RELEASE_TABLET | Freq: Every day | ORAL | Status: DC
  Administered 2023-01-04: 40 mg via ORAL
  Filled 2023-01-03: qty 1

## 2023-01-03 MED ORDER — HEPARIN SODIUM (PORCINE) 5000 UNIT/ML IJ SOLN
5000.0000 [IU] | Freq: Three times a day (TID) | INTRAMUSCULAR | Status: DC
  Administered 2023-01-03 – 2023-01-04 (×3): 5000 [IU] via SUBCUTANEOUS
  Filled 2023-01-03 (×3): qty 1

## 2023-01-03 MED ORDER — FUROSEMIDE 40 MG PO TABS
40.0000 mg | ORAL_TABLET | Freq: Every day | ORAL | Status: DC
  Administered 2023-01-04: 40 mg via ORAL
  Filled 2023-01-03: qty 1

## 2023-01-03 MED ORDER — LORAZEPAM 2 MG/ML IJ SOLN: 2.0000 mg | INTRAMUSCULAR | Status: DC | PRN

## 2023-01-03 MED ORDER — ACETAMINOPHEN 325 MG PO TABS: 650.0000 mg | ORAL_TABLET | Freq: Four times a day (QID) | ORAL | Status: DC | PRN

## 2023-01-03 MED ORDER — SODIUM CHLORIDE 0.9% FLUSH
3.0000 mL | Freq: Two times a day (BID) | INTRAVENOUS | Status: DC
  Administered 2023-01-03 – 2023-01-04 (×2): 3 mL via INTRAVENOUS

## 2023-01-03 MED ORDER — INSULIN ASPART 100 UNIT/ML IJ SOLN
0.0000 [IU] | Freq: Every day | INTRAMUSCULAR | Status: DC
Start: 2023-01-03 — End: 2023-01-04

## 2023-01-03 MED ORDER — ACETAMINOPHEN 650 MG RE SUPP: 650.0000 mg | Freq: Four times a day (QID) | RECTAL | Status: DC | PRN

## 2023-01-03 MED ORDER — IOHEXOL 350 MG/ML SOLN
60.0000 mL | Freq: Once | INTRAVENOUS | Status: AC | PRN
  Administered 2023-01-03: 60 mL via INTRAVENOUS

## 2023-01-03 MED ORDER — SENNOSIDES-DOCUSATE SODIUM 8.6-50 MG PO TABS: 1.0000 | ORAL_TABLET | Freq: Every evening | ORAL | Status: DC | PRN

## 2023-01-03 MED ORDER — ROSUVASTATIN CALCIUM 20 MG PO TABS
20.0000 mg | ORAL_TABLET | Freq: Every day | ORAL | Status: DC
  Administered 2023-01-04: 20 mg via ORAL
  Filled 2023-01-03: qty 1

## 2023-01-03 MED ORDER — LORAZEPAM 2 MG/ML IJ SOLN: 2.0000 mg | Freq: Once | INTRAMUSCULAR | Status: DC | PRN

## 2023-01-03 NOTE — Progress Notes (Signed)
VERY difficult hookup. Pt fighting during gluing process. Once glueing was over, pt lying calmly on bed. Head wrapped, Bhagat notified. As per Bhagat, if pt pulls wires off head, ok to check with Neuro for possible DC. If nothing abnormal on eeg, DC should be ok. Will monitor pt in office for maintainance

## 2023-01-03 NOTE — ED Notes (Signed)
In and out cath attempted, unable to place due to prostate complications. Condom cath to be used instead.

## 2023-01-03 NOTE — ED Notes (Signed)
Condom cath placed on patient in attempt to obtain urine specimen.

## 2023-01-03 NOTE — ED Provider Triage Note (Signed)
Emergency Medicine Provider Triage Evaluation Note  Grant Jensen , a 89 y.o. male  was evaluated in triage.  Pt complains of AMS.  Review of Systems  Positive: Slurred speech, weakness, confusion Negative: Fever, vomiting  Physical Exam  BP (!) 142/81 (BP Location: Left Arm)   Pulse 85   Temp 97.7 F (36.5 C)   Resp 18   SpO2 100%  Gen:   Awake, no distress   Resp:  Normal effort  MSK:   Moves extremities without difficulty Not participating in exam - left hand grip, right hand actively resisting movement but not following command.  Other:  Speech garbled, not normal per wife. No obvious facial droop. Strong smell of urine.  Medical Decision Making  Medically screening exam initiated at 12:24 PM.  Appropriate orders placed.  Grant Jensen. was informed that the remainder of the evaluation will be completed by another provider, this initial triage assessment does not replace that evaluation, and the importance of remaining in the ED until their evaluation is complete.  Ddx: new CVA (last seen normal last evening) vs infection   Elpidio Anis, PA-C 01/03/23 1227

## 2023-01-03 NOTE — ED Provider Notes (Signed)
[place] EMERGENCY DEPARTMENT AT [facility] Provider Note   CSN: [id number] Arrival date & time: 01/03/23  1155     History  Chief Complaint  Patient presents with   Neurologic Problem    Grant Whitmill. is a 89 y.o. male.  The history is provided by the patient and medical records. No language interpreter was used.  Neurologic Problem This is a new problem. The current episode started 12 to 24 hours ago. The problem occurs constantly. The problem has not changed since onset.Associated symptoms include headaches. Pertinent negatives include no chest pain, no abdominal pain and no shortness of breath. Nothing aggravates the symptoms. Nothing relieves the symptoms.       Home Medications Prior to Admission medications   Medication Sig Start Date End Date Taking? Authorizing Provider  acetaminophen (TYLENOL) 325 MG tablet Take 2 tablets (650 mg total) by mouth every 4 (four) hours as needed for mild pain (or temp > 37.5 C (99.5 F)). 04/18/22   Angiulli, Mcarthur Rossetti, PA-C  albuterol (VENTOLIN HFA) 108 (90 Base) MCG/ACT inhaler Inhale 2 puffs into the lungs every 4 (four) hours as needed for wheezing or shortness of breath. 12/06/21   [provider]  citalopram (CELEXA) 10 MG tablet Take 10 mg by mouth daily. 10/31/22   [provider]  clopidogrel (PLAVIX) 75 MG tablet Take 1 tablet (75 mg total) by mouth daily. 04/18/22   Angiulli, Mcarthur Rossetti, PA-C  donepezil (ARICEPT) 10 MG tablet Take 1 tablet (10 mg total) by mouth at bedtime. 06/06/22   Micki Riley, MD  furosemide (LASIX) 40 MG tablet Take 1 tablet (40 mg total) by mouth daily. 11/14/22 12/14/22  Arrien, York Ram, MD  glimepiride (AMARYL) 1 MG tablet Take 1 tablet (1 mg total) by mouth every morning. 04/18/22   Angiulli, Mcarthur Rossetti, PA-C  hydrALAZINE (APRESOLINE) 10 MG tablet Take 1 tablet (10 mg total) by mouth in the morning and at bedtime. 11/14/22 12/14/22  Arrien, York Ram, MD   ipratropium-albuterol (DUONEB) 0.5-2.5 (3) MG/3ML SOLN Take 3 mLs by nebulization every 6 (six) hours as needed (SOB). 12/09/21   [provider]  isosorbide mononitrate (IMDUR) 30 MG 24 hr tablet Take 1 tablet (30 mg total) by mouth daily. 04/19/22   Angiulli, Mcarthur Rossetti, PA-C  metFORMIN (GLUCOPHAGE) 500 MG tablet Take 1 tablet (500 mg total) by mouth daily with breakfast. 04/18/22   Angiulli, Mcarthur Rossetti, PA-C  pantoprazole (PROTONIX) 40 MG tablet Take 1 tablet (40 mg total) by mouth daily. 04/18/22 04/18/23  Angiulli, Mcarthur Rossetti, PA-C  predniSONE (DELTASONE) 20 MG tablet Take 20 mg by mouth 2 (two) times daily.    [provider]  rosuvastatin (CRESTOR) 20 MG tablet Take 1 tablet (20 mg total) by mouth daily. 04/18/22   Angiulli, Mcarthur Rossetti, PA-C  SYMBICORT 80-4.5 MCG/ACT inhaler Inhale 2 puffs into the lungs 2 (two) times daily. 11/08/22   [provider]      Allergies    Penicillins, Lisinopril, and Penicillin g    Review of Systems   Review of Systems  Constitutional:  Negative for chills, fatigue and fever.  HENT:  Negative for congestion.   Eyes:  Negative for visual disturbance.  Respiratory:  Negative for cough, chest tightness, shortness of breath and wheezing.   Cardiovascular:  Negative for chest pain.  Gastrointestinal:  Negative for abdominal pain, constipation, diarrhea, nausea and vomiting.  Genitourinary:  Negative for dysuria and flank pain.  Musculoskeletal:  Negative for back pain, neck pain and neck stiffness.  Skin:  Negative for rash and wound.  Neurological:  Positive for speech difficulty, weakness (generalized per family) and headaches. Negative for light-headedness and numbness.  Psychiatric/Behavioral:  Negative for agitation and confusion.   All other systems reviewed and are negative.   Physical Exam Updated Vital Signs BP (!) 142/81 (BP Location: Left Arm)   Pulse 85   Temp 97.7 F (36.5 C)   Resp 18   SpO2 100%  Physical Exam Vitals  and nursing note reviewed.  Constitutional:      General: He is not in acute distress.    Appearance: He is well-developed. He is not ill-appearing, toxic-appearing or diaphoretic.  HENT:     Head: Normocephalic and atraumatic.     Nose: No congestion or rhinorrhea.     Mouth/Throat:     Pharynx: No oropharyngeal exudate or posterior oropharyngeal erythema.  Eyes:     Conjunctiva/sclera: Conjunctivae normal.     Pupils: Pupils are equal, round, and reactive to light.  Cardiovascular:     Rate and Rhythm: Normal rate and regular rhythm.     Heart sounds: No murmur heard. Pulmonary:     Effort: Pulmonary effort is normal. No respiratory distress.     Breath sounds: Normal breath sounds. No wheezing, rhonchi or rales.  Chest:     Chest wall: No tenderness.  Abdominal:     General: Abdomen is flat.     Palpations: Abdomen is soft.     Tenderness: There is no abdominal tenderness. There is no right CVA tenderness, left CVA tenderness, guarding or rebound.  Musculoskeletal:        General: No swelling or tenderness.     Cervical back: Neck supple. No tenderness.     Right lower leg: Edema (mild) present.     Left lower leg: Edema (mild) present.  Skin:    General: Skin is warm and dry.     Capillary Refill: Capillary refill takes less than 2 seconds.     Findings: No erythema or lesion.  Neurological:     Mental Status: He is alert.     Sensory: No sensory deficit.     Motor: No weakness.     Comments: Patient has a rightward gaze preference.  Patient has symmetric strength and sensation in extremities.  Family reported generalized weakness but not focal initially.  Symmetric smile.  The patient would only say brief answers and per family can speak normally until going to bed last night.  Psychiatric:        Mood and Affect: Mood normal.     ED Results / Procedures / Treatments   Labs (all labs ordered are listed, but only abnormal results are displayed) Labs Reviewed  CBC -  Abnormal; Notable for the following components:      Result Value   Hemoglobin 10.8 (*)    HCT 35.4 (*)    MCV 79.4 (*)    MCH 24.2 (*)    All other components within normal limits  COMPREHENSIVE METABOLIC PANEL - Abnormal; Notable for the following components:   Glucose, Bld 142 (*)    Creatinine, Ser 1.68 (*)    GFR, Estimated 38 (*)    All other components within normal limits  I-STAT CHEM 8, ED - Abnormal; Notable for the following components:   Potassium 3.4 (*)    Creatinine, Ser 1.60 (*)    Glucose, Bld 135 (*)    Calcium, Ion  1.14 (*)    Hemoglobin 12.6 (*)    HCT 37.0 (*)    All other components within normal limits  PROTIME-INR  APTT  DIFFERENTIAL  ETHANOL  TSH  BRAIN NATRIURETIC PEPTIDE  URINALYSIS, ROUTINE W REFLEX MICROSCOPIC  CBG MONITORING, ED    EKG EKG Interpretation Date/Time:  Wednesday January 03 2023 13:06:54 EDT Ventricular Rate:  82 PR Interval:  190 QRS Duration:  175 QT Interval:  440 QTC Calculation: 514 R Axis:   -76  Text Interpretation: Sinus rhythm RBBB and LAFB Left ventricular hypertrophy when compared to priorm overall similar appearance. No STEMI Confirmed by Theda Belfast ([id number]) on 01/03/2023 1:47:47 PM  Radiology MR BRAIN WO CONTRAST  Result Date: 01/03/2023 CLINICAL DATA:  Abnormal speech, right gaze preference, weakness EXAM: MRI HEAD WITHOUT CONTRAST TECHNIQUE: Multiplanar, multiecho pulse sequences of the brain and surrounding structures were obtained without intravenous contrast. COMPARISON:  Brain MRI 04/09/2022 FINDINGS: Brain: There is no acute intracranial hemorrhage, extra-axial fluid collection, or acute infarct. There is moderate to advanced parenchymal volume loss with prominence of the ventricular system and extra-axial CSF spaces there is marked disproportionate bilateral hippocampal atrophy. Confluent FLAIR signal abnormality in the supratentorial white matter likely reflects moderate to advanced chronic small-vessel  ischemic change. The pituitary and suprasellar region are normal. There is no mass lesion. There is no mass effect or midline shift. Vascular: Normal flow voids. Skull and upper cervical spine: Normal marrow signal. Sinuses/Orbits: The paranasal sinuses are clear. Bilateral lens implants are in place. The globes and orbits are otherwise unremarkable. Other: The mastoid air cells and middle ear cavities are clear. IMPRESSION: No acute intracranial pathology. Electronically Signed   By: Lesia Hausen M.D.   On: 01/03/2023 16:10   DG Chest Portable 1 View  Result Date: 01/03/2023 CLINICAL DATA:  Provided history: Fall. Slurred speech. Right-sided weakness. EXAM: PORTABLE CHEST 1 VIEW COMPARISON:  Prior chest radiographs 11/10/2022 and earlier. FINDINGS: Cardiomegaly. No appreciable airspace consolidation or pulmonary edema. No evidence of pleural effusion or pneumothorax. No acute osseous abnormality identified. Degenerative changes of the spine. IMPRESSION: 1. No evidence of an acute cardiopulmonary abnormality. 2. Cardiomegaly. Electronically Signed   By: Jackey Loge D.O.   On: 01/03/2023 15:32    Procedures Procedures    Medications Ordered in ED Medications  sodium chloride flush (NS) 0.9 % injection 3 mL (has no administration in time range)    ED Course/ Medical Decision Making/ A&P                                 Medical Decision Making Amount and/or Complexity of Data Reviewed Labs: ordered. Radiology: ordered.    [name]. is a 89 y.o. male with a past medical history significant for hypertension, diabetes, dementia, blindness, CHF, and previous stroke who presents for neurologic changes.  According to family, about 8 PM last night was on patient went to bed and reportedly could speak normally at that point.  She says that later on in the evening patient had a fall and was on the ground.  Was complaining of some mild headache but there is no reported nausea or vomiting.   Patient is able to get up on his own and get back to bed.  This morning, patient is speaking different with more garbled slurred speech and aphasia.  According to family there is no focal weakness but he has generalized weakness all over  and could not get up like normal.  Patient reportedly denies consciousness.  He has not had any nausea or vomiting and denied other preceding symptoms such as fevers, chills, congestion, cough constipation, diarrhea, or urinary changes.  Family did say that recently had more edema in his legs and he has a history of CHF that they are worried about.  No reported chest pain or shortness breath however.  No evidence of traumatic injuries reported.  Exam, lungs are clear.  Chest nontender.  Abdomen nontender.  Patient had symmetric strength in arms and legs but was generalized weak compared to baseline per family.  No focality.  Symmetric smile.  Speech was garbled but he did say yes and no to some answers.  Pupils were symmetric but he did have a right gaze preference had a normal tone with the left.  Otherwise difficult to make follow commands but did move all extremities.   Given his history of stroke, will get imaging of his head and neck and will get CTA as well as a normal CT.  Will order MRI as well.  Concerned about possible stroke.  Given this fall and possible head injury and using Plavix will also look for abnormalities on the CT head.  He did report some mild headache.  Given his lack of focal/localizing weakness on my exam and only some garbled speech currently, do not feel he will be a code stroke at this time.  Will get the CT imaging and may touch base with neurology after imaging is completed.  Anticipate reassessment after workup to determine disposition.       4:20 PM MRI does not show acute stroke.  Given the gaze preference and speech difficulty, seizure also considered.  Will page neurology while awaiting for CT imaging results and reassessment.  Care  transferred to oncoming team to await reassessment and disposition.          Final Clinical Impression(s) / ED Diagnoses Final diagnoses:  Speech problem   Clinical Impression: 1. Speech problem     Disposition: Care transferred to oncoming team to await reassessment and disposition.  This note was prepared with assistance of Conservation officer, historic buildings. Occasional wrong-word or sound-a-like substitutions may have occurred due to the inherent limitations of voice recognition software.     Anquan Azzarello, Canary Brim, MD 01/03/23 [phone]

## 2023-01-03 NOTE — ED Notes (Signed)
ED TO INPATIENT HANDOFF REPORT  ED Nurse Name and Phone #: Joneen Boers, Paramedic / [phone]  S Name/Age/Gender [name]. 89 y.o. male Room/Bed: 011C/011C  Code Status   Code Status: Limited: Do not attempt resuscitation (DNR) -DNR-LIMITED -Do Not Intubate/DNI   Home/SNF/Other Home Patient oriented to: self and place Is this baseline? Yes   Triage Complete: Triage complete  Chief Complaint Acute encephalopathy [G93.40]  Triage Note Pts wife states pt went to bed at 2000 last night. When pt woke at 0900 he had slurred speech and right side weakness. No facial droop noted.    Allergies Allergies  Allergen Reactions   Penicillins Other (See Comments)   Lisinopril Cough   Penicillin G Hives and Rash    Level of Care/Admitting Diagnosis ED Disposition     ED Disposition  Admit   Condition  --   Comment  Hospital Area: [facility] [[id number]]  Level of Care: Telemetry Medical [104]  May place patient in observation at [facility] or [facility] if equivalent level of care is available:: No  Covid Evaluation: Asymptomatic - no recent exposure (last 10 days) testing not required  Diagnosis: Acute encephalopathy [829562]  Admitting Physician: Briscoe Deutscher [[id number]]  Attending Physician: Briscoe Deutscher [[id number]]          B Medical/Surgery History Past Medical History:  Diagnosis Date   Blindness    Dementia (HCC)    Diabetes mellitus without complication (HCC)    Glaucoma    Hypertension    Past Surgical History:  Procedure Laterality Date   LEFT HEART CATHETERIZATION WITH CORONARY ANGIOGRAM N/A 06/22/2014   Procedure: LEFT HEART CATHETERIZATION WITH CORONARY ANGIOGRAM;  Surgeon: Yates Decamp, MD;  Location: [facility] CATH LAB;  Service: Cardiovascular;  Laterality: N/A;     A IV Location/Drains/Wounds Patient Lines/Drains/Airways Status     Active Line/Drains/Airways     Name Placement date Placement time Site Days   Peripheral  IV 01/03/23 22 G 1.75" Anterior;Left Forearm 01/03/23  1512  Forearm  less than 1   Peripheral IV 01/03/23 20 G 1" Anterior;Distal;Right Forearm 01/03/23  --  Forearm  less than 1   External Urinary Catheter 01/03/23  1948  --  less than 1            Intake/Output Last 24 hours No intake or output data in the 24 hours ending 01/03/23 2136  Labs/Imaging Results for orders placed or performed during the hospital encounter of 01/03/23 (from the past 48 hour(s))  Protime-INR     Status: None   Collection Time: 01/03/23 12:21 PM  Result Value Ref Range   Prothrombin Time 13.5 11.4 - 15.2 seconds   INR 1.0 0.8 - 1.2    Comment: (NOTE) INR goal varies based on device and disease states. Performed at [facility], [address], [place], [place] [address]   APTT     Status: None   Collection Time: 01/03/23 12:21 PM  Result Value Ref Range   aPTT 28 24 - 36 seconds    Comment: Performed at [facility], [address] [place], [place] [address]  CBC     Status: Abnormal   Collection Time: 01/03/23 12:21 PM  Result Value Ref Range   WBC 7.4 4.0 - 10.5 K/uL   RBC 4.46 4.22 - 5.81 MIL/uL   Hemoglobin 10.8 (L) 13.0 - 17.0 g/dL   HCT 10.2 (L) 72.5 - 36.6 %   MCV  79.4 (L) 80.0 - 100.0 fL   MCH 24.2 (L) 26.0 - 34.0 pg   MCHC 30.5 30.0 - 36.0 g/dL   RDW 21.3 08.6 - 57.8 %   Platelets 217 150 - 400 K/uL   nRBC 0.0 0.0 - 0.2 %    Comment: Performed at [facility], [address], [place], [place] [address]  Differential     Status: None   Collection Time: 01/03/23 12:21 PM  Result Value Ref Range   Neutrophils Relative % 48 %   Neutro Abs 3.6 1.7 - 7.7 K/uL   Lymphocytes Relative 37 %   Lymphs Abs 2.8 0.7 - 4.0 K/uL   Monocytes Relative 8 %   Monocytes Absolute 0.6 0.1 - 1.0 K/uL   Eosinophils Relative 6 %   Eosinophils Absolute 0.4 0.0 - 0.5 K/uL   Basophils Relative 1 %   Basophils Absolute 0.1 0.0 - 0.1 K/uL   Immature Granulocytes 0 %   Abs Immature  Granulocytes 0.01 0.00 - 0.07 K/uL    Comment: Performed at [facility], [address] [place], [place] [address]  Comprehensive metabolic panel     Status: Abnormal   Collection Time: 01/03/23 12:21 PM  Result Value Ref Range   Sodium 140 135 - 145 mmol/L   Potassium 3.5 3.5 - 5.1 mmol/L   Chloride 103 98 - 111 mmol/L   CO2 23 22 - 32 mmol/L   Glucose, Bld 142 (H) 70 - 99 mg/dL    Comment: Glucose reference range applies only to samples taken after fasting for at least 8 hours.   BUN 18 8 - 23 mg/dL   Creatinine, Ser 1.32 (H) 0.61 - 1.24 mg/dL   Calcium 9.4 8.9 - 44.0 mg/dL   Total Protein 7.0 6.5 - 8.1 g/dL   Albumin 3.9 3.5 - 5.0 g/dL   AST 30 15 - 41 U/L   ALT 17 0 - 44 U/L   Alkaline Phosphatase 61 38 - 126 U/L   Total Bilirubin 0.4 0.3 - 1.2 mg/dL   GFR, Estimated 38 (L) >60 mL/min    Comment: (NOTE) Calculated using the CKD-EPI Creatinine Equation (2021)    Anion gap 14 5 - 15    Comment: Performed at [facility], [address], [place], [place] [address]  Ethanol     Status: None   Collection Time: 01/03/23 12:21 PM  Result Value Ref Range   Alcohol, Ethyl (B) <10 <10 mg/dL    Comment: (NOTE) Lowest detectable limit for serum alcohol is 10 mg/dL.  For medical purposes only. Performed at [facility], [address], [place], [place] [address]   I-stat chem 8, ED     Status: Abnormal   Collection Time: 01/03/23 12:36 PM  Result Value Ref Range   Sodium 141 135 - 145 mmol/L   Potassium 3.4 (L) 3.5 - 5.1 mmol/L   Chloride 104 98 - 111 mmol/L   BUN 19 8 - 23 mg/dL   Creatinine, Ser 4.03 (H) 0.61 - 1.24 mg/dL   Glucose, Bld 474 (H) 70 - 99 mg/dL    Comment: Glucose reference range applies only to samples taken after fasting for at least 8 hours.   Calcium, Ion 1.14 (L) 1.15 - 1.40 mmol/L   TCO2 24 22 - 32 mmol/L   Hemoglobin 12.6 (L) 13.0 - 17.0 g/dL   HCT 25.9 (L) 56.3 - 87.5 %  TSH     Status: None  Collection Time: 01/03/23  3:04 PM   Result Value Ref Range   TSH 4.155 0.350 - 4.500 uIU/mL    Comment: Performed by a 3rd Generation assay with a functional sensitivity of <=0.01 uIU/mL. Performed at [facility], [address], [place], [place] [address]   Brain natriuretic peptide     Status: None   Collection Time: 01/03/23  3:04 PM  Result Value Ref Range   B Natriuretic Peptide 53.9 0.0 - 100.0 pg/mL    Comment: Performed at [facility], [address] [place], [place] [address]  CBG monitoring, ED     Status: Abnormal   Collection Time: 01/03/23  5:19 PM  Result Value Ref Range   Glucose-Capillary 117 (H) 70 - 99 mg/dL    Comment: Glucose reference range applies only to samples taken after fasting for at least 8 hours.   CT ANGIO HEAD NECK W WO CM  Result Date: 01/03/2023 CLINICAL DATA:  Neuro deficit, acute, stroke suspected Previous stroke on aspirin and Plavix, speech abnormality today, diffusely weak per family. Also fall overnight. Rule out stroke versus LVO versus traumatic injuries EXAM: CT ANGIOGRAPHY HEAD AND NECK WITH AND WITHOUT CONTRAST TECHNIQUE: Multidetector CT imaging of the head and neck was performed using the standard protocol during bolus administration of intravenous contrast. Multiplanar CT image reconstructions and MIPs were obtained to evaluate the vascular anatomy. Carotid stenosis measurements (when applicable) are obtained utilizing NASCET criteria, using the distal internal carotid diameter as the denominator. RADIATION DOSE REDUCTION: This exam was performed according to the departmental dose-optimization program which includes automated exposure control, adjustment of the mA and/or kV according to patient size and/or use of iterative reconstruction technique. CONTRAST:  60mL OMNIPAQUE IOHEXOL 350 MG/ML SOLN COMPARISON:  None Available. FINDINGS: CT HEAD FINDINGS Brain: No evidence of acute infarction, hemorrhage, hydrocephalus, extra-axial collection or mass lesion/mass effect.  Vascular: See below. Skull: No acute fracture. Sinuses/Orbits: Clear sinuses.  No acute orbital findings. Other: No mastoid effusions. Review of the MIP images confirms the above findings CTA NECK FINDINGS Aortic arch: Great vessel origins are patent without significant stenosis. Atherosclerosis. Right carotid system: No evidence of dissection, stenosis (50% or greater), or occlusion. Left carotid system: No evidence of dissection, stenosis (50% or greater), or occlusion. Vertebral arteries: Right dominant. Patent. Severe stenosis of the right V2 vertebral artery at the C5-C6 level. Severe left vertebral artery origin stenosis. Skeleton: No acute abnormality on limited assessment. Other neck: No acute abnormality on limited assessment. Upper chest: Visualized lung apices are clear. Review of the MIP images confirms the above findings CTA HEAD FINDINGS Anterior circulation: Bilateral intracranial ICAs, MCAs, and ACAs are patent without proximal high-grade stenosis. Posterior circulation: Bilateral intradural vertebral arteries, basilar artery and bilateral posterior cerebral arteries are patent. Moderate right P2 PCA stenosis. Venous sinuses: As permitted by contrast timing, patent. Review of the MIP images confirms the above findings IMPRESSION: 1. No emergent large vessel occlusion. 2. Severe stenosis of the right V2 vertebral artery at the C5-C6. 3. Severe stenosis of the nondominant left vertebral artery origin. 4. Moderate right P2 PCA stenosis.  No emergent large Electronically Signed   By: Feliberto Harts M.D.   On: 01/03/2023 17:23   MR BRAIN WO CONTRAST  Result Date: 01/03/2023 CLINICAL DATA:  Abnormal speech, right gaze preference, weakness EXAM: MRI HEAD WITHOUT CONTRAST TECHNIQUE: Multiplanar, multiecho pulse sequences of the brain and surrounding structures were obtained without intravenous contrast. COMPARISON:  Brain MRI 04/09/2022 FINDINGS:  Brain: There is no acute intracranial hemorrhage,  extra-axial fluid collection, or acute infarct. There is moderate to advanced parenchymal volume loss with prominence of the ventricular system and extra-axial CSF spaces there is marked disproportionate bilateral hippocampal atrophy. Confluent FLAIR signal abnormality in the supratentorial white matter likely reflects moderate to advanced chronic small-vessel ischemic change. The pituitary and suprasellar region are normal. There is no mass lesion. There is no mass effect or midline shift. Vascular: Normal flow voids. Skull and upper cervical spine: Normal marrow signal. Sinuses/Orbits: The paranasal sinuses are clear. Bilateral lens implants are in place. The globes and orbits are otherwise unremarkable. Other: The mastoid air cells and middle ear cavities are clear. IMPRESSION: No acute intracranial pathology. Electronically Signed   By: Lesia Hausen M.D.   On: 01/03/2023 16:10   DG Chest Portable 1 View  Result Date: 01/03/2023 CLINICAL DATA:  Provided history: Fall. Slurred speech. Right-sided weakness. EXAM: PORTABLE CHEST 1 VIEW COMPARISON:  Prior chest radiographs 11/10/2022 and earlier. FINDINGS: Cardiomegaly. No appreciable airspace consolidation or pulmonary edema. No evidence of pleural effusion or pneumothorax. No acute osseous abnormality identified. Degenerative changes of the spine. IMPRESSION: 1. No evidence of an acute cardiopulmonary abnormality. 2. Cardiomegaly. Electronically Signed   By: Jackey Loge D.O.   On: 01/03/2023 15:32    Pending Labs Unresulted Labs (From admission, onward)     Start     Ordered   01/04/23 0500  CBC  Tomorrow morning,   R       Question:  Specimen collection method  Answer:  IV Team=IV Team collect   01/03/23 2133   01/04/23 0500  Basic metabolic panel  Tomorrow morning,   R       Question:  Specimen collection method  Answer:  IV Team=IV Team collect   01/03/23 2133   01/03/23 2134  Osmolality  Once,   R       Question:  Specimen collection method   Answer:  IV Team=IV Team collect   01/03/23 2133   01/03/23 2134  Vitamin B12  Once,   R       Question:  Specimen collection method  Answer:  IV Team=IV Team collect   01/03/23 2133   01/03/23 2134  Ammonia  Once,   R       Question:  Specimen collection method  Answer:  IV Team=IV Team collect   01/03/23 2133   01/03/23 2134  Cortisol  Once,   R       Question:  Specimen collection method  Answer:  IV Team=IV Team collect   01/03/23 2133   01/03/23 2134  Blood gas, venous  Once,   R       Question:  Specimen collection method  Answer:  IV Team=IV Team collect   01/03/23 2133   01/03/23 2133  Magnesium  Once,   R       Question:  Specimen collection method  Answer:  IV Team=IV Team collect   01/03/23 2133   01/03/23 2133  Phosphorus  Once,   R       Question:  Specimen collection method  Answer:  IV Team=IV Team collect   01/03/23 2133   01/03/23 1228  Urinalysis, Routine w reflex microscopic -Urine, Clean Catch  Once,   URGENT       Question:  Specimen Source  Answer:  Urine, Clean Catch   01/03/23 1227            Vitals/Pain Today's Vitals  01/03/23 1650 01/03/23 1700 01/03/23 1730 01/03/23 2013  BP: (!) 144/75 (!) 141/76 (!) 137/123 (!) 156/87  Pulse: 80   79  Resp: 16  (!) 22 12  Temp: (!) 97.5 F (36.4 C)   97.6 F (36.4 C)  TempSrc: Tympanic   Oral  SpO2: 100%   100%  Weight:      Height:      PainSc: Asleep       Isolation Precautions No active isolations  Medications Medications  sodium chloride flush (NS) 0.9 % injection 3 mL (has no administration in time range)  LORazepam (ATIVAN) injection 2 mg (has no administration in time range)  LORazepam (ATIVAN) injection 2 mg (has no administration in time range)  furosemide (LASIX) tablet 40 mg (has no administration in time range)  hydrALAZINE (APRESOLINE) tablet 10 mg (has no administration in time range)  isosorbide mononitrate (IMDUR) 24 hr tablet 30 mg (has no administration in time range)   rosuvastatin (CRESTOR) tablet 20 mg (has no administration in time range)  citalopram (CELEXA) tablet 10 mg (has no administration in time range)  pantoprazole (PROTONIX) EC tablet 40 mg (has no administration in time range)  clopidogrel (PLAVIX) tablet 75 mg (has no administration in time range)  ipratropium-albuterol (DUONEB) 0.5-2.5 (3) MG/3ML nebulizer solution 3 mL (has no administration in time range)  mometasone-formoterol (DULERA) 100-5 MCG/ACT inhaler 2 puff (has no administration in time range)  insulin aspart (novoLOG) injection 0-6 Units (has no administration in time range)  insulin aspart (novoLOG) injection 0-5 Units (has no administration in time range)  heparin injection 5,000 Units (has no administration in time range)  sodium chloride flush (NS) 0.9 % injection 3 mL (has no administration in time range)  acetaminophen (TYLENOL) tablet 650 mg (has no administration in time range)    Or  acetaminophen (TYLENOL) suppository 650 mg (has no administration in time range)  senna-docusate (Senokot-S) tablet 1 tablet (has no administration in time range)  iohexol (OMNIPAQUE) 350 MG/ML injection 60 mL (60 mLs Intravenous Contrast Given 01/03/23 1653)    Mobility This writer has not seen patient walk and PT's wife at bedside gives multiple answers. Sometimes PT walks with or without assistance and sometimes PT walks with assisted device/holds onto walls.    Focused Assessments Neuro Assessment Handoff:  Swallow screen pass?  PT has not been fully awake for swallow screen to be administered.   NIH Stroke Scale  Dizziness Present: No Headache Present: No Interval: Shift assessment Level of Consciousness (1a.)   : Alert, keenly responsive LOC Questions (1b. )   : Answers one question correctly (PT has HX of dementia) LOC Commands (1c. )   : Performs both tasks correctly Best Gaze (2. )  : Partial gaze palsy (PT is partially blind) Visual (3. )  : Partial hemianopia (PT is  partially blind) Facial Palsy (4. )    : Normal symmetrical movements Motor Arm, Left (5a. )   : No drift Motor Arm, Right (5b. ) : No drift Motor Leg, Left (6a. )  : No drift Motor Leg, Right (6b. ) : No drift Limb Ataxia (7. ): Absent Sensory (8. )  : Normal, no sensory loss Best Language (9. )  : No aphasia Dysarthria (10. ): Normal Extinction/Inattention (11.)   : No Abnormality Complete NIHSS TOTAL: 3     Neuro Assessment: Exceptions to WDL Neuro Checks:   Shift assessment (01/03/23 1230)  Has TPA been given? No If patient is a Neuro  Trauma and patient is going to OR before floor call report to 4N Charge nurse: [phone] or [phone]   R Recommendations: See Admitting Provider Note  Report given to:   Additional Notes:

## 2023-01-03 NOTE — Progress Notes (Signed)
LTM EEG running - no initial skin breakdown - push button tested - neuro notified.  

## 2023-01-03 NOTE — Progress Notes (Signed)
LTM EEG reviewed at the bedside. The patient is currently sleeping. Tracings are symmetric in all leads with intermittent low amplitude slow wave activity. No electrographic seizures are seen.   Electronically signed: Dr. Caryl Pina

## 2023-01-03 NOTE — Consult Note (Signed)
NEUROLOGY CONSULT NOTE   Date of service: January 03, 2023 Patient Name: Grant Jensen. MRN:  [id number] DOB:  1928-10-15 Chief Complaint: "None" Requesting Provider: Tegeler, Canary Brim, *  History of Present Illness  Grant Jensen. is a 89 y.o. male with history of blindness, dementia, diabetes, glaucoma and hypertension who presents with weakness, slurred speech and right gaze deviation which occurred after he woke up this morning.  Patient's wife, who is his caregiver at home, states that she woke him up at 8 AM as usual but did not want to get out of bed.  At 9:00, she wanted to get him up to give him breakfast because he is a diabetic, but he was unable to get out of bed even with assistance.  Speech was noted to be slurred.  Per wife, at baseline he is oriented to person and place only and requires assistance with all ADLs due to blindness and dementia.  On arrival to the ED, he was not speaking much and had some right gaze deviation, which is now resolved.  MRI brain was negative for acute abnormality.  She notes it is very unusual for him to be lethargic like this all day long and that normally he is much more participatory.  She did also note a right gaze preference this morning and notes that usually despite being blind he will look around and orient to the voice of whomever is speaking which he was not doing  ROS   Unable to ascertain due to altered mental status  Past History   Past Medical History:  Diagnosis Date   Blindness    Dementia (HCC)    Diabetes mellitus without complication (HCC)    Glaucoma    Hypertension     Past Surgical History:  Procedure Laterality Date   LEFT HEART CATHETERIZATION WITH CORONARY ANGIOGRAM N/A 06/22/2014   Procedure: LEFT HEART CATHETERIZATION WITH CORONARY ANGIOGRAM;  Surgeon: Yates Decamp, MD;  Location: [facility] CATH LAB;  Service: Cardiovascular;  Laterality: N/A;    Family History: History reviewed. No pertinent family  history.  Social History  reports that he has never smoked. He does not have any smokeless tobacco history on file. He reports that he does not currently use alcohol. He reports that he does not currently use drugs.  Allergies  Allergen Reactions   Penicillins Other (See Comments)   Lisinopril Cough   Penicillin G Hives and Rash    Medications   Current Facility-Administered Medications:    sodium chloride flush (NS) 0.9 % injection 3 mL, 3 mL, Intravenous, Once, Tegeler, Canary Brim, MD  Current Outpatient Medications:    acetaminophen (TYLENOL) 325 MG tablet, Take 2 tablets (650 mg total) by mouth every 4 (four) hours as needed for mild pain (or temp > 37.5 C (99.5 F))., Disp: , Rfl:    albuterol (VENTOLIN HFA) 108 (90 Base) MCG/ACT inhaler, Inhale 2 puffs into the lungs every 4 (four) hours as needed for wheezing or shortness of breath., Disp: , Rfl:    citalopram (CELEXA) 10 MG tablet, Take 10 mg by mouth daily., Disp: , Rfl:    clopidogrel (PLAVIX) 75 MG tablet, Take 1 tablet (75 mg total) by mouth daily., Disp: 30 tablet, Rfl: 0   furosemide (LASIX) 40 MG tablet, Take 1 tablet (40 mg total) by mouth daily., Disp: 30 tablet, Rfl: 0   glimepiride (AMARYL) 1 MG tablet, Take 1 tablet (1 mg total) by mouth every morning., Disp: 30 tablet, Rfl:  0   hydrALAZINE (APRESOLINE) 10 MG tablet, Take 1 tablet (10 mg total) by mouth in the morning and at bedtime., Disp: 60 tablet, Rfl: 0   ipratropium-albuterol (DUONEB) 0.5-2.5 (3) MG/3ML SOLN, Take 3 mLs by nebulization every 6 (six) hours as needed (SOB)., Disp: , Rfl:    isosorbide mononitrate (IMDUR) 30 MG 24 hr tablet, Take 1 tablet (30 mg total) by mouth daily., Disp: 30 tablet, Rfl: 0   metFORMIN (GLUCOPHAGE) 500 MG tablet, Take 1 tablet (500 mg total) by mouth daily with breakfast., Disp: 30 tablet, Rfl: 0   pantoprazole (PROTONIX) 40 MG tablet, Take 1 tablet (40 mg total) by mouth daily., Disp: 30 tablet, Rfl: 0   rosuvastatin (CRESTOR)  20 MG tablet, Take 1 tablet (20 mg total) by mouth daily., Disp: 30 tablet, Rfl: 0   SYMBICORT 80-4.5 MCG/ACT inhaler, Inhale 2 puffs into the lungs 2 (two) times daily., Disp: , Rfl:    donepezil (ARICEPT) 10 MG tablet, Take 1 tablet (10 mg total) by mouth at bedtime. (Patient not taking: Reported on 01/03/2023), Disp: 30 tablet, Rfl: 3   predniSONE (DELTASONE) 20 MG tablet, Take 20 mg by mouth 2 (two) times daily. (Patient not taking: Reported on 01/03/2023), Disp: , Rfl:   Vitals   Vitals:   01/03/23 1500 01/03/23 1650 01/03/23 1700 01/03/23 1730  BP: (!) 157/82 (!) 144/75 (!) 141/76 (!) 137/123  Pulse: 80 80    Resp: 16 16  (!) 22  Temp: 97.7 F (36.5 C) (!) 97.5 F (36.4 C)    TempSrc: Oral Tympanic    SpO2: 100% 100%    Weight:      Height:        Body mass index is 28.87 kg/m.  Physical Exam   Constitutional: Chronically ill-appearing elderly patient in no acute distress Psych: Affect appropriate to situation.  Eyes: No scleral injection.  HENT: No OP obstruction.  Head: Normocephalic.  Cardiovascular: Normal rate and regular rhythm.  Respiratory: Effort normal, non-labored breathing.  Skin: WDI.   Neurologic Examination    NEURO:  Mental Status: Drowsy but arouses to loud voice and touch, able to state name but not location or time Speech/Language: speech is with severe dysarthria.  Patient speaks in short phrases which are sometimes difficult to understand due to dysarthria  Cranial Nerves:  II: Blind at baseline III, IV, VI: EOMI. Eyelids elevate symmetrically.  VII: Smile is symmetrical.  VIII: hearing intact to voice. IX, X: Voice is quite dysarthric XII: tongue is midline without fasciculations. Motor: Able to move bilateral upper extremities to command with good antigravity strength, moves bilateral lower extremities to command but does not lift them off the bed Tone: is normal and bulk is normal Sensation- Intact to light touch bilaterally.   Coordination: Unable to perform Gait- deferred  On later attending examination, extremely sleepy.  Minimal verbal interaction except telling examiner to stop when blanket is removed for examination.  Using all 4 extremities grossly equally and purposefully to pull the blanket back over himself.  Not following commands.  Equally reactive to touch in all 4 extremities.  Irritable and does swing at examiner    Labs   CBC:  Recent Labs  Lab 01/03/23 1221 01/03/23 1236  WBC 7.4  --   NEUTROABS 3.6  --   HGB 10.8* 12.6*  HCT 35.4* 37.0*  MCV 79.4*  --   PLT 217  --     Basic Metabolic Panel:  Lab Results  Component Value  Date   NA 141 01/03/2023   K 3.4 (L) 01/03/2023   CO2 23 01/03/2023   GLUCOSE 135 (H) 01/03/2023   BUN 19 01/03/2023   CREATININE 1.60 (H) 01/03/2023   CALCIUM 9.4 01/03/2023   GFRNONAA 38 (L) 01/03/2023   GFRAA 64 (L) 05/08/2014   Lipid Panel:  Lab Results  Component Value Date   LDLCALC 90 04/10/2022   HgbA1c:  Lab Results  Component Value Date   HGBA1C 8.6 (H) 11/11/2022   Urine Drug Screen: No results found for: "LABOPIA", "COCAINSCRNUR", "LABBENZ", "AMPHETMU", "THCU", "LABBARB"   Alcohol Level     Component Value Date/Time   ETH <10 01/03/2023 1221   INR  Lab Results  Component Value Date   INR 1.0 01/03/2023   APTT  Lab Results  Component Value Date   APTT 28 01/03/2023   AED levels: No results found for: "PHENYTOIN", "ZONISAMIDE", "LAMOTRIGINE", "LEVETIRACETA"   CT Head without contrast(Personally reviewed): No acute abnormality  CT angio Head and Neck with contrast(Personally reviewed): No emergent LVO, severe stenosis of right P2 at C5-C6, severe stenosis of nondominant vertebral large artery origin and moderate right P2 PCA stenosis  MRI Brain(Personally reviewed): No acute abnormality   EEG:  Pending   Impression   Grant Jensen. is a 89 y.o. male with history of dementia, blindness, hypertension and diabetes  who presents with weakness and slurred speech which began when he awoke this morning.  Per his wife, he was normal yesterday when he went to bed but initially did not want to get out of bed in the morning and then was unable to get up even with assistance.  His wife called EMS and had him brought to the ED, where he was noted to have a right gaze preference.  This has since resolved.  Patient was speaking to his wife earlier this morning and was able to speak when examined, but voice was very dysarthric.  She agrees that he did have a period where he was looking off to the right which is unusual for him.  He was oriented to person only when seen in the ED and is normally oriented to person and place.  Suspect that the etiology of his presentation is toxic metabolic encephalopathy or behavioral change in the setting of dementia.  However, he is afebrile and does not have a leukocytosis.  Urinalysis is pending.  Seizure activity was a concern given gaze deviation, but this has resolved.  Patient is at high risk for seizures due to dementia, and his prolonged lethargy today as well as gaze deviation noted by wife.  Will therefore obtain urgent EEG with long-term EEG monitoring to confirm he is not having seizures  Recommendations  -STAT EEG with long-term EEG monitoring -Workup for toxic metabolic encephalopathy per primary team -Seizure precautions -Ativan 2 mg as needed for seizure activity lasting greater than 5 minutes every 4 hours, for 2 doses.  Notify neurology if used -Neurology will follow along in consultation  ______________________________________________________________________  Patient seen by NP and then by MD, MD to edit note as needed  Signed,  Cortney E Ernestina Columbia , MSN, AGACNP-BC [facility] See Amion for schedule and pager information 01/03/2023 6:45 PM  Attending Neurologist's note:  I personally saw this patient, gathering history, performing a full neurologic  examination, reviewing relevant labs, personally reviewing relevant imaging including MRI brain, CTA head and neck, and formulated the assessment and plan, adding the note above for completeness and clarity  to accurately reflect my thoughts  Brooke Dare MD-PhD [facility] [phone] Available 7 AM to 7 PM, outside these hours please contact Neurologist on call listed on AMION

## 2023-01-03 NOTE — ED Notes (Signed)
This RN attempted in and out cath, met extreme resistance. In and out was unsuccessful

## 2023-01-03 NOTE — ED Notes (Signed)
Pt unable to answer admission questions. 

## 2023-01-03 NOTE — ED Triage Notes (Signed)
Pts wife states pt went to bed at 2000 last night. When pt woke at 0900 he had slurred speech and right side weakness. No facial droop noted.

## 2023-01-03 NOTE — H&P (Signed)
History and Physical    Grant Jensen. AOZ:[id number] DOB: 30-Mar-1928 DOA: 01/03/2023  PCP: Thana Ates, MD   Patient coming from: Home   Chief Complaint: Slurred speech, weakness, gaze deviation   HPI: Grant Jensen. is a 89 y.o. male with medical history significant for hypertension, type 2 diabetes mellitus, blindness, dementia, and history of CVA who presents with slurred speech, weakness, and gaze deviation.  Patient reportedly went to bed in his usual state at approximately 8 PM last night but was noted to have slurred speech, weakness, and gaze deviation upon waking this morning.    ED Course: Upon arrival to the ED, patient is found to be afebrile and saturating well on room air with normal heart rate and stable blood pressure.  Labs are most notable for creatinine 1.68 and hemoglobin 10.8.  No acute findings are noted on MRI brain or chest x-ray.  CTA is negative for emergent large vessel occlusion.  Neurology was consulted by the ED physician and recommended medical admission for further evaluation including EEG.  Review of Systems:  ROS limited by patient's clinical condition.  Past Medical History:  Diagnosis Date   Blindness    Dementia (HCC)    Diabetes mellitus without complication (HCC)    Glaucoma    Hypertension     Past Surgical History:  Procedure Laterality Date   LEFT HEART CATHETERIZATION WITH CORONARY ANGIOGRAM N/A 06/22/2014   Procedure: LEFT HEART CATHETERIZATION WITH CORONARY ANGIOGRAM;  Surgeon: Yates Decamp, MD;  Location: [facility] CATH LAB;  Service: Cardiovascular;  Laterality: N/A;    Social History:   reports that he has never smoked. He does not have any smokeless tobacco history on file. He reports that he does not currently use alcohol. He reports that he does not currently use drugs.  Allergies  Allergen Reactions   Penicillins Other (See Comments)   Lisinopril Cough   Penicillin G Hives and Rash    History reviewed. No pertinent  family history.   Prior to Admission medications   Medication Sig Start Date End Date Taking? Authorizing Provider  acetaminophen (TYLENOL) 325 MG tablet Take 2 tablets (650 mg total) by mouth every 4 (four) hours as needed for mild pain (or temp > 37.5 C (99.5 F)). 04/18/22  Yes Angiulli, Mcarthur Rossetti, PA-C  albuterol (VENTOLIN HFA) 108 (90 Base) MCG/ACT inhaler Inhale 2 puffs into the lungs every 4 (four) hours as needed for wheezing or shortness of breath. 12/06/21  Yes [provider]  citalopram (CELEXA) 10 MG tablet Take 10 mg by mouth daily. 10/31/22  Yes [provider]  clopidogrel (PLAVIX) 75 MG tablet Take 1 tablet (75 mg total) by mouth daily. 04/18/22  Yes Angiulli, Mcarthur Rossetti, PA-C  furosemide (LASIX) 40 MG tablet Take 1 tablet (40 mg total) by mouth daily. 11/14/22 01/03/23 Yes Arrien, York Ram, MD  glimepiride (AMARYL) 1 MG tablet Take 1 tablet (1 mg total) by mouth every morning. 04/18/22  Yes Angiulli, Mcarthur Rossetti, PA-C  hydrALAZINE (APRESOLINE) 10 MG tablet Take 1 tablet (10 mg total) by mouth in the morning and at bedtime. 11/14/22 01/03/23 Yes Arrien, York Ram, MD  ipratropium-albuterol (DUONEB) 0.5-2.5 (3) MG/3ML SOLN Take 3 mLs by nebulization every 6 (six) hours as needed (SOB). 12/09/21  Yes [provider]  isosorbide mononitrate (IMDUR) 30 MG 24 hr tablet Take 1 tablet (30 mg total) by mouth daily. 04/19/22  Yes Angiulli, Mcarthur Rossetti, PA-C  metFORMIN (GLUCOPHAGE) 500 MG tablet Take  1 tablet (500 mg total) by mouth daily with breakfast. 04/18/22  Yes Angiulli, Mcarthur Rossetti, PA-C  pantoprazole (PROTONIX) 40 MG tablet Take 1 tablet (40 mg total) by mouth daily. 04/18/22 04/18/23 Yes Angiulli, Mcarthur Rossetti, PA-C  rosuvastatin (CRESTOR) 20 MG tablet Take 1 tablet (20 mg total) by mouth daily. 04/18/22  Yes Angiulli, Mcarthur Rossetti, PA-C  SYMBICORT 80-4.5 MCG/ACT inhaler Inhale 2 puffs into the lungs 2 (two) times daily. 11/08/22  Yes [provider]    Physical  Exam: Vitals:   01/03/23 1650 01/03/23 1700 01/03/23 1730 01/03/23 2013  BP: (!) 144/75 (!) 141/76 (!) 137/123 (!) 156/87  Pulse: 80   79  Resp: 16  (!) 22 12  Temp: (!) 97.5 F (36.4 C)   97.6 F (36.4 C)  TempSrc: Tympanic   Oral  SpO2: 100%   100%  Weight:      Height:         Constitutional: NAD, calm  Eyes: PERTLA, lids and conjunctivae normal ENMT: Mucous membranes are moist. Posterior pharynx clear of any exudate or lesions.   Neck: supple, no masses  Respiratory: no wheezing, no crackles. No accessory muscle use.  Cardiovascular: S1 & S2 heard, regular rate and rhythm. No JVD. Abdomen: No distension, no tenderness, soft. Bowel sounds active.  Musculoskeletal: no clubbing / cyanosis. No joint deformity upper and lower extremities.   Skin: no significant rashes, lesions, ulcers. Warm, dry, well-perfused. Neurologic: CN 2-12 grossly intact. Moving all extremities. Sleeping. Wakes to voice but not answering questions.    Labs and Imaging on Admission: I have personally reviewed following labs and imaging studies  CBC: Recent Labs  Lab 01/03/23 1221 01/03/23 1236  WBC 7.4  --   NEUTROABS 3.6  --   HGB 10.8* 12.6*  HCT 35.4* 37.0*  MCV 79.4*  --   PLT 217  --    Basic Metabolic Panel: Recent Labs  Lab 01/03/23 1221 01/03/23 1236  NA 140 141  K 3.5 3.4*  CL 103 104  CO2 23  --   GLUCOSE 142* 135*  BUN 18 19  CREATININE 1.68* 1.60*  CALCIUM 9.4  --    GFR: Estimated Creatinine Clearance: 26.5 mL/min (A) (by C-G formula based on SCr of 1.6 mg/dL (H)). Liver Function Tests: Recent Labs  Lab 01/03/23 1221  AST 30  ALT 17  ALKPHOS 61  BILITOT 0.4  PROT 7.0  ALBUMIN 3.9   No results for input(s): "LIPASE", "AMYLASE" in the last 168 hours. No results for input(s): "AMMONIA" in the last 168 hours. Coagulation Profile: Recent Labs  Lab 01/03/23 1221  INR 1.0   Cardiac Enzymes: No results for input(s): "CKTOTAL", "CKMB", "CKMBINDEX", "TROPONINI" in  the last 168 hours. BNP (last 3 results) No results for input(s): "PROBNP" in the last 8760 hours. HbA1C: No results for input(s): "HGBA1C" in the last 72 hours. CBG: Recent Labs  Lab 01/03/23 1719  GLUCAP 117*   Lipid Profile: No results for input(s): "CHOL", "HDL", "LDLCALC", "TRIG", "CHOLHDL", "LDLDIRECT" in the last 72 hours. Thyroid Function Tests: Recent Labs    01/03/23 1504  TSH 4.155   Anemia Panel: No results for input(s): "VITAMINB12", "FOLATE", "FERRITIN", "TIBC", "IRON", "RETICCTPCT" in the last 72 hours. Urine analysis:    Component Value Date/Time   COLORURINE STRAW (A) 04/30/2021 0346   APPEARANCEUR CLEAR 04/30/2021 0346   LABSPEC 1.010 04/30/2021 0346   PHURINE 7.0 04/30/2021 0346   GLUCOSEU NEGATIVE 04/30/2021 0346   HGBUR NEGATIVE 04/30/2021 0346  BILIRUBINUR NEGATIVE 04/30/2021 0346   KETONESUR NEGATIVE 04/30/2021 0346   PROTEINUR NEGATIVE 04/30/2021 0346   NITRITE NEGATIVE 04/30/2021 0346   LEUKOCYTESUR NEGATIVE 04/30/2021 0346   Sepsis Labs: @LABRCNTIP (procalcitonin:4,lacticidven:4) )No results found for this or any previous visit (from the past 240 hour(s)).   Radiological Exams on Admission: CT ANGIO HEAD NECK W WO CM  Result Date: 01/03/2023 CLINICAL DATA:  Neuro deficit, acute, stroke suspected Previous stroke on aspirin and Plavix, speech abnormality today, diffusely weak per family. Also fall overnight. Rule out stroke versus LVO versus traumatic injuries EXAM: CT ANGIOGRAPHY HEAD AND NECK WITH AND WITHOUT CONTRAST TECHNIQUE: Multidetector CT imaging of the head and neck was performed using the standard protocol during bolus administration of intravenous contrast. Multiplanar CT image reconstructions and MIPs were obtained to evaluate the vascular anatomy. Carotid stenosis measurements (when applicable) are obtained utilizing NASCET criteria, using the distal internal carotid diameter as the denominator. RADIATION DOSE REDUCTION: This exam was  performed according to the departmental dose-optimization program which includes automated exposure control, adjustment of the mA and/or kV according to patient size and/or use of iterative reconstruction technique. CONTRAST:  60mL OMNIPAQUE IOHEXOL 350 MG/ML SOLN COMPARISON:  None Available. FINDINGS: CT HEAD FINDINGS Brain: No evidence of acute infarction, hemorrhage, hydrocephalus, extra-axial collection or mass lesion/mass effect. Vascular: See below. Skull: No acute fracture. Sinuses/Orbits: Clear sinuses.  No acute orbital findings. Other: No mastoid effusions. Review of the MIP images confirms the above findings CTA NECK FINDINGS Aortic arch: Great vessel origins are patent without significant stenosis. Atherosclerosis. Right carotid system: No evidence of dissection, stenosis (50% or greater), or occlusion. Left carotid system: No evidence of dissection, stenosis (50% or greater), or occlusion. Vertebral arteries: Right dominant. Patent. Severe stenosis of the right V2 vertebral artery at the C5-C6 level. Severe left vertebral artery origin stenosis. Skeleton: No acute abnormality on limited assessment. Other neck: No acute abnormality on limited assessment. Upper chest: Visualized lung apices are clear. Review of the MIP images confirms the above findings CTA HEAD FINDINGS Anterior circulation: Bilateral intracranial ICAs, MCAs, and ACAs are patent without proximal high-grade stenosis. Posterior circulation: Bilateral intradural vertebral arteries, basilar artery and bilateral posterior cerebral arteries are patent. Moderate right P2 PCA stenosis. Venous sinuses: As permitted by contrast timing, patent. Review of the MIP images confirms the above findings IMPRESSION: 1. No emergent large vessel occlusion. 2. Severe stenosis of the right V2 vertebral artery at the C5-C6. 3. Severe stenosis of the nondominant left vertebral artery origin. 4. Moderate right P2 PCA stenosis.  No emergent large Electronically  Signed   By: Feliberto Harts M.D.   On: 01/03/2023 17:23   MR BRAIN WO CONTRAST  Result Date: 01/03/2023 CLINICAL DATA:  Abnormal speech, right gaze preference, weakness EXAM: MRI HEAD WITHOUT CONTRAST TECHNIQUE: Multiplanar, multiecho pulse sequences of the brain and surrounding structures were obtained without intravenous contrast. COMPARISON:  Brain MRI 04/09/2022 FINDINGS: Brain: There is no acute intracranial hemorrhage, extra-axial fluid collection, or acute infarct. There is moderate to advanced parenchymal volume loss with prominence of the ventricular system and extra-axial CSF spaces there is marked disproportionate bilateral hippocampal atrophy. Confluent FLAIR signal abnormality in the supratentorial white matter likely reflects moderate to advanced chronic small-vessel ischemic change. The pituitary and suprasellar region are normal. There is no mass lesion. There is no mass effect or midline shift. Vascular: Normal flow voids. Skull and upper cervical spine: Normal marrow signal. Sinuses/Orbits: The paranasal sinuses are clear. Bilateral lens implants are in place.  The globes and orbits are otherwise unremarkable. Other: The mastoid air cells and middle ear cavities are clear. IMPRESSION: No acute intracranial pathology. Electronically Signed   By: Lesia Hausen M.D.   On: 01/03/2023 16:10   DG Chest Portable 1 View  Result Date: 01/03/2023 CLINICAL DATA:  Provided history: Fall. Slurred speech. Right-sided weakness. EXAM: PORTABLE CHEST 1 VIEW COMPARISON:  Prior chest radiographs 11/10/2022 and earlier. FINDINGS: Cardiomegaly. No appreciable airspace consolidation or pulmonary edema. No evidence of pleural effusion or pneumothorax. No acute osseous abnormality identified. Degenerative changes of the spine. IMPRESSION: 1. No evidence of an acute cardiopulmonary abnormality. 2. Cardiomegaly. Electronically Signed   By: Jackey Loge D.O.   On: 01/03/2023 15:32    EKG: Independently reviewed.  Sinus rhythm, RBBB, LAFB, LVH, prolonged QT.   Assessment/Plan   1. Acute encephalopathy  - In ED, TSH is normal, EtOH undetectable, SCr stable, no electrolyte derangement, no evidence for infection, and no acute findings on MRI brain  - Appreciate neurology consultation, EEG was ordered and TME workup recommended  - Check ammonia, B12, mag, phos, osmolality, and blood gas, follow-up EEG findings, use delirium precautions    2. Dementia  - Delirium precautions   3. Hypertension  - Continue hydralazine    4. Chronic HFpEF  - Appears compensated - Continue Lasix    5. Type II DM  - A1c was 8.6% in September 2024  - Check CBGs and use low-intensity SSI for now    6. Hx of CVA  - Continue Plavix and Crestor    DVT prophylaxis: Sq heparin  Code Status: DNR  Level of Care: Level of care: Telemetry Medical Family Communication: Wife updated from ED  Disposition Plan:  Patient is from: Home  Anticipated d/c is to: TBD Anticipated d/c date is: 10/31 or 01/05/23  Patient currently: Pending EEG, further lab workup  Consults called: Neurology  Admission status: Observation     Briscoe Deutscher, MD [facility]  01/03/2023, 9:33 PM

## 2023-01-03 NOTE — Progress Notes (Signed)
Ed hookup, [facility] NOT MONITORING. HU charge captured

## 2023-01-03 NOTE — ED Provider Notes (Signed)
Received patient in turnover from Dr. Rush Landmark.  Please see their note for further details of Hx, PE.  Briefly patient is a 89 y.o. male with a Neurologic Problem .  Patient showed up and had minimal speech and had eye deviation to the right.  Case was discussed with neurology plan to evaluate the patient at bedside.  Neurology Dr. Iver Nestle is concerned that the patient may be having subclinical seizures.  Recommending continuous EEG.  Recommends medical admission.  Adela Lank, Jesusita Oka, DO 01/03/23 2006

## 2023-01-04 DIAGNOSIS — R569 Unspecified convulsions: Secondary | ICD-10-CM

## 2023-01-04 DIAGNOSIS — G9340 Encephalopathy, unspecified: Secondary | ICD-10-CM | POA: Diagnosis not present

## 2023-01-04 DIAGNOSIS — G928 Other toxic encephalopathy: Secondary | ICD-10-CM | POA: Diagnosis not present

## 2023-01-04 LAB — CBC
HCT: 37.3 % — ABNORMAL LOW (ref 39.0–52.0)
Hemoglobin: 11.4 g/dL — ABNORMAL LOW (ref 13.0–17.0)
MCH: 24.1 pg — ABNORMAL LOW (ref 26.0–34.0)
MCHC: 30.6 g/dL (ref 30.0–36.0)
MCV: 78.9 fL — ABNORMAL LOW (ref 80.0–100.0)
Platelets: 228 10*3/uL (ref 150–400)
RBC: 4.73 MIL/uL (ref 4.22–5.81)
RDW: 14.8 % (ref 11.5–15.5)
WBC: 5.9 10*3/uL (ref 4.0–10.5)
nRBC: 0 % (ref 0.0–0.2)

## 2023-01-04 LAB — BLOOD GAS, VENOUS
Acid-Base Excess: 2.7 mmol/L — ABNORMAL HIGH (ref 0.0–2.0)
Bicarbonate: 27.9 mmol/L (ref 20.0–28.0)
Drawn by: 70495
O2 Saturation: 65.5 %
Patient temperature: 36.7
pCO2, Ven: 43 mm[Hg] — ABNORMAL LOW (ref 44–60)
pH, Ven: 7.41 (ref 7.25–7.43)
pO2, Ven: 35 mm[Hg] (ref 32–45)

## 2023-01-04 LAB — CORTISOL: Cortisol, Plasma: 8 ug/dL

## 2023-01-04 LAB — URINALYSIS, ROUTINE W REFLEX MICROSCOPIC
Bacteria, UA: NONE SEEN
Bilirubin Urine: NEGATIVE
Glucose, UA: NEGATIVE mg/dL
Ketones, ur: NEGATIVE mg/dL
Leukocytes,Ua: NEGATIVE
Nitrite: NEGATIVE
Protein, ur: NEGATIVE mg/dL
RBC / HPF: >50 RBC/hpf (ref 0–5)
Specific Gravity, Urine: 1.031 — ABNORMAL HIGH (ref 1.005–1.030)
pH: 7 (ref 5.0–8.0)

## 2023-01-04 LAB — BASIC METABOLIC PANEL
Anion gap: 11 (ref 5–15)
BUN: 17 mg/dL (ref 8–23)
CO2: 27 mmol/L (ref 22–32)
Calcium: 9.6 mg/dL (ref 8.9–10.3)
Chloride: 100 mmol/L (ref 98–111)
Creatinine, Ser: 1.4 mg/dL — ABNORMAL HIGH (ref 0.61–1.24)
GFR, Estimated: 47 mL/min — ABNORMAL LOW (ref >60)
Glucose, Bld: 100 mg/dL — ABNORMAL HIGH (ref 70–99)
Potassium: 3.8 mmol/L (ref 3.5–5.1)
Sodium: 138 mmol/L (ref 135–145)

## 2023-01-04 LAB — AMMONIA: Ammonia: 23 umol/L (ref 9–35)

## 2023-01-04 LAB — GLUCOSE, CAPILLARY
Glucose-Capillary: 96 mg/dL (ref 70–99)
Glucose-Capillary: 184 mg/dL — ABNORMAL HIGH (ref 70–99)
Glucose-Capillary: 111 mg/dL — ABNORMAL HIGH (ref 70–99)

## 2023-01-04 LAB — PHOSPHORUS: Phosphorus: 3.5 mg/dL (ref 2.5–4.6)

## 2023-01-04 LAB — MAGNESIUM: Magnesium: 2 mg/dL (ref 1.7–2.4)

## 2023-01-04 LAB — OSMOLALITY: Osmolality: 297 mosm/kg — ABNORMAL HIGH (ref 275–295)

## 2023-01-04 LAB — VITAMIN B12: Vitamin B-12: 515 pg/mL (ref 180–914)

## 2023-01-04 MED ORDER — ORAL CARE MOUTH RINSE: 15.0000 mL | OROMUCOSAL | Status: DC | PRN

## 2023-01-04 NOTE — Progress Notes (Signed)
LTM EEG discontinued - no skin breakdown at unhook.   

## 2023-01-04 NOTE — Procedures (Addendum)
Patient Name: Grant Jensen.  MRN: [id number]  Epilepsy Attending: Charlsie Quest  Referring Physician/Provider: Caryl Pina, MD  Duration: 01/03/2023 2110 to 01/04/2023 0865  Patient history: 89 y.o. male with medical history significant for hypertension, type 2 diabetes mellitus, blindness, dementia, and history of CVA who presents with slurred speech, weakness, and gaze deviation. EEG to evaluate for seizure  Level of alertness: Awake, asleep  AEDs during EEG study: None  Technical aspects: This EEG study was done with scalp electrodes positioned according to the 10-20 International system of electrode placement. Electrical activity was reviewed with band pass filter of 1-70Hz , sensitivity of 7 uV/mm, display speed of 66mm/sec with a 60Hz  notched filter applied as appropriate. EEG data were recorded continuously and digitally stored.  Video monitoring was available and reviewed as appropriate.  Description: The posterior dominant rhythm consists of 7.5 Hz activity of moderate voltage (25-35 uV) seen predominantly in posterior head regions, symmetric and reactive to eye opening and eye closing. Sleep was characterized by sleep spindles (12-14hz ), maximal fronto-central region. EEG showed intermittent generalized 3 to 6 Hz theta-delta slowing. Hyperventilation and photic stimulation were not performed.     ABNORMALITY - Intermittent slow, generalized  IMPRESSION: This study is suggestive of mild diffuse encephalopathy. No seizures or epileptiform discharges were seen throughout the recording.  Pakou Rainbow Annabelle Harman

## 2023-01-04 NOTE — Discharge Summary (Signed)
Physician Discharge Summary  Grant Jensen. GNF:[id number] DOB: 1928-06-27 DOA: 01/03/2023  PCP: Thana Ates, MD  Admit date: 01/03/2023 Discharge date: 01/04/2023  Admitted From: Home Discharge disposition: Home  Recommendations at discharge:  Cautious use of mood altering medications.  Brief narrative: Grant Heilbrun. is a 89 y.o. male with PMH significant for DM2, HTN, stroke, blindness, dementia.  Patient lives at home with his wife.  At baseline, he is oriented to person and place only and requires assistant with all ADLs due to blindness and dementia. 10/30, patient was brought to the ED from home for slurred speech, weakness, and gaze deviation. Patient went to bed the previous night in his usual state of health woke up in the morning with above symptoms.  He was also less conversational and looked more lethargic than usual.  In the ED, patient was afebrile, blood pressure in 140s. Labs with WBC count normal, hemoglobin 10.8, BUN/creatinine 18/1.68 Blood alcohol level not elevated. Chest x-ray unremarkable. CT angio head and neck was negative for emergent large vessel occlusion. There was initial concern of acute stroke.  But MRI brain was negative for stroke. Neurology was consulted.  The etiology of his presentation.  Was suspected to be toxic metabolic vs seizure related. Admitted to [facility] for further evaluation and management.  Of note, on further discussion of history with patient's wife, list of medicines were reviewed.  Patient is on trazodone as needed.  She stated that she by mistake gave him full dose instead of after trazodone the night before and hence the prolonged sedation next morning.  Subjective: Patient was seen and examined this morning.  [name].  Has dementia and blindness.  Eyes to open eyes on touch. Wife at bedside who helped with history. Chart reviewed Remains afebrile and hemodynamically stable Labs this morning with  creatinine down to 1.4 Patient believes that he is at his baseline status now.  Assessment and plan: Acute metabolic encephalopathy  Underlying dementia and blindness Presented with slurred speech weakness, gaze deviation, lethargy.  Initial concern of stroke.  MRI and CT angio head and neck negative.   Urinalysis unremarkable. Neurology consult appreciated.   EEG and long-term EEG monitoring did not suggest seizure.  AED was not started. Based on history revised with patient's wife, she had by mistake given him double dose of trazodone with probably was the cause of excessive sedation. Delirium precautions Continue Celexa and as needed trazodone half tab only. Continue donepezil  H/o CVA Continue Plavix and Crestor   Chronic diastolic CHF Essential hypertension  PTA meds- hydralazine 10 mg twice daily, Imdur 30 mg daily, Lasix 40 mg daily Continue all  CKD 3B Creatinine remains at baseline. Recent Labs    04/17/22 0819 04/18/22 1402 11/10/22 2014 11/11/22 0413 11/12/22 0420 11/13/22 0442 11/14/22 0406 01/03/23 1221 01/03/23 1236 01/04/23 0411  BUN 16 18 30* 29* 26* 30* 33* 18 19 17   CREATININE 1.59* 1.51* 1.93* 1.74* 1.89* 2.16* 1.85* 1.68* 1.60* 1.40*   Type 2 diabetes mellitus A1c 8.6 on 11/11/2022 PTA meds-glimepiride 1 mg daily, metformin 500 mg daily Okay to continue glimepiride.  I would recommend to stop metformin because of chronically elevated creatinine.  Goals of care   Code Status: Limited: Do not attempt resuscitation (DNR) -DNR-LIMITED -Do Not Intubate/DNI    Wounds:  -    Discharge Exam:   Vitals:   01/04/23 0251 01/04/23 0713 01/04/23 0744 01/04/23 1108  BP: (!) 140/72 (!) 124/55  Marland Kitchen)  110/54  Pulse:  71  80  Resp: 14 18  12   Temp: (!) 97.4 F (36.3 C) (!) 97.5 F (36.4 C)  97.8 F (36.6 C)  TempSrc: Oral Oral  Axillary  SpO2: 100% 100% 100% 100%  Weight: 77.2 kg     Height:        Body mass index is 30.15 kg/m.  General exam:  [name]. Skin: No rashes, lesions or ulcers. HEENT: Atraumatic, normocephalic, no obvious bleeding Lungs: Clear to auscultation bilaterally CVS: Regular rate and rhythm, no murmur GI/Abd soft, nontender, nondistended, bowels are present CNS: Demented and blind at baseline.  Responds to touch.  Mental status at baseline per wife Psychiatry: Sad affect Extremities: No pedal edema, no calf tenderness  Follow ups:    Follow-up Information     Thana Ates, MD Follow up.   Specialty: Internal Medicine Contact information: [address] [place] [address] [phone]                 Discharge Instructions:   Discharge Instructions     Call MD for:  difficulty breathing, headache or visual disturbances   Complete by: As directed    Call MD for:  extreme fatigue   Complete by: As directed    Call MD for:  hives   Complete by: As directed    Call MD for:  persistant dizziness or light-headedness   Complete by: As directed    Call MD for:  persistant nausea and vomiting   Complete by: As directed    Call MD for:  severe uncontrolled pain   Complete by: As directed    Call MD for:  temperature >100.4   Complete by: As directed    Diet Carb Modified   Complete by: As directed    Diet general   Complete by: As directed    Discharge instructions   Complete by: As directed    Recommendations at discharge:   Cautious use of mood altering medications.  General discharge instructions: Follow with Primary MD Thana Ates, MD in 7 days  Please request your PCP  to go over your hospital tests, procedures, radiology results at the follow up. Please get your medicines reviewed and adjusted.  Your PCP may decide to repeat certain labs or tests as needed. Do not drive, operate heavy machinery, perform activities at heights, swimming or participation in water activities or provide baby sitting services if your were admitted for  syncope or siezures until you have seen by Primary MD or a Neurologist and advised to do so again. [place] Controlled Substance Reporting System database was reviewed. Do not drive, operate heavy machinery, perform activities at heights, swim, participate in water activities or provide baby-sitting services while on medications for pain, sleep and mood until your outpatient physician has reevaluated you and advised to do so again.  You are strongly recommended to comply with the dose, frequency and duration of prescribed medications. Activity: As tolerated with Full fall precautions use walker/cane & assistance as needed Avoid using any recreational substances like cigarette, tobacco, alcohol, or non-prescribed drug. If you experience worsening of your admission symptoms, develop shortness of breath, life threatening emergency, suicidal or homicidal thoughts you must seek medical attention immediately by calling 911 or calling your MD immediately  if symptoms less severe. You must read complete instructions/literature along with all the possible adverse reactions/side effects for all the medicines you take and that have been  prescribed to you. Take any new medicine only after you have completely understood and accepted all the possible adverse reactions/side effects.  Wear Seat belts while driving. You were cared for by a hospitalist during your hospital stay. If you have any questions about your discharge medications or the care you received while you were in the hospital after you are discharged, you can call the unit and ask to speak with the hospitalist or the covering physician. Once you are discharged, your primary care physician will handle any further medical issues. Please note that NO REFILLS for any discharge medications will be authorized once you are discharged, as it is imperative that you return to your primary care physician (or establish a relationship with a primary care physician if  you do not have one).   Increase activity slowly   Complete by: As directed        Discharge Medications:   Allergies as of 01/04/2023       Reactions   Penicillins Other (See Comments)   Lisinopril Cough   Penicillin G Hives, Rash        Medication List     STOP taking these medications    metFORMIN 500 MG tablet Commonly known as: GLUCOPHAGE       TAKE these medications    acetaminophen 325 MG tablet Commonly known as: TYLENOL Take 2 tablets (650 mg total) by mouth every 4 (four) hours as needed for mild pain (or temp > 37.5 C (99.5 F)).   albuterol 108 (90 Base) MCG/ACT inhaler Commonly known as: VENTOLIN HFA Inhale 2 puffs into the lungs every 4 (four) hours as needed for wheezing or shortness of breath.   citalopram 10 MG tablet Commonly known as: CELEXA Take 10 mg by mouth daily.   clopidogrel 75 MG tablet Commonly known as: PLAVIX Take 1 tablet (75 mg total) by mouth daily.   donepezil 5 MG disintegrating tablet Commonly known as: ARICEPT ODT Take 10 mg by mouth at bedtime.   furosemide 40 MG tablet Commonly known as: LASIX Take 1 tablet (40 mg total) by mouth daily.   glimepiride 1 MG tablet Commonly known as: AMARYL Take 1 tablet (1 mg total) by mouth every morning.   hydrALAZINE 10 MG tablet Commonly known as: APRESOLINE Take 1 tablet (10 mg total) by mouth in the morning and at bedtime.   ipratropium-albuterol 0.5-2.5 (3) MG/3ML Soln Commonly known as: DUONEB Take 3 mLs by nebulization every 6 (six) hours as needed (SOB).   isosorbide mononitrate 30 MG 24 hr tablet Commonly known as: IMDUR Take 1 tablet (30 mg total) by mouth daily.   pantoprazole 40 MG tablet Commonly known as: Protonix Take 1 tablet (40 mg total) by mouth daily.   rosuvastatin 20 MG tablet Commonly known as: CRESTOR Take 1 tablet (20 mg total) by mouth daily.   Symbicort 80-4.5 MCG/ACT inhaler Generic drug: budesonide-formoterol Inhale 2 puffs into the  lungs 2 (two) times daily.   traZODone 50 MG tablet Commonly known as: DESYREL Take 25 mg by mouth at bedtime as needed for sleep (1/2 tablet as needed).         The results of significant diagnostics from this hospitalization (including imaging, microbiology, ancillary and laboratory) are listed below for reference.    Procedures and Diagnostic Studies:   Overnight EEG with video  Result Date: 01/04/2023 Charlsie Quest, MD     01/04/2023  6:37 AM Patient Name: Grant Jensen. MRN: [id number] Epilepsy Attending: Lorrin Jackson  Annabelle Harman Referring Physician/Provider: Caryl Pina, MD Duration: 01/03/2023 2110 to 01/04/2023 0630 Patient history: 89 y.o. male with medical history significant for hypertension, type 2 diabetes mellitus, blindness, dementia, and history of CVA who presents with slurred speech, weakness, and gaze deviation. EEG to evaluate for seizure Level of alertness: Awake, asleep AEDs during EEG study: None Technical aspects: This EEG study was done with scalp electrodes positioned according to the 10-20 International system of electrode placement. Electrical activity was reviewed with band pass filter of 1-70Hz , sensitivity of 7 uV/mm, display speed of 54mm/sec with a 60Hz  notched filter applied as appropriate. EEG data were recorded continuously and digitally stored.  Video monitoring was available and reviewed as appropriate. Description: The posterior dominant rhythm consists of 7.5 Hz activity of moderate voltage (25-35 uV) seen predominantly in posterior head regions, symmetric and reactive to eye opening and eye closing. Sleep was characterized by sleep spindles (12-14hz ), maximal fronto-central region. EEG showed intermittent generalized 3 to 6 Hz theta-delta slowing. Hyperventilation and photic stimulation were not performed.   ABNORMALITY - Intermittent slow, generalized IMPRESSION: This study is suggestive of mild diffuse encephalopathy. No seizures or epileptiform  discharges were seen throughout the recording. Charlsie Quest   CT ANGIO HEAD NECK W WO CM  Result Date: 01/03/2023 CLINICAL DATA:  Neuro deficit, acute, stroke suspected Previous stroke on aspirin and Plavix, speech abnormality today, diffusely weak per family. Also fall overnight. Rule out stroke versus LVO versus traumatic injuries EXAM: CT ANGIOGRAPHY HEAD AND NECK WITH AND WITHOUT CONTRAST TECHNIQUE: Multidetector CT imaging of the head and neck was performed using the standard protocol during bolus administration of intravenous contrast. Multiplanar CT image reconstructions and MIPs were obtained to evaluate the vascular anatomy. Carotid stenosis measurements (when applicable) are obtained utilizing NASCET criteria, using the distal internal carotid diameter as the denominator. RADIATION DOSE REDUCTION: This exam was performed according to the departmental dose-optimization program which includes automated exposure control, adjustment of the mA and/or kV according to patient size and/or use of iterative reconstruction technique. CONTRAST:  60mL OMNIPAQUE IOHEXOL 350 MG/ML SOLN COMPARISON:  None Available. FINDINGS: CT HEAD FINDINGS Brain: No evidence of acute infarction, hemorrhage, hydrocephalus, extra-axial collection or mass lesion/mass effect. Vascular: See below. Skull: No acute fracture. Sinuses/Orbits: Clear sinuses.  No acute orbital findings. Other: No mastoid effusions. Review of the MIP images confirms the above findings CTA NECK FINDINGS Aortic arch: Great vessel origins are patent without significant stenosis. Atherosclerosis. Right carotid system: No evidence of dissection, stenosis (50% or greater), or occlusion. Left carotid system: No evidence of dissection, stenosis (50% or greater), or occlusion. Vertebral arteries: Right dominant. Patent. Severe stenosis of the right V2 vertebral artery at the C5-C6 level. Severe left vertebral artery origin stenosis. Skeleton: No acute abnormality  on limited assessment. Other neck: No acute abnormality on limited assessment. Upper chest: Visualized lung apices are clear. Review of the MIP images confirms the above findings CTA HEAD FINDINGS Anterior circulation: Bilateral intracranial ICAs, MCAs, and ACAs are patent without proximal high-grade stenosis. Posterior circulation: Bilateral intradural vertebral arteries, basilar artery and bilateral posterior cerebral arteries are patent. Moderate right P2 PCA stenosis. Venous sinuses: As permitted by contrast timing, patent. Review of the MIP images confirms the above findings IMPRESSION: 1. No emergent large vessel occlusion. 2. Severe stenosis of the right V2 vertebral artery at the C5-C6. 3. Severe stenosis of the nondominant left vertebral artery origin. 4. Moderate right P2 PCA stenosis.  No emergent large Electronically Signed  By: Feliberto Harts M.D.   On: 01/03/2023 17:23   MR BRAIN WO CONTRAST  Result Date: 01/03/2023 CLINICAL DATA:  Abnormal speech, right gaze preference, weakness EXAM: MRI HEAD WITHOUT CONTRAST TECHNIQUE: Multiplanar, multiecho pulse sequences of the brain and surrounding structures were obtained without intravenous contrast. COMPARISON:  Brain MRI 04/09/2022 FINDINGS: Brain: There is no acute intracranial hemorrhage, extra-axial fluid collection, or acute infarct. There is moderate to advanced parenchymal volume loss with prominence of the ventricular system and extra-axial CSF spaces there is marked disproportionate bilateral hippocampal atrophy. Confluent FLAIR signal abnormality in the supratentorial white matter likely reflects moderate to advanced chronic small-vessel ischemic change. The pituitary and suprasellar region are normal. There is no mass lesion. There is no mass effect or midline shift. Vascular: Normal flow voids. Skull and upper cervical spine: Normal marrow signal. Sinuses/Orbits: The paranasal sinuses are clear. Bilateral lens implants are in place. The  globes and orbits are otherwise unremarkable. Other: The mastoid air cells and middle ear cavities are clear. IMPRESSION: No acute intracranial pathology. Electronically Signed   By: Lesia Hausen M.D.   On: 01/03/2023 16:10   DG Chest Portable 1 View  Result Date: 01/03/2023 CLINICAL DATA:  Provided history: Fall. Slurred speech. Right-sided weakness. EXAM: PORTABLE CHEST 1 VIEW COMPARISON:  Prior chest radiographs 11/10/2022 and earlier. FINDINGS: Cardiomegaly. No appreciable airspace consolidation or pulmonary edema. No evidence of pleural effusion or pneumothorax. No acute osseous abnormality identified. Degenerative changes of the spine. IMPRESSION: 1. No evidence of an acute cardiopulmonary abnormality. 2. Cardiomegaly. Electronically Signed   By: Jackey Loge D.O.   On: 01/03/2023 15:32     Labs:   Basic Metabolic Panel: Recent Labs  Lab 01/03/23 1221 01/03/23 1236 01/04/23 0411  NA 140 141 138  K 3.5 3.4* 3.8  CL 103 104 100  CO2 23  --  27  GLUCOSE 142* 135* 100*  BUN 18 19 17   CREATININE 1.68* 1.60* 1.40*  CALCIUM 9.4  --  9.6  MG  --   --  2.0  PHOS  --   --  3.5   GFR Estimated Creatinine Clearance: 31 mL/min (A) (by C-G formula based on SCr of 1.4 mg/dL (H)). Liver Function Tests: Recent Labs  Lab 01/03/23 1221  AST 30  ALT 17  ALKPHOS 61  BILITOT 0.4  PROT 7.0  ALBUMIN 3.9   No results for input(s): "LIPASE", "AMYLASE" in the last 168 hours. Recent Labs  Lab 01/04/23 0411  AMMONIA 23   Coagulation profile Recent Labs  Lab 01/03/23 1221  INR 1.0    CBC: Recent Labs  Lab 01/03/23 1221 01/03/23 1236 01/04/23 0411  WBC 7.4  --  5.9  NEUTROABS 3.6  --   --   HGB 10.8* 12.6* 11.4*  HCT 35.4* 37.0* 37.3*  MCV 79.4*  --  78.9*  PLT 217  --  228   Cardiac Enzymes: No results for input(s): "CKTOTAL", "CKMB", "CKMBINDEX", "TROPONINI" in the last 168 hours. BNP: Invalid input(s): "POCBNP" CBG: Recent Labs  Lab 01/03/23 1719 01/03/23 2141  01/04/23 0609 01/04/23 1101  GLUCAP 117* 91 96 184*   D-Dimer No results for input(s): "DDIMER" in the last 72 hours. Hgb A1c No results for input(s): "HGBA1C" in the last 72 hours. Lipid Profile No results for input(s): "CHOL", "HDL", "LDLCALC", "TRIG", "CHOLHDL", "LDLDIRECT" in the last 72 hours. Thyroid function studies Recent Labs    01/03/23 1504  TSH 4.155   Anemia work up [employer]  01/04/23 0411  VITAMINB12 515   Microbiology No results found for this or any previous visit (from the past 240 hour(s)).  Time coordinating discharge: 45 minutes  Signed: Annais Crafts  [facility] 01/04/2023, 1:36 PM

## 2023-01-04 NOTE — Progress Notes (Signed)
NEUROLOGY CONSULT FOLLOW UP NOTE   Date of service: January 04, 2023 Patient Name: Grant Jensen. MRN:  [id number] DOB:  Sep 12, 1928  Brief HPI  Grant Jensen. is a 89 y.o. male with history of blindness, dementia, diabetes, glaucoma and hypertension who presents with weakness, slurred speech and right gaze deviation which occurred after he woke up this morning.  Patient's wife, who is his caregiver at home, states that she woke him up at 8 AM as usual but did not want to get out of bed.  At 9:00, she wanted to get him up to give him breakfast because he is a diabetic, but he was unable to get out of bed even with assistance.  Speech was noted to be slurred.  Per wife, at baseline he is oriented to person and place only and requires assistance with all ADLs due to blindness and dementia.  On arrival to the ED, he was not speaking much and had some right gaze deviation, which is now resolved.  MRI brain was negative for acute abnormality.  She notes it is very unusual for him to be lethargic like this all day long and that normally he is much more participatory.  She did also note a right gaze preference this morning and notes that usually despite being blind he will look around and orient to the voice of whomever is speaking which he was not doing    Interval Hx/subjective   On further history it appears that the medication reconciliation is not accurate.  Wife notes she gave the patient trazodone 50 mg the night prior to admission (on her medication list on her phone it is listed as 1/2 tablet as needed but she gave him a full tablet)  She notes she uses this rarely but it also does not seem to help very much with helping him sleep through the night  Vitals   Vitals:   01/04/23 0157 01/04/23 0251 01/04/23 0713 01/04/23 0744  BP:  (!) 140/72 (!) 124/55   Pulse:   71   Resp:  14 18   Temp: 97.9 F (36.6 C) (!) 97.4 F (36.3 C) (!) 97.5 F (36.4 C)   TempSrc: Oral Oral Oral   SpO2:   100% 100% 100%  Weight:  77.2 kg    Height:         Body mass index is 30.15 kg/m.  Physical Exam   Constitutional: No acute distress, resting comfortably in hospital bed Psych: Affect appropriate, irritable about mitts in place Eyes: No scleral injection. Head: Normocephalic.  Respiratory: Effort normal, non-labored breathing.   Neurologic Examination   Wife notes patient is at his baseline, calls in by his nickname "[name]".  He opens his eyes, states he is not doing well because of the things on his hands lip, which he immediately tries to remove.  He orients to examiner bilaterally (EOMI to tracking examiner, hearing intact to voice).  Face is symmetric.  Using bilateral upper extremities equally.   Labs and Diagnostic Imaging   CBC:  Recent Labs  Lab 01/03/23 1221 01/03/23 1236 01/04/23 0411  WBC 7.4  --  5.9  NEUTROABS 3.6  --   --   HGB 10.8* 12.6* 11.4*  HCT 35.4* 37.0* 37.3*  MCV 79.4*  --  78.9*  PLT 217  --  228    Basic Metabolic Panel:  Lab Results  Component Value Date   NA 138 01/04/2023   K 3.8 01/04/2023  CO2 27 01/04/2023   GLUCOSE 100 (H) 01/04/2023   BUN 17 01/04/2023   CREATININE 1.40 (H) 01/04/2023   CALCIUM 9.6 01/04/2023   GFRNONAA 47 (L) 01/04/2023   GFRAA 64 (L) 05/08/2014   Lipid Panel:  Lab Results  Component Value Date   LDLCALC 90 04/10/2022   HgbA1c:  Lab Results  Component Value Date   HGBA1C 8.6 (H) 11/11/2022   Urine Drug Screen: No results found for: "LABOPIA", "COCAINSCRNUR", "LABBENZ", "AMPHETMU", "THCU", "LABBARB"  Alcohol Level     Component Value Date/Time   ETH <10 01/03/2023 1221   INR  Lab Results  Component Value Date   INR 1.0 01/03/2023   APTT  Lab Results  Component Value Date   APTT 28 01/03/2023    EEG 01/03/2023 2110 to 01/04/2023 0630 :  Intermittent slow, generalized   Impression   Overall favor this episode to be secondary to oversedation from trazodone which was given at twice the  prescribed dose listed on patient's home medication list (from wife's phone).  However I did discuss with wife that this would not necessarily explain the marked right gaze preference that was described; unilateral gaze preference with negative MRI brain is concerning for possible seizure or postictal state.  Given this event occurred only 1 time, a prolonged EEG was negative, and there are other confounding factors (oversedation), I do not feel that the risk of adding an antiseizure medication is outweighed by the benefit at this time.  However if he has recurrent events that are stereotyped in nature, could consider antiseizure medication or repeat EEG at the time.   Recommendations  -Discontinue long-term EEG monitoring -Trazodone and donepezil added to his medication reconciliation -Pharmacy consult placed to perform full review of medication reconciliation to confirm accuracy of other medications for patient safety -Wife counseled not to use more than 1/2 tablet of trazodone -No indication to start antiseizure medications at this time -Outpatient follow-up to consider risk/benefit of alternative sleep aids -Wife is eager for discharge, no further inpatient neurological needs, medical clearance per primary team -Inpatient neurology will sign off at this time, but we are available if other questions or concerns arise.  Please do not hesitate to reach out -Recommendations conveyed to primary team via secure chat ______________________________________________________________________   Thank you for the opportunity to take part in the care of this patient. If you have any further questions, please contact the neurology consultation team on call. Updated oncall schedule is listed on AMION.  Signed,  Star Cheese L Beonka Amesquita  Greater than 35 minutes spent in care of this patient, majority at bedside

## 2023-01-04 NOTE — Progress Notes (Signed)
Maint done, no skin breakdown. Pt moving rooms soon

## 2023-01-04 NOTE — Progress Notes (Signed)
Pt IV, tele, and condom cath removed. Pts wife was given AVS. Pt and pt wife educated on medications, follow up apts, and discharge instructions. Pts wife denies any questions or concerns. Wheel chair off unit, wife transporting patient home.

## 2023-01-04 NOTE — Progress Notes (Signed)
Pt wife accidentally pulled out the yellow cord from the wall. RN plugged back on and left a message for the EEG.

## 2023-01-04 NOTE — TOC Transition Note (Addendum)
Transition of Care [facility]) - CM/SW Discharge Note   Patient Details  Name: Grant Jensen. MRN: [id number] Date of Birth: 1928/08/21  Transition of Care [facility]) CM/SW Contact:  Leone Haven, RN Phone Number: 01/04/2023, 2:02 PM   Clinical Narrative:    For dc, wife will transport him home. Has no needs.  Food resources given to wife.   Final next level of care: Home/Self Care Barriers to Discharge: No Barriers Identified   Patient Goals and CMS Choice   Choice offered to / list presented to : NA  Discharge Placement                         Discharge Plan and Services Additional resources added to the After Visit Summary for   In-house Referral: NA Discharge Planning Services: CM Consult Post Acute Care Choice: NA          DME Arranged: N/A DME Agency: NA       HH Arranged: NA          Social Determinants of Health (SDOH) Interventions SDOH Screenings   Food Insecurity: Food Insecurity Present (01/04/2023)  Housing: Low Risk  (01/04/2023)  Transportation Needs: No Transportation Needs (01/04/2023)  Utilities: Not At Risk (01/04/2023)  Depression (PHQ2-9): High Risk (05/02/2022)  Tobacco Use: Unknown (01/03/2023)     Readmission Risk Interventions    11/13/2022    4:22 PM  Readmission Risk Prevention Plan  Transportation Screening Complete  Home Care Screening Complete  Medication Review (RN CM) Complete

## 2023-01-04 NOTE — TOC Initial Note (Signed)
Transition of Care (TOC) - Initial/Assessment Note    Patient Details  Name: Grant Jensen. MRN: [id number] Date of Birth: May 13, 1928  Transition of Care [facility]) CM/SW Contact:    Leone Haven, RN Phone Number: 01/04/2023, 1:56 PM  Clinical Narrative:                 From home with spouse, has PCP and insurance on file, wife at bedside states Frances Furbish just signed off for [employer] services and they do not need anymore, he has w/chair, bsc, shower chair and walker at home.  He also goes to [facility] adult day [facility] , NCM will fax the letter to them from MD stating that it is ok for him to return on Monday .  Fax number is [phone].  Wife will transport him  home at dc and family is support system, states gets medications from [facility] on [address]  Pta self ambulatory with walker.   Expected Discharge Plan: Home/Self Care Barriers to Discharge: No Barriers Identified   Patient Goals and CMS Choice Patient states their goals for this hospitalization and ongoing recovery are:: return home   Choice offered to / list presented to : NA      Expected Discharge Plan and Services In-house Referral: NA Discharge Planning Services: CM Consult Post Acute Care Choice: NA Living arrangements for the past 2 months: Single Family Home Expected Discharge Date: 01/04/23               DME Arranged: N/A DME Agency: NA       HH Arranged: NA          Prior Living Arrangements/Services Living arrangements for the past 2 months: Single Family Home Lives with:: Spouse Patient language and need for interpreter reviewed:: Yes Do you feel safe going back to the place where you live?: Yes      Need for Family Participation in Patient Care: Yes (Comment) Care giver support system in place?: Yes (comment) Current home services: DME (w/chair, bsc, shower chair, walker) Criminal Activity/Legal Involvement Pertinent to Current Situation/Hospitalization: No - Comment as needed  Activities of  Daily Living   ADL Screening (condition at time of admission) Independently performs ADLs?: No Does the patient have a NEW difficulty with bathing/dressing/toileting/self-feeding that is expected to last >3 days?: No Does the patient have a NEW difficulty with getting in/out of bed, walking, or climbing stairs that is expected to last >3 days?: Yes (Initiates electronic notice to provider for possible PT consult) (reason for admit) Does the patient have a NEW difficulty with communication that is expected to last >3 days?: Yes (Initiates electronic notice to provider for possible SLP consult) (reason for admit) Is the patient deaf or have difficulty hearing?: No Does the patient have difficulty seeing, even when wearing glasses/contacts?: Yes Does the patient have difficulty concentrating, remembering, or making decisions?: Yes  Permission Sought/Granted Permission sought to share information with : Case Manager Permission granted to share information with : Yes, Verbal Permission Granted              Emotional Assessment Appearance:: Appears stated age       Alcohol / Substance Use: Not Applicable Psych Involvement: No (comment)  Admission diagnosis:  Speech problem [R47.9] Acute encephalopathy [G93.40] Patient Active Problem List   Diagnosis Date Noted   Acute encephalopathy 01/03/2023   Chronic diastolic CHF (congestive heart failure) (HCC) 11/11/2022   CKD stage 3b, GFR 30-44 ml/min (HCC) 11/11/2022  Microcytic anemia 11/11/2022   Small vessel cerebrovascular accident (CVA) (HCC) 04/14/2022   History of stroke 04/09/2022   Rhabdomyolysis 05/01/2020   Dementia (HCC) 05/01/2020   Fall at home, initial encounter 05/01/2020   Type 2 diabetes mellitus with hyperlipidemia (HCC) 05/01/2020   Essential hypertension 05/01/2020   NICM (nonischemic cardiomyopathy) (HCC) 05/01/2020   Chest pain with high risk for cardiac etiology 06/20/2014   PCP:  Thana Ates, MD Pharmacy:    [facility] [address] ([place]), [place] - [address] [place] ([place]) [place] [address] Phone: [phone] Fax: [phone]  [facility] [address] [place] [address] Phone: [phone] Fax: [phone]     Social Determinants of Health (SDOH) Social History: SDOH Screenings   Food Insecurity: Food Insecurity Present (01/04/2023)  Housing: Low Risk  (01/04/2023)  Transportation Needs: No Transportation Needs (01/04/2023)  Utilities: Not At Risk (01/04/2023)  Depression (PHQ2-9): High Risk (05/02/2022)  Tobacco Use: Unknown (01/03/2023)   SDOH Interventions:     Readmission Risk Interventions    11/13/2022    4:22 PM  Readmission Risk Prevention Plan  Transportation Screening Complete  Home Care Screening Complete  Medication Review (RN CM) Complete

## 2023-01-04 NOTE — Plan of Care (Signed)
Pt arrived from ER via stretcher and moved onto 3E bed by staff. ER RN reports pt removed condom cath and removed 1 IV enroute to 3E.  3E RN and NT bathed pt, and new condom cath placed. Vitals obtained and assessment done. Pt calm and cooperative but keeps pulling at cords/IV. Hand mitts placed and other IV wrapped in kerlix. Oriented pt to room/place, staff and plan of care. Pt follows directions and answers questions but confused then returns to sleep.   Problem: Education: Goal: Ability to describe self-care measures that may prevent or decrease complications (Diabetes Survival Skills Education) will improve 01/04/2023 0412 by Dorathy Daft, RN Outcome: Not Progressing 01/04/2023 0335 by Dorathy Daft, RN Outcome: Progressing   Problem: Coping: Goal: Ability to adjust to condition or change in health will improve 01/04/2023 0412 by Dorathy Daft, RN Outcome: Progressing 01/04/2023 0335 by Dorathy Daft, RN Outcome: Progressing   Problem: Fluid Volume: Goal: Ability to maintain a balanced intake and output will improve 01/04/2023 0412 by Dorathy Daft, RN Outcome: Progressing 01/04/2023 0335 by Dorathy Daft, RN Outcome: Progressing   Problem: Metabolic: Goal: Ability to maintain appropriate glucose levels will improve 01/04/2023 0412 by Dorathy Daft, RN Outcome: Progressing 01/04/2023 0335 by Dorathy Daft, RN Outcome: Progressing   Problem: Nutritional: Goal: Maintenance of adequate nutrition will improve 01/04/2023 0412 by Dorathy Daft, RN Outcome: Progressing 01/04/2023 0335 by Dorathy Daft, RN Outcome: Progressing Goal: Progress toward achieving an optimal weight will improve 01/04/2023 0412 by Dorathy Daft, RN Outcome: Progressing 01/04/2023 0335 by Dorathy Daft, RN Outcome: Progressing   Problem: Skin Integrity: Goal: Risk for impaired skin integrity will decrease 01/04/2023 0412 by Dorathy Daft, RN Outcome:  Progressing 01/04/2023 0335 by Dorathy Daft, RN Outcome: Progressing   Problem: Tissue Perfusion: Goal: Adequacy of tissue perfusion will improve 01/04/2023 0412 by Dorathy Daft, RN Outcome: Progressing 01/04/2023 0335 by Dorathy Daft, RN Outcome: Progressing   Problem: Pain Management: Goal: General experience of comfort will improve 01/04/2023 0412 by Dorathy Daft, RN Outcome: Progressing 01/04/2023 0335 by Dorathy Daft, RN Outcome: Progressing   Problem: Safety: Goal: Ability to remain free from injury will improve 01/04/2023 0412 by Dorathy Daft, RN Outcome: Progressing 01/04/2023 0335 by Dorathy Daft, RN Outcome: Progressing

## 2023-01-04 NOTE — Progress Notes (Signed)
Pt's wife arrived to 3East around 0520. Pt sleeping at time wife arrived then awoke. Speech more clear and pt answering questions more appropriately and opening eyes spontaneously. On admit, pt was keeping eyes closed and speech intermittently slurred. Wife reports pt is close to his baseline now and expressed happiness at patient's improvement from last night.  Pt drinking water and boost without difficulty.

## 2023-01-04 NOTE — Progress Notes (Signed)
Pt moved to new room. Main done, no skin breakdown

## 2023-01-16 DIAGNOSIS — E11649 Type 2 diabetes mellitus with hypoglycemia without coma: Secondary | ICD-10-CM | POA: Diagnosis not present

## 2023-01-24 DIAGNOSIS — E1122 Type 2 diabetes mellitus with diabetic chronic kidney disease: Secondary | ICD-10-CM | POA: Diagnosis not present

## 2023-01-24 DIAGNOSIS — F5101 Primary insomnia: Secondary | ICD-10-CM | POA: Diagnosis not present

## 2023-01-24 DIAGNOSIS — G301 Alzheimer's disease with late onset: Secondary | ICD-10-CM | POA: Diagnosis not present

## 2023-03-08 ENCOUNTER — Telehealth: Payer: Self-pay | Admitting: Neurology

## 2023-03-08 NOTE — Telephone Encounter (Signed)
 LVM and sent mychart msg informing pt of need to reschedule 04/10/23 appt - MD out

## 2023-03-15 DIAGNOSIS — F5101 Primary insomnia: Secondary | ICD-10-CM | POA: Diagnosis not present

## 2023-03-15 DIAGNOSIS — N1832 Chronic kidney disease, stage 3b: Secondary | ICD-10-CM | POA: Diagnosis not present

## 2023-03-15 DIAGNOSIS — E1122 Type 2 diabetes mellitus with diabetic chronic kidney disease: Secondary | ICD-10-CM | POA: Diagnosis not present

## 2023-03-15 DIAGNOSIS — M25562 Pain in left knee: Secondary | ICD-10-CM | POA: Diagnosis not present

## 2023-03-15 DIAGNOSIS — J42 Unspecified chronic bronchitis: Secondary | ICD-10-CM | POA: Diagnosis not present

## 2023-03-15 DIAGNOSIS — R051 Acute cough: Secondary | ICD-10-CM | POA: Diagnosis not present

## 2023-03-15 DIAGNOSIS — E11649 Type 2 diabetes mellitus with hypoglycemia without coma: Secondary | ICD-10-CM | POA: Diagnosis not present

## 2023-03-15 DIAGNOSIS — G301 Alzheimer's disease with late onset: Secondary | ICD-10-CM | POA: Diagnosis not present

## 2023-03-15 DIAGNOSIS — I5032 Chronic diastolic (congestive) heart failure: Secondary | ICD-10-CM | POA: Diagnosis not present

## 2023-03-18 ENCOUNTER — Other Ambulatory Visit: Payer: Self-pay | Admitting: Neurology

## 2023-03-19 NOTE — Telephone Encounter (Signed)
 Last seen on 06/06/22 per note " I also recommend increasing Aricept dose to 10 mg daily to help with his dementia.  No follow up scheduled

## 2023-04-10 ENCOUNTER — Ambulatory Visit: Payer: Medicare Other | Admitting: Neurology

## 2023-04-15 DIAGNOSIS — E11649 Type 2 diabetes mellitus with hypoglycemia without coma: Secondary | ICD-10-CM | POA: Diagnosis not present

## 2023-04-15 DIAGNOSIS — R051 Acute cough: Secondary | ICD-10-CM | POA: Diagnosis not present

## 2023-06-27 DIAGNOSIS — G301 Alzheimer's disease with late onset: Secondary | ICD-10-CM | POA: Diagnosis not present

## 2023-06-27 DIAGNOSIS — E1122 Type 2 diabetes mellitus with diabetic chronic kidney disease: Secondary | ICD-10-CM | POA: Diagnosis not present

## 2023-06-27 DIAGNOSIS — R29898 Other symptoms and signs involving the musculoskeletal system: Secondary | ICD-10-CM | POA: Diagnosis not present

## 2023-06-27 DIAGNOSIS — N1832 Chronic kidney disease, stage 3b: Secondary | ICD-10-CM | POA: Diagnosis not present

## 2023-06-27 DIAGNOSIS — J42 Unspecified chronic bronchitis: Secondary | ICD-10-CM | POA: Diagnosis not present

## 2023-06-27 DIAGNOSIS — I5032 Chronic diastolic (congestive) heart failure: Secondary | ICD-10-CM | POA: Diagnosis not present

## 2023-08-06 ENCOUNTER — Other Ambulatory Visit: Payer: Self-pay

## 2023-08-06 ENCOUNTER — Emergency Department (HOSPITAL_COMMUNITY)
Admission: EM | Admit: 2023-08-06 | Discharge: 2023-08-07 | Discharge status: Against Medical Advice | Attending: Emergency Medicine | Admitting: Emergency Medicine

## 2023-08-06 ENCOUNTER — Encounter (HOSPITAL_COMMUNITY): Payer: Self-pay

## 2023-08-06 DIAGNOSIS — R739 Hyperglycemia, unspecified: Secondary | ICD-10-CM | POA: Diagnosis not present

## 2023-08-06 DIAGNOSIS — E1165 Type 2 diabetes mellitus with hyperglycemia: Secondary | ICD-10-CM | POA: Diagnosis not present

## 2023-08-06 DIAGNOSIS — Z5321 Procedure and treatment not carried out due to patient leaving prior to being seen by health care provider: Secondary | ICD-10-CM | POA: Insufficient documentation

## 2023-08-06 DIAGNOSIS — F0390 Unspecified dementia without behavioral disturbance: Secondary | ICD-10-CM | POA: Insufficient documentation

## 2023-08-06 LAB — CBC WITH DIFFERENTIAL/PLATELET
Abs Immature Granulocytes: 0.01 10*3/uL (ref 0.00–0.07)
Basophils Absolute: 0 10*3/uL (ref 0.0–0.1)
Basophils Relative: 1 %
Eosinophils Absolute: 0.4 10*3/uL (ref 0.0–0.5)
Eosinophils Relative: 7 %
HCT: 39.2 % (ref 39.0–52.0)
Hemoglobin: 11.7 g/dL — ABNORMAL LOW (ref 13.0–17.0)
Immature Granulocytes: 0 %
Lymphocytes Relative: 56 %
Lymphs Abs: 3.6 10*3/uL (ref 0.7–4.0)
MCH: 25.3 pg — ABNORMAL LOW (ref 26.0–34.0)
MCHC: 29.8 g/dL — ABNORMAL LOW (ref 30.0–36.0)
MCV: 84.7 fL (ref 80.0–100.0)
Monocytes Absolute: 0.7 10*3/uL (ref 0.1–1.0)
Monocytes Relative: 11 %
Neutro Abs: 1.6 10*3/uL — ABNORMAL LOW (ref 1.7–7.7)
Neutrophils Relative %: 25 %
Platelets: 160 10*3/uL (ref 150–400)
RBC: 4.63 MIL/uL (ref 4.22–5.81)
RDW: 15.9 % — ABNORMAL HIGH (ref 11.5–15.5)
WBC: 6.4 10*3/uL (ref 4.0–10.5)
nRBC: 0 % (ref 0.0–0.2)

## 2023-08-06 LAB — CBG MONITORING, ED: Glucose-Capillary: 137 mg/dL — ABNORMAL HIGH (ref 70–99)

## 2023-08-06 NOTE — ED Provider Triage Note (Signed)
 Emergency Medicine Provider Triage Evaluation Note  Grant Jensen , a 89 y.o. male  was evaluated in triage.  Wife of 31 years at bedside brought patient in for hyperglycemia. Reports he was in 400s today so wife gave him extra glipizide tonight at 2100. Normally takes twice a day but wife missed night dose d/t family issues. Has been acting per normal per wife prior to today  Patient has dementia and baseline knows himself and close family members  Review of Systems  Positive: See hpi Negative:   Physical Exam  BP 123/65 (BP Location: Right Arm)   Pulse (!) 58   Temp 97.9 F (36.6 C) (Oral)   Resp 17   Wt 73.9 kg   SpO2 100%   BMI 28.87 kg/m  Gen:   Awake, no distress   Resp:  Normal effort  MSK:   Moves extremities without difficulty  Other:    Medical Decision Making  Medically screening exam initiated at 10:48 PM.  Appropriate orders placed.  Grant Jensen. was informed that the remainder of the evaluation will be completed by another provider, this initial triage assessment does not replace that evaluation, and the importance of remaining in the ED until their evaluation is complete.  Labs ordered   Royann Cords, [place] 08/06/23 2252

## 2023-08-06 NOTE — ED Triage Notes (Signed)
 Pt arrives with wife who report intermittent hyperglycemia today with readings in the 400s today. Wife gave extra glipizide tonight at 9pm. Pt CBG in the 100s now. Pt has hx of dementia and denies any pain. Wife denies pt having other symptoms.

## 2023-08-10 DIAGNOSIS — N390 Urinary tract infection, site not specified: Secondary | ICD-10-CM | POA: Diagnosis not present

## 2023-10-04 DIAGNOSIS — J42 Unspecified chronic bronchitis: Secondary | ICD-10-CM | POA: Diagnosis not present

## 2023-10-04 DIAGNOSIS — I5032 Chronic diastolic (congestive) heart failure: Secondary | ICD-10-CM | POA: Diagnosis not present

## 2023-10-04 DIAGNOSIS — N1832 Chronic kidney disease, stage 3b: Secondary | ICD-10-CM | POA: Diagnosis not present

## 2023-10-04 DIAGNOSIS — I420 Dilated cardiomyopathy: Secondary | ICD-10-CM | POA: Diagnosis not present

## 2023-10-11 DIAGNOSIS — E1122 Type 2 diabetes mellitus with diabetic chronic kidney disease: Secondary | ICD-10-CM | POA: Diagnosis not present

## 2023-10-11 DIAGNOSIS — I10 Essential (primary) hypertension: Secondary | ICD-10-CM | POA: Diagnosis not present

## 2023-10-11 DIAGNOSIS — I5032 Chronic diastolic (congestive) heart failure: Secondary | ICD-10-CM | POA: Diagnosis not present

## 2023-10-11 DIAGNOSIS — K219 Gastro-esophageal reflux disease without esophagitis: Secondary | ICD-10-CM | POA: Diagnosis not present

## 2023-10-11 DIAGNOSIS — Z8673 Personal history of transient ischemic attack (TIA), and cerebral infarction without residual deficits: Secondary | ICD-10-CM | POA: Diagnosis not present

## 2023-10-11 DIAGNOSIS — Z0000 Encounter for general adult medical examination without abnormal findings: Secondary | ICD-10-CM | POA: Diagnosis not present

## 2023-10-11 DIAGNOSIS — G301 Alzheimer's disease with late onset: Secondary | ICD-10-CM | POA: Diagnosis not present

## 2023-10-11 DIAGNOSIS — Z23 Encounter for immunization: Secondary | ICD-10-CM | POA: Diagnosis not present

## 2023-10-11 DIAGNOSIS — N1832 Chronic kidney disease, stage 3b: Secondary | ICD-10-CM | POA: Diagnosis not present

## 2023-10-11 DIAGNOSIS — E7800 Pure hypercholesterolemia, unspecified: Secondary | ICD-10-CM | POA: Diagnosis not present

## 2023-10-23 ENCOUNTER — Other Ambulatory Visit: Payer: Self-pay | Admitting: Neurology

## 2023-10-23 DIAGNOSIS — Z8673 Personal history of transient ischemic attack (TIA), and cerebral infarction without residual deficits: Secondary | ICD-10-CM | POA: Diagnosis not present

## 2023-10-23 DIAGNOSIS — I5032 Chronic diastolic (congestive) heart failure: Secondary | ICD-10-CM | POA: Diagnosis not present

## 2023-10-23 DIAGNOSIS — I130 Hypertensive heart and chronic kidney disease with heart failure and stage 1 through stage 4 chronic kidney disease, or unspecified chronic kidney disease: Secondary | ICD-10-CM | POA: Diagnosis not present

## 2023-10-23 DIAGNOSIS — N1832 Chronic kidney disease, stage 3b: Secondary | ICD-10-CM | POA: Diagnosis not present

## 2023-10-23 DIAGNOSIS — G301 Alzheimer's disease with late onset: Secondary | ICD-10-CM | POA: Diagnosis not present

## 2023-10-23 DIAGNOSIS — I420 Dilated cardiomyopathy: Secondary | ICD-10-CM | POA: Diagnosis not present

## 2023-10-23 DIAGNOSIS — Z7984 Long term (current) use of oral hypoglycemic drugs: Secondary | ICD-10-CM | POA: Diagnosis not present

## 2023-10-23 DIAGNOSIS — M48061 Spinal stenosis, lumbar region without neurogenic claudication: Secondary | ICD-10-CM | POA: Diagnosis not present

## 2023-10-23 DIAGNOSIS — H548 Legal blindness, as defined in USA: Secondary | ICD-10-CM | POA: Diagnosis not present

## 2023-10-23 DIAGNOSIS — Z7902 Long term (current) use of antithrombotics/antiplatelets: Secondary | ICD-10-CM | POA: Diagnosis not present

## 2023-10-23 DIAGNOSIS — E1122 Type 2 diabetes mellitus with diabetic chronic kidney disease: Secondary | ICD-10-CM | POA: Diagnosis not present

## 2023-10-23 DIAGNOSIS — Z993 Dependence on wheelchair: Secondary | ICD-10-CM | POA: Diagnosis not present

## 2023-10-23 DIAGNOSIS — E7800 Pure hypercholesterolemia, unspecified: Secondary | ICD-10-CM | POA: Diagnosis not present

## 2023-10-23 DIAGNOSIS — Z7951 Long term (current) use of inhaled steroids: Secondary | ICD-10-CM | POA: Diagnosis not present

## 2023-10-23 DIAGNOSIS — K219 Gastro-esophageal reflux disease without esophagitis: Secondary | ICD-10-CM | POA: Diagnosis not present

## 2023-11-04 DIAGNOSIS — N1832 Chronic kidney disease, stage 3b: Secondary | ICD-10-CM | POA: Diagnosis not present

## 2023-11-04 DIAGNOSIS — I420 Dilated cardiomyopathy: Secondary | ICD-10-CM | POA: Diagnosis not present

## 2023-11-04 DIAGNOSIS — I5032 Chronic diastolic (congestive) heart failure: Secondary | ICD-10-CM | POA: Diagnosis not present

## 2023-11-04 DIAGNOSIS — J42 Unspecified chronic bronchitis: Secondary | ICD-10-CM | POA: Diagnosis not present

## 2023-11-15 ENCOUNTER — Emergency Department (HOSPITAL_COMMUNITY)

## 2023-11-15 ENCOUNTER — Ambulatory Visit (HOSPITAL_COMMUNITY)
Admission: EM | Admit: 2023-11-15 | Discharge: 2023-11-16 | Disposition: A | Discharge status: Home / Self Care | Attending: Internal Medicine | Admitting: Internal Medicine

## 2023-11-15 ENCOUNTER — Other Ambulatory Visit: Payer: Self-pay

## 2023-11-15 DIAGNOSIS — I5032 Chronic diastolic (congestive) heart failure: Secondary | ICD-10-CM | POA: Diagnosis not present

## 2023-11-15 DIAGNOSIS — R0789 Other chest pain: Secondary | ICD-10-CM | POA: Diagnosis not present

## 2023-11-15 DIAGNOSIS — F0390 Unspecified dementia without behavioral disturbance: Secondary | ICD-10-CM | POA: Diagnosis not present

## 2023-11-15 DIAGNOSIS — H547 Unspecified visual loss: Secondary | ICD-10-CM | POA: Diagnosis not present

## 2023-11-15 DIAGNOSIS — T182XXA Foreign body in stomach, initial encounter: Secondary | ICD-10-CM | POA: Insufficient documentation

## 2023-11-15 DIAGNOSIS — W44F3XA Food entering into or through a natural orifice, initial encounter: Secondary | ICD-10-CM | POA: Insufficient documentation

## 2023-11-15 DIAGNOSIS — T18128A Food in esophagus causing other injury, initial encounter: Secondary | ICD-10-CM | POA: Diagnosis not present

## 2023-11-15 DIAGNOSIS — E119 Type 2 diabetes mellitus without complications: Secondary | ICD-10-CM | POA: Diagnosis not present

## 2023-11-15 DIAGNOSIS — R079 Chest pain, unspecified: Secondary | ICD-10-CM | POA: Diagnosis not present

## 2023-11-15 DIAGNOSIS — R112 Nausea with vomiting, unspecified: Secondary | ICD-10-CM | POA: Diagnosis not present

## 2023-11-15 DIAGNOSIS — I110 Hypertensive heart disease with heart failure: Secondary | ICD-10-CM | POA: Diagnosis not present

## 2023-11-15 DIAGNOSIS — R1110 Vomiting, unspecified: Secondary | ICD-10-CM

## 2023-11-15 LAB — CBC
HCT: 36 % — ABNORMAL LOW (ref 39.0–52.0)
Hemoglobin: 10.9 g/dL — ABNORMAL LOW (ref 13.0–17.0)
MCH: 24.6 pg — ABNORMAL LOW (ref 26.0–34.0)
MCHC: 30.3 g/dL (ref 30.0–36.0)
MCV: 81.3 fL (ref 80.0–100.0)
Platelets: 172 K/uL (ref 150–400)
RBC: 4.43 MIL/uL (ref 4.22–5.81)
RDW: 15.3 % (ref 11.5–15.5)
WBC: 7.2 K/uL (ref 4.0–10.5)
nRBC: 0 % (ref 0.0–0.2)

## 2023-11-15 LAB — I-STAT CHEM 8, ED
BUN: 21 mg/dL (ref 8–23)
Calcium, Ion: 1.07 mmol/L — ABNORMAL LOW (ref 1.15–1.40)
Chloride: 106 mmol/L (ref 98–111)
Creatinine, Ser: 2 mg/dL — ABNORMAL HIGH (ref 0.61–1.24)
Glucose, Bld: 123 mg/dL — ABNORMAL HIGH (ref 70–99)
HCT: 37 % — ABNORMAL LOW (ref 39.0–52.0)
Hemoglobin: 12.6 g/dL — ABNORMAL LOW (ref 13.0–17.0)
Potassium: 3.8 mmol/L (ref 3.5–5.1)
Sodium: 142 mmol/L (ref 135–145)
TCO2: 26 mmol/L (ref 22–32)

## 2023-11-15 LAB — BASIC METABOLIC PANEL WITH GFR
Anion gap: 12 (ref 5–15)
BUN: 17 mg/dL (ref 8–23)
CO2: 26 mmol/L (ref 22–32)
Calcium: 9 mg/dL (ref 8.9–10.3)
Chloride: 104 mmol/L (ref 98–111)
Creatinine, Ser: 1.81 mg/dL — ABNORMAL HIGH (ref 0.61–1.24)
GFR, Estimated: 34 mL/min — ABNORMAL LOW (ref >60)
Glucose, Bld: 126 mg/dL — ABNORMAL HIGH (ref 70–99)
Potassium: 3.8 mmol/L (ref 3.5–5.1)
Sodium: 142 mmol/L (ref 135–145)

## 2023-11-15 LAB — TROPONIN I (HIGH SENSITIVITY): Troponin I (High Sensitivity): 17 ng/L (ref <18)

## 2023-11-15 MED ORDER — ONDANSETRON HCL 4 MG/2ML IJ SOLN
4.0000 mg | Freq: Once | INTRAMUSCULAR | Status: AC
  Administered 2023-11-15: 4 mg via INTRAVENOUS

## 2023-11-15 MED ORDER — ONDANSETRON HCL 4 MG/2ML IJ SOLN
INTRAMUSCULAR | Status: AC
  Filled 2023-11-15: qty 2

## 2023-11-15 NOTE — ED Triage Notes (Signed)
 Pt arrived with spouse for vomiting and chest tightness. Hx of dementia. Coughing up thick phlegm.   IV placed to R hand

## 2023-11-15 NOTE — ED Triage Notes (Signed)
 Pt has had been vomiting for past hour. Pt's wife states pt said he has chest pain with the vomiting & that he has dementia really bad.

## 2023-11-16 ENCOUNTER — Encounter (HOSPITAL_COMMUNITY)
Admission: EM | Disposition: A | Discharge status: Home / Self Care | Payer: Self-pay | Source: Home / Self Care | Attending: Emergency Medicine

## 2023-11-16 ENCOUNTER — Inpatient Hospital Stay (HOSPITAL_COMMUNITY): Admitting: Certified Registered Nurse Anesthetist

## 2023-11-16 ENCOUNTER — Encounter (HOSPITAL_COMMUNITY): Payer: Self-pay | Admitting: Internal Medicine

## 2023-11-16 ENCOUNTER — Emergency Department (HOSPITAL_COMMUNITY)

## 2023-11-16 DIAGNOSIS — I5032 Chronic diastolic (congestive) heart failure: Secondary | ICD-10-CM

## 2023-11-16 DIAGNOSIS — T18108A Unspecified foreign body in esophagus causing other injury, initial encounter: Secondary | ICD-10-CM | POA: Diagnosis not present

## 2023-11-16 DIAGNOSIS — E1122 Type 2 diabetes mellitus with diabetic chronic kidney disease: Secondary | ICD-10-CM | POA: Diagnosis not present

## 2023-11-16 DIAGNOSIS — N1832 Chronic kidney disease, stage 3b: Secondary | ICD-10-CM

## 2023-11-16 DIAGNOSIS — T18128A Food in esophagus causing other injury, initial encounter: Secondary | ICD-10-CM

## 2023-11-16 DIAGNOSIS — I130 Hypertensive heart and chronic kidney disease with heart failure and stage 1 through stage 4 chronic kidney disease, or unspecified chronic kidney disease: Secondary | ICD-10-CM

## 2023-11-16 DIAGNOSIS — R109 Unspecified abdominal pain: Secondary | ICD-10-CM | POA: Diagnosis not present

## 2023-11-16 DIAGNOSIS — K8020 Calculus of gallbladder without cholecystitis without obstruction: Secondary | ICD-10-CM | POA: Diagnosis not present

## 2023-11-16 DIAGNOSIS — N3289 Other specified disorders of bladder: Secondary | ICD-10-CM | POA: Diagnosis not present

## 2023-11-16 DIAGNOSIS — R079 Chest pain, unspecified: Secondary | ICD-10-CM | POA: Diagnosis not present

## 2023-11-16 DIAGNOSIS — T182XXA Foreign body in stomach, initial encounter: Secondary | ICD-10-CM | POA: Diagnosis not present

## 2023-11-16 HISTORY — PX: ESOPHAGOGASTRODUODENOSCOPY: SHX5428

## 2023-11-16 LAB — BRAIN NATRIURETIC PEPTIDE: B Natriuretic Peptide: 89.4 pg/mL (ref 0.0–100.0)

## 2023-11-16 LAB — TROPONIN I (HIGH SENSITIVITY): Troponin I (High Sensitivity): 18 ng/L — ABNORMAL HIGH (ref <18)

## 2023-11-16 SURGERY — EGD (ESOPHAGOGASTRODUODENOSCOPY)
Anesthesia: General

## 2023-11-16 MED ORDER — IOHEXOL 350 MG/ML SOLN
75.0000 mL | Freq: Once | INTRAVENOUS | Status: AC | PRN
  Administered 2023-11-16: 75 mL via INTRAVENOUS

## 2023-11-16 MED ORDER — ESMOLOL HCL 100 MG/10ML IV SOLN
INTRAVENOUS | Status: DC | PRN
  Administered 2023-11-16: 20 mg via INTRAVENOUS

## 2023-11-16 MED ORDER — HEPARIN SODIUM (PORCINE) 5000 UNIT/ML IJ SOLN: 5000.0000 [IU] | Freq: Three times a day (TID) | INTRAMUSCULAR | Status: DC

## 2023-11-16 MED ORDER — GLYCOPYRROLATE 0.2 MG/ML IJ SOLN
INTRAMUSCULAR | Status: DC | PRN
  Administered 2023-11-16: .1 mg via INTRAVENOUS

## 2023-11-16 MED ORDER — SUCCINYLCHOLINE CHLORIDE 200 MG/10ML IV SOSY
PREFILLED_SYRINGE | INTRAVENOUS | Status: DC | PRN
  Administered 2023-11-16: 120 mg via INTRAVENOUS

## 2023-11-16 MED ORDER — ONDANSETRON HCL 4 MG PO TABS: 4.0000 mg | ORAL_TABLET | Freq: Four times a day (QID) | ORAL | Status: DC | PRN

## 2023-11-16 MED ORDER — LIDOCAINE HCL (CARDIAC) PF 100 MG/5ML IV SOSY
PREFILLED_SYRINGE | INTRAVENOUS | Status: DC | PRN
  Administered 2023-11-16: 100 mg via INTRAVENOUS

## 2023-11-16 MED ORDER — METHYLPREDNISOLONE SODIUM SUCC 125 MG IJ SOLR
125.0000 mg | INTRAMUSCULAR | Status: AC
  Administered 2023-11-16: 125 mg via INTRAVENOUS
  Filled 2023-11-16: qty 2

## 2023-11-16 MED ORDER — PANTOPRAZOLE SODIUM 40 MG IV SOLR: 40.0000 mg | INTRAVENOUS | Status: DC

## 2023-11-16 MED ORDER — PROPOFOL 10 MG/ML IV BOLUS
INTRAVENOUS | Status: DC | PRN
  Administered 2023-11-16: 80 mg via INTRAVENOUS

## 2023-11-16 MED ORDER — ONDANSETRON HCL 4 MG/2ML IJ SOLN: 4.0000 mg | Freq: Once | INTRAMUSCULAR | Status: DC | PRN

## 2023-11-16 MED ORDER — TIMOLOL MALEATE 0.25 % OP SOLN: 1.0000 [drp] | Freq: Every day | OPHTHALMIC | Status: DC

## 2023-11-16 MED ORDER — ALBUTEROL SULFATE (2.5 MG/3ML) 0.083% IN NEBU: 2.5000 mg | INHALATION_SOLUTION | RESPIRATORY_TRACT | Status: DC | PRN

## 2023-11-16 MED ORDER — ONDANSETRON HCL 4 MG/2ML IJ SOLN: 4.0000 mg | Freq: Four times a day (QID) | INTRAMUSCULAR | Status: DC | PRN

## 2023-11-16 MED ORDER — IPRATROPIUM-ALBUTEROL 0.5-2.5 (3) MG/3ML IN SOLN
3.0000 mL | Freq: Once | RESPIRATORY_TRACT | Status: AC
  Administered 2023-11-16: 3 mL via RESPIRATORY_TRACT
  Filled 2023-11-16: qty 3

## 2023-11-16 MED ORDER — FENTANYL CITRATE (PF) 100 MCG/2ML IJ SOLN: 25.0000 ug | INTRAMUSCULAR | Status: DC | PRN

## 2023-11-16 MED ORDER — ONDANSETRON HCL 4 MG/2ML IJ SOLN
INTRAMUSCULAR | Status: DC | PRN
  Administered 2023-11-16: 4 mg via INTRAVENOUS

## 2023-11-16 MED ORDER — LATANOPROST 0.005 % OP SOLN: 1.0000 [drp] | Freq: Every day | OPHTHALMIC | Status: DC

## 2023-11-16 MED ORDER — INSULIN ASPART 100 UNIT/ML IJ SOLN: 0.0000 [IU] | INTRAMUSCULAR | Status: DC

## 2023-11-16 MED ORDER — SODIUM CHLORIDE 0.9 % IV SOLN: INTRAVENOUS | Status: DC

## 2023-11-16 NOTE — Progress Notes (Signed)
 Per Dr. Saintclair, okay to discharge patient home from PACU.  Dr. Debby with [facility] and patient placement made aware.

## 2023-11-16 NOTE — ED Provider Notes (Signed)
 [place] [facility] Provider Note   CSN: [id number] Arrival date & time: 11/15/23  2021     Patient presents with: Emesis and Chest Pain   Javis Abboud. is a 89 y.o. male with medical history to include dementia, blindness, diabetes, glaucoma, hypertension, chest pain with high risk for cardiac etiology, rhabdomyolysis, nonischemic cardiomyopathy, chronic diastolic CHF.  Patient presents to ED for evaluation.  Patient with wife who provides majority of the history.  Per wife, the patient was in his usual state of health until this evening around 6 PM.  Patient wife reports that patient began having spit up, she reports that this spit up is white and foamy.  She reports that the patient began complaining of chest pain after he began vomiting.  She denies any fevers at home.  She reports that patient is urinating more frequently and his blood sugar is elevated at 173.  She reports that he has had a bowel movement today but is experiencing constipation over the last 1 month which she is giving her MiraLAX for.  She goes on to report that the patient has had right lower extremity edema and leg swelling for the last 1 month which she has seen PCP about.  On exam, the patient reports he has a little bit of pain in his abdomen.  He reports a little bit of shortness of breath.  The patient wife at the bedside does report patient has history of asthma however this is not in his chart.  Patient has dementia and the patient wife reports patient is at his baseline mental status.   Emesis Chest Pain      Prior to Admission medications   Medication Sig Start Date End Date Taking? Authorizing Provider  acetaminophen  (TYLENOL ) 325 MG tablet Take 2 tablets (650 mg total) by mouth every 4 (four) hours as needed for mild pain (or temp > 37.5 C (99.5 F)). 04/18/22  Yes Angiulli, Toribio PARAS, PA-C  citalopram  (CELEXA ) 10 MG tablet Take 10 mg by mouth daily. 10/31/22  Yes  [provider]  clopidogrel  (PLAVIX ) 75 MG tablet Take 1 tablet (75 mg total) by mouth daily. 04/18/22  Yes Angiulli, Toribio PARAS, PA-C  donepezil  (ARICEPT ) 10 MG tablet TAKE 1 TABLET BY MOUTH AT BEDTIME 03/19/23  Yes Sethi, Pramod S, MD  furosemide  (LASIX ) 40 MG tablet Take 1 tablet (40 mg total) by mouth daily. 11/14/22 11/15/24 Yes Arrien, Elidia Toribio, MD  glimepiride  (AMARYL ) 1 MG tablet Take 1 tablet (1 mg total) by mouth every morning. Patient taking differently: Take 1 mg by mouth in the morning and at bedtime. 04/18/22  Yes Angiulli, Toribio PARAS, PA-C  hydrALAZINE  (APRESOLINE ) 10 MG tablet Take 1 tablet (10 mg total) by mouth in the morning and at bedtime. 11/14/22 11/15/24 Yes Arrien, Elidia Toribio, MD  ipratropium-albuterol  (DUONEB) 0.5-2.5 (3) MG/3ML SOLN Take 3 mLs by nebulization every 6 (six) hours as needed (SOB). 12/09/21  Yes [provider]  isosorbide  mononitrate (IMDUR ) 30 MG 24 hr tablet Take 1 tablet (30 mg total) by mouth daily. 04/19/22  Yes Angiulli, Toribio PARAS, PA-C  latanoprost  (XALATAN ) 0.005 % ophthalmic solution Place 1 drop into both eyes daily. 10/29/23  Yes [provider]  pantoprazole  (PROTONIX ) 40 MG tablet Take 1 tablet (40 mg total) by mouth daily. 04/18/22 11/15/24 Yes Angiulli, Toribio PARAS, PA-C  rosuvastatin  (CRESTOR ) 20 MG tablet Take 1 tablet (20 mg total) by mouth daily. 04/18/22  Yes Angiulli,  Toribio PARAS, PA-C  timolol  (TIMOPTIC ) 0.25 % ophthalmic solution Place 1 drop into both eyes daily. 10/30/23  Yes [provider]  traZODone  (DESYREL ) 50 MG tablet Take 75 mg by mouth at bedtime as needed for sleep (1/2 tablet as needed).   Yes [provider]    Allergies: Lisinopril, Penicillin g, and Penicillins    Review of Systems  Unable to perform ROS: Dementia (Level 5 caveat)  All other systems reviewed and are negative.   Updated Vital Signs BP (!) 135/57   Pulse 83   Temp (!) 97.2 F (36.2 C) (Axillary)   Resp 17   Ht  5' 2 (1.575 m)   Wt 80.3 kg   SpO2 100%   BMI 32.37 kg/m   Physical Exam Vitals and nursing note reviewed.  Constitutional:      General: He is not in acute distress.    Appearance: He is well-developed.  HENT:     Head: Normocephalic and atraumatic.  Eyes:     Conjunctiva/sclera: Conjunctivae normal.  Cardiovascular:     Rate and Rhythm: Normal rate and regular rhythm.     Heart sounds: No murmur heard. Pulmonary:     Effort: Pulmonary effort is normal. No respiratory distress.     Breath sounds: Wheezing present.  Abdominal:     Palpations: Abdomen is soft.     Tenderness: There is no abdominal tenderness.  Musculoskeletal:        General: No swelling.     Cervical back: Neck supple.  Skin:    General: Skin is warm and dry.     Capillary Refill: Capillary refill takes less than 2 seconds.  Neurological:     Mental Status: He is alert. Mental status is at baseline.  Psychiatric:        Mood and Affect: Mood normal.     (all labs ordered are listed, but only abnormal results are displayed) Labs Reviewed  BASIC METABOLIC PANEL WITH GFR - Abnormal; Notable for the following components:      Result Value   Glucose, Bld 126 (*)    Creatinine, Ser 1.81 (*)    GFR, Estimated 34 (*)    All other components within normal limits  CBC - Abnormal; Notable for the following components:   Hemoglobin 10.9 (*)    HCT 36.0 (*)    MCH 24.6 (*)    All other components within normal limits  I-STAT CHEM 8, ED - Abnormal; Notable for the following components:   Creatinine, Ser 2.00 (*)    Glucose, Bld 123 (*)    Calcium , Ion 1.07 (*)    Hemoglobin 12.6 (*)    HCT 37.0 (*)    All other components within normal limits  TROPONIN I (HIGH SENSITIVITY) - Abnormal; Notable for the following components:   Troponin I (High Sensitivity) 18 (*)    All other components within normal limits  BRAIN NATRIURETIC PEPTIDE  HEMOGLOBIN A1C  CBC  COMPREHENSIVE METABOLIC PANEL WITH GFR   TROPONIN I (HIGH SENSITIVITY)    EKG: EKG Interpretation Date/Time:  Thursday November 15 2023 20:46:33 EDT Ventricular Rate:  61 PR Interval:  256 QRS Duration:  174 QT Interval:  482 QTC Calculation: 485 R Axis:   -70  Text Interpretation: Sinus rhythm with 1st degree A-V block Right bundle branch block Left anterior fascicular block Bifascicular block Left ventricular hypertrophy with repolarization abnormality ( R in aVL ) Cannot rule out Septal infarct , age undetermined Abnormal ECG When  compared with ECG of 03-Jan-2023 17:17, No acute changes Confirmed by Haze Lonni PARAS [phone]) on 11/16/2023 12:38:49 AM  Radiology: CT CHEST ABDOMEN PELVIS W CONTRAST Result Date: 11/16/2023 EXAM: CT CHEST, ABDOMEN AND PELVIS WITH CONTRAST 11/16/2023 01:10:09 AM TECHNIQUE: CT of the chest, abdomen and pelvis was performed with the administration of intravenous contrast (75mL iohexol  (OMNIPAQUE ) 350 MG/ML injection). Multiplanar reformatted images are provided for review. Automated exposure control, iterative reconstruction, and/or weight based adjustment of the mA/kV was utilized to reduce the radiation dose to as low as reasonably achievable. COMPARISON: X-ray 11/15/2023 CLINICAL HISTORY: Concern for volume overload in chest, patient with abdominal pain as well nonfocal in nature, patient with dementia and unable to provide much history. FINDINGS: CHEST: MEDIASTINUM AND LYMPH NODES: Heart and pericardium are unremarkable. The central airways are clear. No mediastinal, hilar or axillary lymphadenopathy. LUNGS AND PLEURA: No focal consolidation or pulmonary edema. No pleural effusion or pneumothorax. ABDOMEN AND PELVIS: LIVER: The liver is unremarkable. GALLBLADDER AND BILE DUCTS: Cholelithiasis without evidence of acute cholecystitis. SPLEEN: No acute abnormality. PANCREAS: No acute abnormality. ADRENAL GLANDS: No acute abnormality. KIDNEYS, URETERS AND BLADDER: No stones in the kidneys or ureters. No  hydronephrosis. No perinephric or periureteral stranding. Asymmetric left bladder wall thickening. This may be due to the degree of distention. Consider cystoscopy to rule out malignancy. GI AND BOWEL: Stomach demonstrates no acute abnormality. There is no bowel obstruction. Fluid in the esophagus reaching the thoracic inlet. Mild diffuse esophageal wall thickening. REPRODUCTIVE ORGANS: No acute abnormality. PERITONEUM AND RETROPERITONEUM: No ascites. No free air. VASCULATURE: Aorta is normal in caliber. ABDOMINAL AND PELVIS LYMPH NODES: No lymphadenopathy. REPRODUCTIVE ORGANS: No acute abnormality. BONES AND SOFT TISSUES: No acute osseous abnormality. No focal soft tissue abnormality. IMPRESSION: 1. Fluid column in the esophagus reaching the thoracic inlet, which is an aspiration risk. Diffuse esophageal wall thickening compatible with esophagitis. 2. Cholelithiasis without evidence of acute cholecystitis. 3. Asymmetric left bladder wall thickening, possibly due to degree of distention. Consider cystoscopy to rule out malignancy. Electronically signed by: Norman Gatlin MD 11/16/2023 01:20 AM EDT RP Workstation: [id number]   DG Chest 1 View Result Date: 11/15/2023 CLINICAL DATA:  Chest pain EXAM: CHEST  1 VIEW COMPARISON:  01/03/2023 FINDINGS: Heart and mediastinal contours are within normal limits. No focal opacities or effusions. No acute bony abnormality. IMPRESSION: No active disease. Electronically Signed   By: Franky Crease M.D.   On: 11/15/2023 21:15    Procedures   Medications Ordered in the ED  pantoprazole  (PROTONIX ) injection 40 mg ( Intravenous Automatically Held 11/24/23 0315)  latanoprost  (XALATAN ) 0.005 % ophthalmic solution 1 drop (has no administration in time range)  timolol  (TIMOPTIC ) 0.25 % ophthalmic solution 1 drop (has no administration in time range)  insulin  aspart (novoLOG ) injection 0-6 Units (has no administration in time range)  heparin  injection 5,000 Units (has no  administration in time range)  0.9 %  sodium chloride  infusion (has no administration in time range)  ondansetron  (ZOFRAN ) tablet 4 mg (has no administration in time range)    Or  ondansetron  (ZOFRAN ) injection 4 mg (has no administration in time range)  albuterol  (PROVENTIL ) (2.5 MG/3ML) 0.083% nebulizer solution 2.5 mg (has no administration in time range)  ondansetron  (ZOFRAN ) injection 4 mg (4 mg Intravenous Given 11/15/23 2056)  ipratropium-albuterol  (DUONEB) 0.5-2.5 (3) MG/3ML nebulizer solution 3 mL (3 mLs Nebulization Given 11/16/23 0225)  methylPREDNISolone  sodium succinate (SOLU-MEDROL ) 125 mg/2 mL injection 125 mg (125 mg Intravenous Given 11/16/23 0225)  iohexol  (OMNIPAQUE ) 350 MG/ML injection 75 mL (75 mLs Intravenous Contrast Given 11/16/23 0059)    Clinical Course as of 11/16/23 0334  Fri Nov 16, 2023  0200 Patient was fed [place] steak and cabbage prior to symptom onset [CG]  0231 Dr. Dwight with [facility] PCP [CG]    Clinical Course User Index [CG] Ruthell Lonni FALCON, PA-C   Medical Decision Making Amount and/or Complexity of Data Reviewed Labs: ordered. Radiology: ordered.  Risk Prescription drug management. Decision regarding hospitalization.   This is a 89 year old gentleman presenting to the ED with his wife for evaluation of nausea and vomiting.  On exam, the patient is hemodynamically stable.  Afebrile and nontachycardic.  Lung sounds with slight wheezing bilaterally, no hypoxia.  Abdomen soft and compressible.  Neuroexam at baseline.   Labs drawn on this patient to include CBC, BMP, troponin x 2, i-STAT Chem-8, BNP, chest x-ray.  Also added on CT chest abdomen pelvis.  CBC without leukocytosis, hemoglobin 12.6.  Troponins 17, delta 18.  EKG nonischemic.  BNP 89.4.  Chest x-ray unremarkable.  CT chest abdomen pelvis collected.  CT chest shows fluid: In the esophagus reaching thoracic outlet with diffuse esophageal wall thickening concerning for  esophagitis.  Concern for food impaction in this patient.  Discussed with on-call GI doctor Dr. Saintclair who advises will take patient for endoscopy.  Patient wife at bedside amenable to plan.   Final diagnoses:  Food impaction of esophagus, initial encounter  Vomiting, unspecified vomiting type, unspecified whether nausea present    ED Discharge Orders     None          Ruthell Lonni FALCON, PA-C 11/16/23 0336    Haze Lonni PARAS, MD 11/19/23 0009

## 2023-11-16 NOTE — Anesthesia Preprocedure Evaluation (Signed)
 Anesthesia Evaluation  Patient identified by MRN, date of birth, ID band Patient awake    Reviewed: Allergy & Precautions, NPO status , Patient's Chart, lab work & pertinent test results  Airway Mallampati: II  TM Distance: >3 FB Neck ROM: Full    Dental  (+) Dental Advisory Given, Edentulous Upper, Edentulous Lower   Pulmonary neg pulmonary ROS   Pulmonary exam normal breath sounds clear to auscultation       Cardiovascular hypertension, +CHF  Normal cardiovascular exam Rhythm:Regular Rate:Normal     Neuro/Psych  PSYCHIATRIC DISORDERS     Dementia Blindness  CVA    GI/Hepatic negative GI ROS, Neg liver ROS,,,  Endo/Other  diabetes, Type 2    Renal/GU Renal InsufficiencyRenal disease     Musculoskeletal negative musculoskeletal ROS (+)    Abdominal   Peds  Hematology  (+) Blood dyscrasia, anemia   Anesthesia Other Findings Day of surgery medications reviewed with the patient.  Reproductive/Obstetrics                              Anesthesia Physical Anesthesia Plan  ASA: 3 and emergent  Anesthesia Plan: General   Post-op Pain Management:    Induction: Intravenous  PONV Risk Score and Plan: 2 and Dexamethasone and Ondansetron   Airway Management Planned: Oral ETT  Additional Equipment:   Intra-op Plan:   Post-operative Plan: Extubation in OR  Informed Consent: I have reviewed the patients History and Physical, chart, labs and discussed the procedure including the risks, benefits and alternatives for the proposed anesthesia with the patient or authorized representative who has indicated his/her understanding and acceptance.     Dental advisory given  Plan Discussed with: CRNA  Anesthesia Plan Comments:          Anesthesia Quick Evaluation

## 2023-11-16 NOTE — Op Note (Signed)
 [facility] Patient Name: Grant Jensen Procedure Date : 11/16/2023 MRN: [id number] Attending MD: Estelita Manas , MD, [id number] Date of Birth: 06-29-1928 CSN: [id number] Age: 89 Admit Type: Inpatient Procedure:                Upper GI endoscopy Indications:              Foreign body in the esophagus Providers:                Estelita Manas, MD, Jacquelyn Jaci Pierce, RN, Fairy Marina, Technician Referring MD:             ER Medicines:                Monitored Anesthesia Care Complications:            No immediate complications. Estimated blood loss:                            Minimal. Estimated Blood Loss:     Estimated blood loss was minimal. Procedure:                Pre-Anesthesia Assessment:                           - Prior to the procedure, a History and Physical                            was performed, and patient medications and                            allergies were reviewed. The patient's tolerance of                            previous anesthesia was also reviewed. The risks                            and benefits of the procedure and the sedation                            options and risks were discussed with the patient.                            All questions were answered, and informed consent                            was obtained. Prior Anticoagulants: The patient has                            taken Plavix  (clopidogrel ), last dose was 1 day                            prior to procedure. ASA Grade Assessment: E -  Emergency. After reviewing the risks and benefits,                            the patient was deemed in satisfactory condition to                            undergo the procedure.                           After obtaining informed consent, the endoscope was                            passed under direct vision. Throughout the                            procedure, the patient's blood  pressure, pulse, and                            oxygen saturations were monitored continuously. The                            [id number]-[id number] ([id number]) Olympus endoscope was introduced                            through the mouth, and advanced to the second part                            of duodenum. The upper GI endoscopy was                            accomplished without difficulty. The patient                            tolerated the procedure well. Scope In: Scope Out: Findings:      Impacted food/meat(about 2 inch in size) was found in the middle third       of the esophagus. Removal was accomplished with a Roth net.      A medium amount of food (residue) was found in the cardia and in the       gastric fundus.      A medium amount of food (residue) was found at the pylorus.      The examined duodenum was normal. Impression:               - Food in the middle third of the esophagus.                            Removal was successful.                           - A medium amount of food (residue) in the stomach.                           - A medium amount of food (residue) in the stomach.                           -  Normal examined duodenum. Moderate Sedation:      Patient did not receive moderate sedation for this procedure, but       instead received monitored anesthesia care. Recommendation:           - Patient has a contact number available for                            emergencies. The signs and symptoms of potential                            delayed complications were discussed with the                            patient. Return to normal activities tomorrow.                            Written discharge instructions were provided to the                            patient.                           - Mechanical soft diet.                           - Continue present medications. Procedure Code(s):        --- Professional ---                           [phone],  Esophagogastroduodenoscopy, flexible,                            transoral; with removal of foreign body(s) Diagnosis Code(s):        --- Professional ---                           U81.871J, Food in esophagus causing other injury,                            initial encounter                           T18.2XXA, Foreign body in stomach, initial encounter                           T18.108A, Unspecified foreign body in esophagus                            causing other injury, initial encounter CPT copyright 2022 [employer]. All rights reserved. The codes documented in this report are preliminary and upon coder review may  be revised to meet current compliance requirements. Estelita Manas, MD 11/16/2023 4:18:22 AM This report has been signed electronically. Number of Addenda: 0

## 2023-11-16 NOTE — Transfer of Care (Signed)
 Immediate Anesthesia Transfer of Care Note  Patient: Grant Jensen.  Procedure(s) Performed: EGD (ESOPHAGOGASTRODUODENOSCOPY)  Patient Location: PACU  Anesthesia Type:General  Level of Consciousness: drowsy  Airway & Oxygen Therapy: Patient Spontanous Breathing and Patient connected to nasal cannula oxygen  Post-op Assessment: Report given to RN, Post -op Vital signs reviewed and stable, and Patient moving all extremities  Post vital signs: Reviewed and stable  Last Vitals:  Vitals Value Taken Time  BP 130/80 11/16/23 04:30  Temp    Pulse 102 11/16/23 04:33  Resp 15 11/16/23 04:33  SpO2 96 % 11/16/23 04:33  Vitals shown include unfiled device data.  Last Pain:  Vitals:   11/16/23 0324  TempSrc: Temporal         Complications: No notable events documented.

## 2023-11-16 NOTE — H&P (Signed)
 Grant Jensen. is an 89 y.o. male.   Chief Complaint: Dysphagia, esophageal food bolus impaction  HPI: 89 year old male with dementia and blindness presented to the ER with inability to eat or drink anything after he had steak and cabbage for dinner at 6 PM.  He has been spitting up fluid since 6 PM and came to the ER where he had a CAT scan which showed fluid column in his esophagus and risk of aspiration, clinical picture compatible with esophageal food bolus impaction.  Past Medical History:  Diagnosis Date   Blindness    Dementia (HCC)    Diabetes mellitus without complication (HCC)    Glaucoma    Hypertension     Past Surgical History:  Procedure Laterality Date   LEFT HEART CATHETERIZATION WITH CORONARY ANGIOGRAM N/A 06/22/2014   Procedure: LEFT HEART CATHETERIZATION WITH CORONARY ANGIOGRAM;  Surgeon: Gordy Bergamo, MD;  Location: [facility] CATH LAB;  Service: Cardiovascular;  Laterality: N/A;    History reviewed. No pertinent family history. Social History:  reports that he has never smoked. He does not have any smokeless tobacco history on file. He reports that he does not currently use alcohol. He reports that he does not currently use drugs.  Allergies:  Allergies  Allergen Reactions   Lisinopril Cough   Penicillin G Hives and Rash   Penicillins Rash    Medications Prior to Admission  Medication Sig Dispense Refill   acetaminophen  (TYLENOL ) 325 MG tablet Take 2 tablets (650 mg total) by mouth every 4 (four) hours as needed for mild pain (or temp > 37.5 C (99.5 F)).     citalopram  (CELEXA ) 10 MG tablet Take 10 mg by mouth daily.     clopidogrel  (PLAVIX ) 75 MG tablet Take 1 tablet (75 mg total) by mouth daily. 30 tablet 0   donepezil  (ARICEPT ) 10 MG tablet TAKE 1 TABLET BY MOUTH AT BEDTIME 30 tablet 2   furosemide  (LASIX ) 40 MG tablet Take 1 tablet (40 mg total) by mouth daily. 30 tablet 0   glimepiride  (AMARYL ) 1 MG tablet Take 1 tablet (1 mg total) by mouth every morning.  (Patient taking differently: Take 1 mg by mouth in the morning and at bedtime.) 30 tablet 0   hydrALAZINE  (APRESOLINE ) 10 MG tablet Take 1 tablet (10 mg total) by mouth in the morning and at bedtime. 60 tablet 0   ipratropium-albuterol  (DUONEB) 0.5-2.5 (3) MG/3ML SOLN Take 3 mLs by nebulization every 6 (six) hours as needed (SOB).     isosorbide  mononitrate (IMDUR ) 30 MG 24 hr tablet Take 1 tablet (30 mg total) by mouth daily. 30 tablet 0   latanoprost  (XALATAN ) 0.005 % ophthalmic solution Place 1 drop into both eyes daily.     pantoprazole  (PROTONIX ) 40 MG tablet Take 1 tablet (40 mg total) by mouth daily. 30 tablet 0   rosuvastatin  (CRESTOR ) 20 MG tablet Take 1 tablet (20 mg total) by mouth daily. 30 tablet 0   timolol  (TIMOPTIC ) 0.25 % ophthalmic solution Place 1 drop into both eyes daily.     traZODone  (DESYREL ) 50 MG tablet Take 75 mg by mouth at bedtime as needed for sleep (1/2 tablet as needed).      Results for orders placed or performed during the hospital encounter of 11/15/23 (from the past 48 hours)  Basic metabolic panel     Status: Abnormal   Collection Time: 11/15/23  8:58 PM  Result Value Ref Range   Sodium 142 135 - 145 mmol/L  Potassium 3.8 3.5 - 5.1 mmol/L   Chloride 104 98 - 111 mmol/L   CO2 26 22 - 32 mmol/L   Glucose, Bld 126 (H) 70 - 99 mg/dL    Comment: Glucose reference range applies only to samples taken after fasting for at least 8 hours.   BUN 17 8 - 23 mg/dL   Creatinine, Ser 8.18 (H) 0.61 - 1.24 mg/dL   Calcium  9.0 8.9 - 10.3 mg/dL   GFR, Estimated 34 (L) >60 mL/min    Comment: (NOTE) Calculated using the CKD-EPI Creatinine Equation (2021)    Anion gap 12 5 - 15    Comment: Performed at [facility], [address], [place], [place] [address]  CBC     Status: Abnormal   Collection Time: 11/15/23  8:58 PM  Result Value Ref Range   WBC 7.2 4.0 - 10.5 K/uL   RBC 4.43 4.22 - 5.81 MIL/uL   Hemoglobin 10.9 (L) 13.0 - 17.0 g/dL   HCT 63.9 (L) 60.9 -  52.0 %   MCV 81.3 80.0 - 100.0 fL   MCH 24.6 (L) 26.0 - 34.0 pg   MCHC 30.3 30.0 - 36.0 g/dL   RDW 84.6 88.4 - 84.4 %   Platelets 172 150 - 400 K/uL   nRBC 0.0 0.0 - 0.2 %    Comment: Performed at [facility], [address] [place], [place] [address]  Troponin I (High Sensitivity)     Status: None   Collection Time: 11/15/23  8:58 PM  Result Value Ref Range   Troponin I (High Sensitivity) 17 <18 ng/L    Comment: (NOTE) Elevated high sensitivity troponin I (hsTnI) values and significant  changes across serial measurements may suggest ACS but many other  chronic and acute conditions are known to elevate hsTnI results.  Refer to the Links section for chest pain algorithms and additional  guidance. Performed at [facility], [address], [place], [place] [address]   I-stat chem 8, ED (not at [facility], [facility] or [facility])     Status: Abnormal   Collection Time: 11/15/23  8:58 PM  Result Value Ref Range   Sodium 142 135 - 145 mmol/L   Potassium 3.8 3.5 - 5.1 mmol/L   Chloride 106 98 - 111 mmol/L   BUN 21 8 - 23 mg/dL   Creatinine, Ser 7.99 (H) 0.61 - 1.24 mg/dL   Glucose, Bld 876 (H) 70 - 99 mg/dL    Comment: Glucose reference range applies only to samples taken after fasting for at least 8 hours.   Calcium , Ion 1.07 (L) 1.15 - 1.40 mmol/L   TCO2 26 22 - 32 mmol/L   Hemoglobin 12.6 (L) 13.0 - 17.0 g/dL   HCT 62.9 (L) 60.9 - 47.9 %  Troponin I (High Sensitivity)     Status: Abnormal   Collection Time: 11/16/23  1:21 AM  Result Value Ref Range   Troponin I (High Sensitivity) 18 (H) <18 ng/L    Comment: (NOTE) Elevated high sensitivity troponin I (hsTnI) values and significant  changes across serial measurements may suggest ACS but many other  chronic and acute conditions are known to elevate hsTnI results.  Refer to the Links section for chest pain algorithms and additional  guidance. Performed at [facility], [address] [place], [place] [address]   Brain  natriuretic peptide     Status: None   Collection Time: 11/16/23  1:21 AM  Result Value Ref  Range   B Natriuretic Peptide 89.4 0.0 - 100.0 pg/mL    Comment: Performed at [facility], [address] [place], [place] [address]   CT CHEST ABDOMEN PELVIS W CONTRAST Result Date: 11/16/2023 EXAM: CT CHEST, ABDOMEN AND PELVIS WITH CONTRAST 11/16/2023 01:10:09 AM TECHNIQUE: CT of the chest, abdomen and pelvis was performed with the administration of intravenous contrast (75mL iohexol  (OMNIPAQUE ) 350 MG/ML injection). Multiplanar reformatted images are provided for review. Automated exposure control, iterative reconstruction, and/or weight based adjustment of the mA/kV was utilized to reduce the radiation dose to as low as reasonably achievable. COMPARISON: X-ray 11/15/2023 CLINICAL HISTORY: Concern for volume overload in chest, patient with abdominal pain as well nonfocal in nature, patient with dementia and unable to provide much history. FINDINGS: CHEST: MEDIASTINUM AND LYMPH NODES: Heart and pericardium are unremarkable. The central airways are clear. No mediastinal, hilar or axillary lymphadenopathy. LUNGS AND PLEURA: No focal consolidation or pulmonary edema. No pleural effusion or pneumothorax. ABDOMEN AND PELVIS: LIVER: The liver is unremarkable. GALLBLADDER AND BILE DUCTS: Cholelithiasis without evidence of acute cholecystitis. SPLEEN: No acute abnormality. PANCREAS: No acute abnormality. ADRENAL GLANDS: No acute abnormality. KIDNEYS, URETERS AND BLADDER: No stones in the kidneys or ureters. No hydronephrosis. No perinephric or periureteral stranding. Asymmetric left bladder wall thickening. This may be due to the degree of distention. Consider cystoscopy to rule out malignancy. GI AND BOWEL: Stomach demonstrates no acute abnormality. There is no bowel obstruction. Fluid in the esophagus reaching the thoracic inlet. Mild diffuse esophageal wall thickening. REPRODUCTIVE ORGANS: No acute abnormality.  PERITONEUM AND RETROPERITONEUM: No ascites. No free air. VASCULATURE: Aorta is normal in caliber. ABDOMINAL AND PELVIS LYMPH NODES: No lymphadenopathy. REPRODUCTIVE ORGANS: No acute abnormality. BONES AND SOFT TISSUES: No acute osseous abnormality. No focal soft tissue abnormality. IMPRESSION: 1. Fluid column in the esophagus reaching the thoracic inlet, which is an aspiration risk. Diffuse esophageal wall thickening compatible with esophagitis. 2. Cholelithiasis without evidence of acute cholecystitis. 3. Asymmetric left bladder wall thickening, possibly due to degree of distention. Consider cystoscopy to rule out malignancy. Electronically signed by: Norman Gatlin MD 11/16/2023 01:20 AM EDT RP Workstation: [id number]   DG Chest 1 View Result Date: 11/15/2023 CLINICAL DATA:  Chest pain EXAM: CHEST  1 VIEW COMPARISON:  01/03/2023 FINDINGS: Heart and mediastinal contours are within normal limits. No focal opacities or effusions. No acute bony abnormality. IMPRESSION: No active disease. Electronically Signed   By: Franky Crease M.D.   On: 11/15/2023 21:15    Review of Systems  Constitutional:  Positive for appetite change.  Gastrointestinal:  Positive for nausea and vomiting.    Blood pressure (!) 135/57, pulse 83, temperature (!) 97.1 F (36.2 C), temperature source Temporal, resp. rate 17, height 5' 2 (1.575 m), weight 80.3 kg, SpO2 98%. Physical Exam HENT:     Head: Normocephalic and atraumatic.  Cardiovascular:     Rate and Rhythm: Normal rate and regular rhythm.  Pulmonary:     Effort: Pulmonary effort is normal.  Skin:    General: Skin is warm and dry.      Assessment/Plan Esophageal food bolus impaction Emergent EGD with propofol  now  Grant Manas, MD 11/16/2023, 3:39 AM

## 2023-11-16 NOTE — Anesthesia Postprocedure Evaluation (Signed)
 Anesthesia Post Note  Patient: Grant Jensen.  Procedure(s) Performed: EGD (ESOPHAGOGASTRODUODENOSCOPY)     Patient location during evaluation: PACU Anesthesia Type: General Level of consciousness: awake and alert Pain management: pain level controlled Vital Signs Assessment: post-procedure vital signs reviewed and stable Respiratory status: spontaneous breathing, nonlabored ventilation and respiratory function stable Cardiovascular status: blood pressure returned to baseline and stable Postop Assessment: no apparent nausea or vomiting Anesthetic complications: no   No notable events documented.  Last Vitals:  Vitals:   11/16/23 0430 11/16/23 0445  BP: 130/80 106/64  Pulse: 100 88  Resp: 16 (!) 23  Temp: (!) 36.2 C   SpO2: 100% 97%    Last Pain:  Vitals:   11/16/23 0430  TempSrc:   PainSc: 0-No pain                 Garnette FORBES Skillern

## 2023-11-16 NOTE — Anesthesia Procedure Notes (Signed)
 Procedure Name: Intubation Date/Time: 11/16/2023 3:56 AM  Performed by: Thecla Forgione T, CRNAPre-anesthesia Checklist: Patient identified, Emergency Drugs available, Suction available and Patient being monitored Patient Re-evaluated:Patient Re-evaluated prior to induction Oxygen Delivery Method: Circle system utilized Preoxygenation: Pre-oxygenation with 100% oxygen Induction Type: IV induction, Rapid sequence and Cricoid Pressure applied Ventilation: Mask ventilation without difficulty Laryngoscope Size: Mac and 4 Grade View: Grade I Tube type: Oral Tube size: 7.0 mm Number of attempts: 1 Airway Equipment and Method: Stylet and Oral airway Placement Confirmation: ETT inserted through vocal cords under direct vision, positive ETCO2 and breath sounds checked- equal and bilateral Secured at: 22 cm Tube secured with: Tape Dental Injury: Teeth and Oropharynx as per pre-operative assessment

## 2023-11-19 ENCOUNTER — Encounter (HOSPITAL_COMMUNITY): Payer: Self-pay | Admitting: Gastroenterology

## 2023-11-20 DIAGNOSIS — Z7902 Long term (current) use of antithrombotics/antiplatelets: Secondary | ICD-10-CM | POA: Diagnosis not present

## 2023-11-20 DIAGNOSIS — Z7984 Long term (current) use of oral hypoglycemic drugs: Secondary | ICD-10-CM | POA: Diagnosis not present

## 2023-11-20 DIAGNOSIS — Z993 Dependence on wheelchair: Secondary | ICD-10-CM | POA: Diagnosis not present

## 2023-11-20 DIAGNOSIS — H548 Legal blindness, as defined in USA: Secondary | ICD-10-CM | POA: Diagnosis not present

## 2023-11-20 DIAGNOSIS — E7800 Pure hypercholesterolemia, unspecified: Secondary | ICD-10-CM | POA: Diagnosis not present

## 2023-11-20 DIAGNOSIS — G301 Alzheimer's disease with late onset: Secondary | ICD-10-CM | POA: Diagnosis not present

## 2023-11-20 DIAGNOSIS — Z7951 Long term (current) use of inhaled steroids: Secondary | ICD-10-CM | POA: Diagnosis not present

## 2023-11-24 ENCOUNTER — Other Ambulatory Visit: Payer: Self-pay

## 2023-11-24 ENCOUNTER — Emergency Department (HOSPITAL_BASED_OUTPATIENT_CLINIC_OR_DEPARTMENT_OTHER)

## 2023-11-24 ENCOUNTER — Encounter (HOSPITAL_BASED_OUTPATIENT_CLINIC_OR_DEPARTMENT_OTHER): Payer: Self-pay

## 2023-11-24 ENCOUNTER — Emergency Department (HOSPITAL_BASED_OUTPATIENT_CLINIC_OR_DEPARTMENT_OTHER)
Admission: EM | Admit: 2023-11-24 | Discharge: 2023-11-24 | Disposition: A | Discharge status: Home / Self Care | Attending: Emergency Medicine | Admitting: Emergency Medicine

## 2023-11-24 DIAGNOSIS — I10 Essential (primary) hypertension: Secondary | ICD-10-CM | POA: Diagnosis not present

## 2023-11-24 DIAGNOSIS — R739 Hyperglycemia, unspecified: Secondary | ICD-10-CM

## 2023-11-24 DIAGNOSIS — Z79899 Other long term (current) drug therapy: Secondary | ICD-10-CM | POA: Insufficient documentation

## 2023-11-24 DIAGNOSIS — E1165 Type 2 diabetes mellitus with hyperglycemia: Secondary | ICD-10-CM | POA: Diagnosis not present

## 2023-11-24 DIAGNOSIS — Z7984 Long term (current) use of oral hypoglycemic drugs: Secondary | ICD-10-CM | POA: Insufficient documentation

## 2023-11-24 DIAGNOSIS — Z7902 Long term (current) use of antithrombotics/antiplatelets: Secondary | ICD-10-CM | POA: Insufficient documentation

## 2023-11-24 DIAGNOSIS — F0390 Unspecified dementia without behavioral disturbance: Secondary | ICD-10-CM | POA: Diagnosis not present

## 2023-11-24 LAB — BASIC METABOLIC PANEL WITH GFR
Anion gap: 13 (ref 5–15)
BUN: 18 mg/dL (ref 8–23)
CO2: 26 mmol/L (ref 22–32)
Calcium: 9.3 mg/dL (ref 8.9–10.3)
Chloride: 100 mmol/L (ref 98–111)
Creatinine, Ser: 1.61 mg/dL — ABNORMAL HIGH (ref 0.61–1.24)
GFR, Estimated: 40 mL/min — ABNORMAL LOW (ref >60)
Glucose, Bld: 196 mg/dL — ABNORMAL HIGH (ref 70–99)
Potassium: 3.9 mmol/L (ref 3.5–5.1)
Sodium: 138 mmol/L (ref 135–145)

## 2023-11-24 LAB — CBC WITH DIFFERENTIAL/PLATELET
Abs Immature Granulocytes: 0.02 K/uL (ref 0.00–0.07)
Basophils Absolute: 0 K/uL (ref 0.0–0.1)
Basophils Relative: 0 %
Eosinophils Absolute: 0.4 K/uL (ref 0.0–0.5)
Eosinophils Relative: 5 %
HCT: 37 % — ABNORMAL LOW (ref 39.0–52.0)
Hemoglobin: 11.5 g/dL — ABNORMAL LOW (ref 13.0–17.0)
Immature Granulocytes: 0 %
Lymphocytes Relative: 52 %
Lymphs Abs: 3.7 K/uL (ref 0.7–4.0)
MCH: 24.8 pg — ABNORMAL LOW (ref 26.0–34.0)
MCHC: 31.1 g/dL (ref 30.0–36.0)
MCV: 79.7 fL — ABNORMAL LOW (ref 80.0–100.0)
Monocytes Absolute: 0.7 K/uL (ref 0.1–1.0)
Monocytes Relative: 10 %
Neutro Abs: 2.4 K/uL (ref 1.7–7.7)
Neutrophils Relative %: 33 %
Platelets: 184 K/uL (ref 150–400)
RBC: 4.64 MIL/uL (ref 4.22–5.81)
RDW: 14.8 % (ref 11.5–15.5)
WBC: 7.2 K/uL (ref 4.0–10.5)
nRBC: 0 % (ref 0.0–0.2)

## 2023-11-24 LAB — CBG MONITORING, ED: Glucose-Capillary: 209 mg/dL — ABNORMAL HIGH (ref 70–99)

## 2023-11-24 NOTE — ED Provider Notes (Addendum)
  EMERGENCY DEPARTMENT AT [facility] Provider Note   CSN: [id number] Arrival date & time: 11/24/23  1747     Patient presents with: Hyperglycemia   Grant Jensen. is a 89 y.o. male.   Patient brought in for blood sugars being high for couple weeks.  Patient is on glimepiride .  Has been taking it as usual.  Has not had known history of diabetes.  Has not been on insulin .  Patient does have primary care doctor but did have difficulty getting into see him.  Stating that sometimes blood sugars 300.  Here today blood sugar 196.  Fingerstick was 209.  Patient does get around with a walker.  Still able to do that.  Does need assistance getting up and down.  Family member here with him.  Patient is blind secondary to glaucoma has diabetes history of dementia and hypertension.  Never used tobacco.  No fevers no nausea vomiting.  No abdominal pain.       Prior to Admission medications   Medication Sig Start Date End Date Taking? Authorizing Provider  acetaminophen  (TYLENOL ) 325 MG tablet Take 2 tablets (650 mg total) by mouth every 4 (four) hours as needed for mild pain (or temp > 37.5 C (99.5 F)). 04/18/22   Angiulli, Toribio PARAS, PA-C  citalopram  (CELEXA ) 10 MG tablet Take 10 mg by mouth daily. 10/31/22   [provider]  clopidogrel  (PLAVIX ) 75 MG tablet Take 1 tablet (75 mg total) by mouth daily. 04/18/22   Angiulli, Toribio PARAS, PA-C  donepezil  (ARICEPT ) 10 MG tablet TAKE 1 TABLET BY MOUTH AT BEDTIME 03/19/23   Sethi, Pramod S, MD  furosemide  (LASIX ) 40 MG tablet Take 1 tablet (40 mg total) by mouth daily. 11/14/22 11/15/24  Arrien, Elidia Toribio, MD  glimepiride  (AMARYL ) 1 MG tablet Take 1 tablet (1 mg total) by mouth every morning. Patient taking differently: Take 1 mg by mouth in the morning and at bedtime. 04/18/22   Angiulli, Toribio PARAS, PA-C  hydrALAZINE  (APRESOLINE ) 10 MG tablet Take 1 tablet (10 mg total) by mouth in the morning and at bedtime. 11/14/22 11/15/24   Arrien, Mauricio Daniel, MD  ipratropium-albuterol  (DUONEB) 0.5-2.5 (3) MG/3ML SOLN Take 3 mLs by nebulization every 6 (six) hours as needed (SOB). 12/09/21   [provider]  isosorbide  mononitrate (IMDUR ) 30 MG 24 hr tablet Take 1 tablet (30 mg total) by mouth daily. 04/19/22   Angiulli, Toribio PARAS, PA-C  latanoprost  (XALATAN ) 0.005 % ophthalmic solution Place 1 drop into both eyes daily. 10/29/23   [provider]  pantoprazole  (PROTONIX ) 40 MG tablet Take 1 tablet (40 mg total) by mouth daily. 04/18/22 11/15/24  Angiulli, Toribio PARAS, PA-C  rosuvastatin  (CRESTOR ) 20 MG tablet Take 1 tablet (20 mg total) by mouth daily. 04/18/22   Angiulli, Toribio PARAS, PA-C  timolol  (TIMOPTIC ) 0.25 % ophthalmic solution Place 1 drop into both eyes daily. 10/30/23   [provider]  traZODone  (DESYREL ) 50 MG tablet Take 75 mg by mouth at bedtime as needed for sleep (1/2 tablet as needed).    [provider]    Allergies: Lisinopril, Penicillin g, and Penicillins    Review of Systems  Constitutional:  Negative for chills and fever.  HENT:  Negative for ear pain and sore throat.   Eyes:  Positive for visual disturbance. Negative for pain.  Respiratory:  Negative for cough and shortness of breath.   Cardiovascular:  Negative for chest pain and palpitations.  Gastrointestinal:  Negative  for abdominal pain and vomiting.  Genitourinary:  Negative for dysuria and hematuria.  Musculoskeletal:  Negative for arthralgias and back pain.  Skin:  Negative for color change and rash.  Neurological:  Negative for seizures and syncope.  All other systems reviewed and are negative.   Updated Vital Signs BP 135/67   Pulse 60   Temp 98.1 F (36.7 C) (Temporal)   Resp 16   Ht 1.575 m (5' 2)   Wt 80.7 kg   SpO2 100%   BMI 32.56 kg/m   Physical Exam Vitals and nursing note reviewed.  Constitutional:      General: He is not in acute distress.    Appearance: Normal appearance. He is  well-developed.  HENT:     Head: Normocephalic and atraumatic.  Eyes:     Conjunctiva/sclera: Conjunctivae normal.     Comments: Patient blind bilaterally  Cardiovascular:     Rate and Rhythm: Normal rate and regular rhythm.     Heart sounds: No murmur heard. Pulmonary:     Effort: Pulmonary effort is normal. No respiratory distress.     Breath sounds: Normal breath sounds.  Abdominal:     General: There is no distension.     Palpations: Abdomen is soft.     Tenderness: There is no abdominal tenderness. There is no guarding.  Musculoskeletal:        General: No swelling.     Cervical back: Normal range of motion and neck supple. No rigidity.  Skin:    General: Skin is warm and dry.     Capillary Refill: Capillary refill takes less than 2 seconds.  Neurological:     Mental Status: He is alert. Mental status is at baseline.  Psychiatric:        Mood and Affect: Mood normal.     (all labs ordered are listed, but only abnormal results are displayed) Labs Reviewed  CBC WITH DIFFERENTIAL/PLATELET - Abnormal; Notable for the following components:      Result Value   Hemoglobin 11.5 (*)    HCT 37.0 (*)    MCV 79.7 (*)    MCH 24.8 (*)    All other components within normal limits  BASIC METABOLIC PANEL WITH GFR - Abnormal; Notable for the following components:   Glucose, Bld 196 (*)    Creatinine, Ser 1.61 (*)    GFR, Estimated 40 (*)    All other components within normal limits  CBG MONITORING, ED - Abnormal; Notable for the following components:   Glucose-Capillary 209 (*)    All other components within normal limits  URINALYSIS, ROUTINE W REFLEX MICROSCOPIC    EKG: None  Radiology: [facility] Chest Port 1 View Result Date: 11/24/2023 CLINICAL DATA:  Hyperglycemia EXAM: PORTABLE CHEST 1 VIEW COMPARISON:  11/15/2023 FINDINGS: Heart and mediastinal contours are within normal limits. No focal opacities or effusions. No acute bony abnormality. IMPRESSION: No active disease.  Electronically Signed   By: Franky Crease M.D.   On: 11/24/2023 19:26     Procedures   Medications Ordered in the ED - No data to display                                  Medical Decision Making Amount and/or Complexity of Data Reviewed Labs: ordered. Radiology: ordered.   Patient's labs here basic metabolic panel GFR is 40 creatinine 1.61 blood sugar 196 CO2 26.  Chest x-ray negative CBC  white count 7.4 hemoglobin 11.5 platelets 185.  Patient not able to provide urine but there was no really concerned about urinary tract infection.  Patient's GFR is not significantly changed from baseline.  Patient blood sugars here are pretty good without any intervention.  Will recommend close follow-up with primary care doctor.  Final diagnoses:  Hyperglycemia    ED Discharge Orders     None          Geraldene Hamilton, MD 11/24/23 7856    Geraldene Hamilton, MD 11/24/23 2144

## 2023-11-24 NOTE — ED Triage Notes (Signed)
 Complaining of high blood sugar for a couple of weeks. Is not on insulin . Only takes glimepiride .

## 2023-11-24 NOTE — Discharge Instructions (Addendum)
 Recommend close follow-up with primary care doctor to monitor the blood sugars.  Return for any new or worse symptoms.

## 2023-11-25 DIAGNOSIS — R739 Hyperglycemia, unspecified: Secondary | ICD-10-CM | POA: Diagnosis not present

## 2023-12-04 DIAGNOSIS — I5032 Chronic diastolic (congestive) heart failure: Secondary | ICD-10-CM | POA: Diagnosis not present

## 2023-12-04 DIAGNOSIS — J42 Unspecified chronic bronchitis: Secondary | ICD-10-CM | POA: Diagnosis not present

## 2023-12-04 DIAGNOSIS — N1832 Chronic kidney disease, stage 3b: Secondary | ICD-10-CM | POA: Diagnosis not present

## 2023-12-04 DIAGNOSIS — I420 Dilated cardiomyopathy: Secondary | ICD-10-CM | POA: Diagnosis not present

## 2023-12-12 DIAGNOSIS — R1319 Other dysphagia: Secondary | ICD-10-CM | POA: Diagnosis not present

## 2023-12-12 DIAGNOSIS — I10 Essential (primary) hypertension: Secondary | ICD-10-CM | POA: Diagnosis not present

## 2023-12-12 DIAGNOSIS — Z23 Encounter for immunization: Secondary | ICD-10-CM | POA: Diagnosis not present

## 2023-12-12 DIAGNOSIS — E7800 Pure hypercholesterolemia, unspecified: Secondary | ICD-10-CM | POA: Diagnosis not present

## 2023-12-12 DIAGNOSIS — N1832 Chronic kidney disease, stage 3b: Secondary | ICD-10-CM | POA: Diagnosis not present

## 2023-12-12 DIAGNOSIS — E1122 Type 2 diabetes mellitus with diabetic chronic kidney disease: Secondary | ICD-10-CM | POA: Diagnosis not present

## 2024-02-06 NOTE — Patient Outreach (Signed)
 RN CM received the following message from Grant Jensen with [facility] requesting assistance connecting patient with care navigator through C-SNP plan:   Patients wife states that patient has been falling more lately. Patient has fallen 5 times total this month and 3 times on Thanksgiving. Patients wife states he needs total assistance at this point to prevent a fall. She states that patient has not hit his head on any of the falls but he has not been seen anywhere following any of the falls either. She needs assistance with his transfers to and from wheelchair including showers which is where one of the falls occured.  I have left a message for the wife that you would be calling. It looks like he may have had palliative care previously?  Spoke with Grant Jensen, patient's wife. 3-way call started to [facility] Navigator line. Spoke with Grant Jensen, care navigator. With patient's C-SNP plan, he does not have a Care Navigator assigned to him. Advised to contact member services at [phone]. Redirected to different departments several times. Advised to contact [phone] to request additional resources for house calls. They have scheduled patient for house call visit 02/14/24 between 2-6 PM.  Grant Finlay, RN MSN [place]  [facility] RN Care Manager Direct Dial: [phone]  Fax: [phone]
